# Patient Record
Sex: Female | Born: 1990 | Race: Black or African American | Hispanic: No | Marital: Single | Smoking: Former smoker
Health system: Southern US, Community
[De-identification: ages and names within clinical notes are randomized; demographics above are authoritative.]

## PROBLEM LIST (undated history)

## (undated) DIAGNOSIS — R87629 Unspecified abnormal cytological findings in specimens from vagina: Secondary | ICD-10-CM

## (undated) DIAGNOSIS — O2340 Unspecified infection of urinary tract in pregnancy, unspecified trimester: Secondary | ICD-10-CM

## (undated) DIAGNOSIS — A64 Unspecified sexually transmitted disease: Secondary | ICD-10-CM

## (undated) DIAGNOSIS — IMO0002 Reserved for concepts with insufficient information to code with codable children: Secondary | ICD-10-CM

## (undated) HISTORY — DX: Unspecified abnormal cytological findings in specimens from vagina: R87.629

## (undated) HISTORY — DX: Unspecified sexually transmitted disease: A64

## (undated) HISTORY — PX: OTHER SURGICAL HISTORY: SHX169

## (undated) HISTORY — DX: Reserved for concepts with insufficient information to code with codable children: IMO0002

## (undated) HISTORY — PX: BREAST BIOPSY: SHX20

---

## 2010-03-31 ENCOUNTER — Emergency Department (HOSPITAL_COMMUNITY)
Admission: EM | Admit: 2010-03-31 | Discharge: 2010-03-31 | Disposition: A | Discharge status: Home / Self Care | Payer: No Typology Code available for payment source | Attending: Emergency Medicine | Admitting: Emergency Medicine

## 2010-03-31 ENCOUNTER — Emergency Department (HOSPITAL_COMMUNITY): Payer: No Typology Code available for payment source

## 2010-03-31 DIAGNOSIS — R51 Headache: Secondary | ICD-10-CM | POA: Insufficient documentation

## 2010-03-31 DIAGNOSIS — S6390XA Sprain of unspecified part of unspecified wrist and hand, initial encounter: Secondary | ICD-10-CM | POA: Insufficient documentation

## 2010-03-31 DIAGNOSIS — M79609 Pain in unspecified limb: Secondary | ICD-10-CM | POA: Insufficient documentation

## 2010-03-31 DIAGNOSIS — S8000XA Contusion of unspecified knee, initial encounter: Secondary | ICD-10-CM | POA: Insufficient documentation

## 2010-03-31 DIAGNOSIS — Y929 Unspecified place or not applicable: Secondary | ICD-10-CM | POA: Insufficient documentation

## 2010-03-31 DIAGNOSIS — IMO0002 Reserved for concepts with insufficient information to code with codable children: Secondary | ICD-10-CM | POA: Insufficient documentation

## 2010-03-31 DIAGNOSIS — S0990XA Unspecified injury of head, initial encounter: Secondary | ICD-10-CM | POA: Insufficient documentation

## 2010-03-31 DIAGNOSIS — M25569 Pain in unspecified knee: Secondary | ICD-10-CM | POA: Insufficient documentation

## 2010-03-31 LAB — POCT PREGNANCY, URINE: Preg Test, Ur: NEGATIVE

## 2011-01-13 DIAGNOSIS — IMO0002 Reserved for concepts with insufficient information to code with codable children: Secondary | ICD-10-CM

## 2011-01-13 HISTORY — DX: Reserved for concepts with insufficient information to code with codable children: IMO0002

## 2011-01-13 HISTORY — PX: COLPOSCOPY: SHX161

## 2011-01-28 ENCOUNTER — Encounter: Payer: Self-pay | Admitting: Gynecology

## 2011-01-28 ENCOUNTER — Ambulatory Visit (INDEPENDENT_AMBULATORY_CARE_PROVIDER_SITE_OTHER): Payer: BC Managed Care – PPO | Admitting: Gynecology

## 2011-01-28 VITALS — BP 128/74 | Ht 67.0 in | Wt 185.0 lb

## 2011-01-28 DIAGNOSIS — R87810 Cervical high risk human papillomavirus (HPV) DNA test positive: Secondary | ICD-10-CM

## 2011-01-28 DIAGNOSIS — N912 Amenorrhea, unspecified: Secondary | ICD-10-CM

## 2011-01-28 DIAGNOSIS — N951 Menopausal and female climacteric states: Secondary | ICD-10-CM

## 2011-01-28 DIAGNOSIS — IMO0001 Reserved for inherently not codable concepts without codable children: Secondary | ICD-10-CM

## 2011-01-28 DIAGNOSIS — R87610 Atypical squamous cells of undetermined significance on cytologic smear of cervix (ASC-US): Secondary | ICD-10-CM

## 2011-01-28 NOTE — Patient Instructions (Signed)
Office will call you in several days with biopsy results and hormone levels. You do not hear from the office call in 5 days. Assuming biopsy shows low-grade changes and follow up in 6 months for annual exam and Pap smear

## 2011-01-28 NOTE — Progress Notes (Signed)
Patient ID: Brooke Howell, female   DOB: 1990-04-14, 21 y.o.   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 She apparently was told to repeat her Pap smear in 6 months But she was nervous with this and presents for a second opinion. They did a full exam and I had paperwork showing a complete physical exam with STD screening at that time. Patient has been using Depo-Provera historically for 7 years and is amenorrheic. She does note of last month or so some hot flashes that come and go throughout the day. No weight changes hair or skin changes or other symptoms.  Exam with Sherri chaperone present HEENT normal Abdomen soft nontender without masses guarding rebound organomegaly Pelvic external BUS vagina normal. Cervix normal. Uterus normal size axial midline mobile nontender. Adnexa without masses or tenderness  Colposcopy Acetic acid cleanse, adequate although external os somewhat stenotic with a faint acetowhite change at 12:00 transformation zone. Biopsy at 12:00 taken with subsequent ECC performed after gentle external os dilatation with anterior lip stabilization with a single tooth tenaculum.  Patient tolerated well  Physical Exam  Genitourinary:      Assessment and plan: 1. 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 Colposcopy consistent with HPV changes.  Patient will follow for biopsy results. Assuming negative for low-grade changes then we'll plan management with Pap smear in 6 months. She'll be due for her annual exam at that point and she will come in for that.  I had a long discussion with her about dysplasia, high-grade low-grade, progression  Regression and the positive high risk HPV. 2. Depo-Provera contraception. I discussed prolonged Depo-Provera contraception and the black box FDA warning with risk of loss of calcium from the bones and potential long-term  issues with osteopenia/osteoporosis. Options for DEXA baseline was also discussed or switching to a different form of contraception. The patient started interest he would prefer to continue on Depo-Provera and she understands and accepts the risks of long-term bone loss weight against the risk of getting pregnant now and the health risks associated with that as well as the social issues. 3. Hot flushes of short duration. We'll check baseline FSH TSH hCG. Assuming negative she'll monitor if they resolve will follow if they would persist I'll refer to internal medicine for evaluation.

## 2011-01-29 LAB — HCG, SERUM, QUALITATIVE: Preg, Serum: NEGATIVE

## 2011-05-29 ENCOUNTER — Encounter: Payer: Self-pay | Admitting: Family Medicine

## 2011-05-29 ENCOUNTER — Ambulatory Visit (INDEPENDENT_AMBULATORY_CARE_PROVIDER_SITE_OTHER): Payer: BC Managed Care – PPO | Admitting: Family Medicine

## 2011-05-29 VITALS — BP 127/86 | HR 98 | Temp 98.3°F | Resp 16 | Ht 68.0 in | Wt 188.8 lb

## 2011-05-29 DIAGNOSIS — L03119 Cellulitis of unspecified part of limb: Secondary | ICD-10-CM

## 2011-05-29 DIAGNOSIS — L02619 Cutaneous abscess of unspecified foot: Secondary | ICD-10-CM

## 2011-05-29 DIAGNOSIS — B353 Tinea pedis: Secondary | ICD-10-CM

## 2011-05-29 MED ORDER — KETOCONAZOLE 200 MG PO TABS: 200.0000 mg | ORAL_TABLET | Freq: Every day | ORAL | Status: DC

## 2011-05-29 MED ORDER — DOXYCYCLINE HYCLATE 100 MG PO TABS: 100.0000 mg | ORAL_TABLET | Freq: Two times a day (BID) | ORAL | Status: AC

## 2011-05-29 NOTE — Patient Instructions (Signed)
Athlete's Foot Athlete's foot (tinea pedis) is a fungal infection of the skin on the feet. It often occurs on the skin between the toes or underneath the toes. It can also occur on the soles of the feet. Athlete's foot is more likely to occur in hot, humid weather. Not washing your feet or changing your socks often enough can contribute to athlete's foot. The infection can spread from person to person (contagious). CAUSES Athlete's foot is caused by a fungus. This fungus thrives in warm, moist places. Most people get athlete's foot by sharing shower stalls, towels, and wet floors with an infected person. People with weakened immune systems, including those with diabetes, may be more likely to get athlete's foot. SYMPTOMS   Itchy areas between the toes or on the soles of the feet.   White, flaky, or scaly areas between the toes or on the soles of the feet.   Tiny, intensely itchy blisters between the toes or on the soles of the feet.   Tiny cuts on the skin. These cuts can develop a bacterial infection.   Thick or discolored toenails.  DIAGNOSIS  Your caregiver can usually tell what the problem is by doing a physical exam. Your caregiver may also take a skin sample from the rash area. The skin sample may be examined under a microscope, or it may be tested to see if fungus will grow in the sample. A sample may also be taken from your toenail for testing. TREATMENT  Over-the-counter and prescription medicines can be used to kill the fungus. These medicines are available as powders or creams. Your caregiver can suggest medicines for you. Fungal infections respond slowly to treatment. You may need to continue using your medicine for several weeks. PREVENTION   Do not share towels.   Wear sandals in wet areas, such as shared locker rooms and shared showers.   Keep your feet dry. Wear shoes that allow air to circulate. Wear cotton or wool socks.  HOME CARE INSTRUCTIONS   Take medicines as  directed by your caregiver. Do not use steroid creams on athlete's foot.   Keep your feet clean and cool. Wash your feet daily and dry them thoroughly, especially between your toes.   Change your socks every day. Wear cotton or wool socks. In hot climates, you may need to change your socks 2 to 3 times per day.   Wear sandals or canvas tennis shoes with good air circulation.   If you have blisters, soak your feet in Burow's solution or Epsom salts for 20 to 30 minutes, 2 times a day to dry out the blisters. Make sure you dry your feet thoroughly afterward.  SEEK MEDICAL CARE IF:   You have a fever.   You have swelling, soreness, warmth, or redness in your foot.   You are not getting better after 7 days of treatment.   You are not completely cured after 30 days.   You have any problems caused by your medicines.  MAKE SURE YOU:   Understand these instructions.   Will watch your condition.   Will get help right away if you are not doing well or get worse.  Document Released: 12/27/1999 Document Revised: 12/18/2010 Document Reviewed: 10/17/2010 Exit[employer] Patient Information 2012 [employer]. 

## 2011-05-29 NOTE — Progress Notes (Signed)
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  Now the foot has gotten much worse.  Objective: The fifth toe webspace on the right is crusted with white exudate it is very tender. There is surrounding erythema and swelling in the distal foot on the lateral side. She has good range of motion.  Assessment: Tinea pedis with bacterial cellulitis.  Plan: Nizoral 200 daily, doxycycline 100 twice a day x7 days. Patient was instructed to return in 48 hours if pain is and swelling are not significantly improved.

## 2011-06-07 ENCOUNTER — Telehealth: Payer: Self-pay

## 2011-06-07 NOTE — Telephone Encounter (Signed)
PATIENT WAS SEEN LAST Friday.  SHE WAS GIVEN AN ANTIBIOTIC FOR INFECTION IN HER FOOT AND NOW SHE HAS A HOLE IN BETWEEN HER TOES.  WANTS TO KNOW IF IT IS SUPPOSED TO BE LIKE THAT?

## 2011-06-09 NOTE — Telephone Encounter (Signed)
LMOM (OK per HIPPA) advising pt to RTC to have her wound evaluated. Asked for CB w/any further ?s.

## 2011-07-31 ENCOUNTER — Ambulatory Visit (INDEPENDENT_AMBULATORY_CARE_PROVIDER_SITE_OTHER): Payer: BC Managed Care – PPO | Admitting: Gynecology

## 2011-07-31 ENCOUNTER — Other Ambulatory Visit (HOSPITAL_COMMUNITY)
Admission: RE | Admit: 2011-07-31 | Discharge: 2011-07-31 | Disposition: A | Discharge status: Home / Self Care | Payer: BC Managed Care – PPO | Source: Ambulatory Visit | Attending: Gynecology | Admitting: Gynecology

## 2011-07-31 ENCOUNTER — Encounter: Payer: Self-pay | Admitting: Gynecology

## 2011-07-31 VITALS — BP 120/76 | Ht 66.0 in | Wt 187.0 lb

## 2011-07-31 DIAGNOSIS — N898 Other specified noninflammatory disorders of vagina: Secondary | ICD-10-CM

## 2011-07-31 DIAGNOSIS — Z01419 Encounter for gynecological examination (general) (routine) without abnormal findings: Secondary | ICD-10-CM

## 2011-07-31 DIAGNOSIS — B373 Candidiasis of vulva and vagina: Secondary | ICD-10-CM

## 2011-07-31 DIAGNOSIS — Z113 Encounter for screening for infections with a predominantly sexual mode of transmission: Secondary | ICD-10-CM

## 2011-07-31 DIAGNOSIS — R87811 Vaginal high risk human papillomavirus (HPV) DNA test positive: Secondary | ICD-10-CM

## 2011-07-31 DIAGNOSIS — A64 Unspecified sexually transmitted disease: Secondary | ICD-10-CM | POA: Insufficient documentation

## 2011-07-31 DIAGNOSIS — IMO0002 Reserved for concepts with insufficient information to code with codable children: Secondary | ICD-10-CM | POA: Insufficient documentation

## 2011-07-31 LAB — WET PREP FOR TRICH, YEAST, CLUE: Clue Cells Wet Prep HPF POC: NONE SEEN

## 2011-07-31 MED ORDER — FLUCONAZOLE 150 MG PO TABS: 150.0000 mg | ORAL_TABLET | Freq: Once | ORAL | Status: AC

## 2011-07-31 NOTE — Addendum Note (Signed)
Addended by: Dayna Barker on: 07/31/2011 09:49 AM   Modules accepted: Orders

## 2011-07-31 NOTE — Progress Notes (Signed)
Brooke Howell [birth year] [id number]        21 y.o.  G1P0010 for annual exam.  Several issues noted below.  Past medical history,surgical history, medications, allergies, family history and social history were all reviewed and documented in the EPIC chart. ROS:  Was performed and pertinent positives and negatives are included in the history.  Exam: Brooke Howell assistant Filed Vitals:   07/31/11 0847  BP: 120/76  Height: 5\' 6"  (1.676 m)  Weight: 187 lb (84.823 kg)   General appearance  Normal Skin grossly normal Head/Neck normal with no cervical or supraclavicular adenopathy thyroid normal Lungs  clear Cardiac RR, without RMG Abdominal  soft, nontender, without masses, organomegaly or hernia Breasts  examined lying and sitting without masses, retractions, discharge or axillary adenopathy. Pelvic  Ext/BUS/vagina  normal with white discharge  Cervix  normal Pap done  Uterus  anteverted, normal size, shape and contour, midline and mobile nontender   Adnexa  Without masses or tenderness    Anus and perineum  normal    Assessment/Plan:  21 y.o. G1P0010 female for annual exam.   1. 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 Patient has history of ascus Pap smear with positive high-risk HPV at the health department. Colposcopy with biopsies here January 2013 showed koilocytotic atypia with a negative ECC.  Pap today. Assuming normal or low-grade them we'll plan expectant management with repeat in one year. 2. Vaginal discharge/irritation. Patient notes the last several months vaginal irritation particularly with cleaning with soap. Exam shows abundant white discharge. Wet prep positive for yeast. We'll treat with Diflucan 150 mg 1 dose now and then repeat in 2-3 days. 3. Contraception. Patient on Depo-Provera. She has been for 7 years. We again discussed the FDA black box warning/calcium balance and alternatives to include pill, patch, ring, continuing Depo-Provera, Implanon, IUD. Patient's thinking of Implanon. I gave  her literature on this. If she does decide to do so I asked her to have it placed while she's still within her Depo-Provera window which would end mid-August.  She may consider doing this at the health department where she gets her Depo-Provera. 4. STD screening. Past history of Chlamydia. GC Chlamydia screen done. Need for condoms regardless of birth control method reviewed. 5. Breast health. SBE monthly reviewed. 6. Health maintenance. No other lab work was done today as she is low risk historically without symptoms.    Brooke Lords MD, 9:16 AM 07/31/2011

## 2011-07-31 NOTE — Patient Instructions (Signed)
Take Diflucan one pill now and repeat in 2-3 days. Follow up if vaginal irritation continues. Decide if you want to proceed with Implanon. If you do make sure you do this before your Depo-Provera runs out. Follow up for Pap smear results. Follow up in one year for annual exam.

## 2011-08-01 LAB — GC/CHLAMYDIA PROBE AMP, GENITAL
Chlamydia, DNA Probe: NEGATIVE
GC Probe Amp, Genital: NEGATIVE

## 2011-10-13 ENCOUNTER — Encounter (HOSPITAL_COMMUNITY): Payer: Self-pay | Admitting: *Deleted

## 2011-10-13 ENCOUNTER — Emergency Department (HOSPITAL_COMMUNITY)
Admission: EM | Admit: 2011-10-13 | Discharge: 2011-10-13 | Disposition: A | Discharge status: Home / Self Care | Payer: BC Managed Care – PPO | Attending: Emergency Medicine | Admitting: Emergency Medicine

## 2011-10-13 ENCOUNTER — Emergency Department (HOSPITAL_COMMUNITY): Payer: BC Managed Care – PPO

## 2011-10-13 DIAGNOSIS — W1809XA Striking against other object with subsequent fall, initial encounter: Secondary | ICD-10-CM | POA: Insufficient documentation

## 2011-10-13 DIAGNOSIS — F17200 Nicotine dependence, unspecified, uncomplicated: Secondary | ICD-10-CM | POA: Insufficient documentation

## 2011-10-13 DIAGNOSIS — S60229A Contusion of unspecified hand, initial encounter: Secondary | ICD-10-CM

## 2011-10-13 DIAGNOSIS — S60219A Contusion of unspecified wrist, initial encounter: Secondary | ICD-10-CM | POA: Insufficient documentation

## 2011-10-13 DIAGNOSIS — Z91040 Latex allergy status: Secondary | ICD-10-CM | POA: Insufficient documentation

## 2011-10-13 DIAGNOSIS — Z880 Allergy status to penicillin: Secondary | ICD-10-CM | POA: Insufficient documentation
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
## 2011-10-13 NOTE — ED Notes (Signed)
The pt is c/o rt wrist pain since yesterday when she fell.  Painful rt wrist

## 2012-01-14 ENCOUNTER — Encounter: Payer: Self-pay | Admitting: Gynecology

## 2012-01-14 ENCOUNTER — Ambulatory Visit (INDEPENDENT_AMBULATORY_CARE_PROVIDER_SITE_OTHER): Payer: BC Managed Care – PPO | Admitting: Gynecology

## 2012-01-14 VITALS — Temp 97.7°F

## 2012-01-14 DIAGNOSIS — B9789 Other viral agents as the cause of diseases classified elsewhere: Secondary | ICD-10-CM

## 2012-01-14 DIAGNOSIS — B349 Viral infection, unspecified: Secondary | ICD-10-CM

## 2012-01-14 DIAGNOSIS — Z309 Encounter for contraceptive management, unspecified: Secondary | ICD-10-CM

## 2012-01-14 MED ORDER — IBUPROFEN 800 MG PO TABS: 800.0000 mg | ORAL_TABLET | Freq: Three times a day (TID) | ORAL | Status: DC | PRN

## 2012-01-14 NOTE — Patient Instructions (Signed)
Schedule Nexplanon before you're due for your next Depo-Provera shot. Take Motrin 800 mg every 8 hours for body aches. Push fluids so that you're voiding every one to 2 hours. Take over-the-counter decongestants as needed.

## 2012-01-14 NOTE — Progress Notes (Signed)
Patient presents with 2 issues: 1. New onset of myalgias/arthralgias over the last 2 weeks generalized to involve lower and upper extremities. Now with URI congestion and low grade temp at home. No coughing or sputum. No diarrhea constipation or urinary symptoms. Other family members are sick. 2. Contraceptive management. Patient decided she must proceed with Nexplanon. She is actively on Depo-Provera and thinks her next shot is due in February.  Exam was kim assistant Temperature 97.7 HEENT normal Neck supple without adenopathy. No evidence of pharyngitis. Lungs clear to auscultation. Cardiac regular rate no rubs murmurs or gallops Abdomen soft nontender without masses guarding rebound organomegaly.  Assessment and plan: 1. Viral syndrome. Treat with Motrin 800 mg 3 times a day when necessary #30 with 1 refill. OTC decongestants.  push fluids.  Follow up if symptoms persist or recur. 2. Currently on Depo-Provera desires Nexplanon. We'll schedule before her next Depo-Provera shot is due to be within that window. I reviewed which involves the insertional process and the risks/side effects. Risks of bleeding, infection, neurovascular damage with permanent sequela  and failure with pregnancy reviewed.  Irregular bleeding in the need for a separate procedure to remove in 3 years discussed. Patient will schedule at her convenience.

## 2012-01-18 ENCOUNTER — Telehealth: Payer: Self-pay | Admitting: Gynecology

## 2012-01-18 ENCOUNTER — Other Ambulatory Visit: Payer: Self-pay | Admitting: Gynecology

## 2012-01-18 DIAGNOSIS — Z3049 Encounter for surveillance of other contraceptives: Secondary | ICD-10-CM

## 2012-01-18 NOTE — Telephone Encounter (Signed)
LM on pt cell that the Nexplanon is covered with $35.00 copay only. She already has appt for insertion with TF for 02/19/12/WL

## 2012-02-19 ENCOUNTER — Ambulatory Visit (INDEPENDENT_AMBULATORY_CARE_PROVIDER_SITE_OTHER): Payer: BC Managed Care – PPO | Admitting: Gynecology

## 2012-02-19 ENCOUNTER — Encounter: Payer: Self-pay | Admitting: Gynecology

## 2012-02-19 DIAGNOSIS — Z30017 Encounter for initial prescription of implantable subdermal contraceptive: Secondary | ICD-10-CM

## 2012-02-19 DIAGNOSIS — Z3046 Encounter for surveillance of implantable subdermal contraceptive: Secondary | ICD-10-CM

## 2012-02-19 MED ORDER — ETONOGESTREL 68 MG ~~LOC~~ IMPL: 68.0000 mg | DRUG_IMPLANT | Freq: Once | SUBCUTANEOUS | Status: DC

## 2012-02-19 NOTE — Progress Notes (Signed)
Patient presents for Nexplanon insertion. She previously has been counseled per July 2013 note.   She currently is within her Depo-Provera window due to receive her next shot in another week or so. I reviewed Nexplanon insertional process and the side effects/risks. I reviewed irregular bleeding, insertion site infections, underlying neurovascular damage with permanent sequela, migration of the implant making removal difficult requiring surgery, the need to have it removed in 3 years under a separate procedure and lastly the risk of failure with pregnancy. Patient is right-handed. She has read through the consent form and signed this.   Procedure with Selena Batten assistant: Left upper arm examined and and marked according to manufacturer's recommendation. The insertion site was cleansed with Betadine solution and the insertional tract infiltrated with 1% lidocaine. The Nexplanon was placed according to manufacturer's recommendation without difficulty. The skin defect was closed with a Steri-Strip. The patient palpated the rod. A pressure dressing was applied and postoperative instructions give her.   Lot number:[id number] expiration 04/2014

## 2012-02-19 NOTE — Addendum Note (Signed)
Addended by: Dayna Barker on: 02/19/2012 02:35 PM   Modules accepted: Orders

## 2012-02-19 NOTE — Patient Instructions (Signed)

## 2012-05-25 ENCOUNTER — Ambulatory Visit: Payer: BC Managed Care – PPO | Admitting: Gynecology

## 2012-05-27 ENCOUNTER — Ambulatory Visit (INDEPENDENT_AMBULATORY_CARE_PROVIDER_SITE_OTHER): Payer: BC Managed Care – PPO | Admitting: Gynecology

## 2012-05-27 ENCOUNTER — Telehealth: Payer: Self-pay | Admitting: *Deleted

## 2012-05-27 ENCOUNTER — Encounter: Payer: Self-pay | Admitting: Gynecology

## 2012-05-27 DIAGNOSIS — N760 Acute vaginitis: Secondary | ICD-10-CM

## 2012-05-27 DIAGNOSIS — B9689 Other specified bacterial agents as the cause of diseases classified elsewhere: Secondary | ICD-10-CM

## 2012-05-27 DIAGNOSIS — A499 Bacterial infection, unspecified: Secondary | ICD-10-CM

## 2012-05-27 DIAGNOSIS — N949 Unspecified condition associated with female genital organs and menstrual cycle: Secondary | ICD-10-CM

## 2012-05-27 DIAGNOSIS — Z131 Encounter for screening for diabetes mellitus: Secondary | ICD-10-CM

## 2012-05-27 DIAGNOSIS — N898 Other specified noninflammatory disorders of vagina: Secondary | ICD-10-CM

## 2012-05-27 LAB — WET PREP FOR TRICH, YEAST, CLUE
Trich, Wet Prep: NONE SEEN
Yeast Wet Prep HPF POC: NONE SEEN

## 2012-05-27 LAB — HEMOGLOBIN A1C
Hgb A1c MFr Bld: 5.6 % (ref <5.7)
Mean Plasma Glucose: 114 mg/dL (ref <117)

## 2012-05-27 MED ORDER — CLINDAMYCIN PHOSPHATE 2 % VA CREA: 1.0000 | TOPICAL_CREAM | Freq: Every day | VAGINAL | Status: DC

## 2012-05-27 NOTE — Telephone Encounter (Signed)
Pt was seen today and called requesting a Diflucan Rx. She believes that after taking the cleocin cream she will have a yeast infection. Please advise

## 2012-05-27 NOTE — Telephone Encounter (Signed)
Tell patient that the Cleocin cream is not known for doing this. If for some reason she would seem to develop this afterwards and she can call but I would not take it prophylactically as I think all of her symptoms are due to the bacterial overgrowth and not yeast.

## 2012-05-27 NOTE — Telephone Encounter (Signed)
Pt informed with the below note. 

## 2012-05-27 NOTE — Progress Notes (Signed)
Patient presents with two-week history of vaginal discharge with odor. No itching. No urinary symptoms such as dysuria frequency urgency.  Exam with Brooke Howell assistant External BUS vagina with white discharge. Cervix normal. Uterus normal sized mobile nontender. Adnexa without masses or tenderness.  Assessment and plan: Symptoms, exam and wet prep consistent with bacterial vaginosis. Options for treatment discussed and patient elects for Cleocin vaginal cream nightly. GC chlamydia screen was done. She did ask if she could be screened for diabetes due to a very strong family history had ordered a glucose and a hemoglobin A1c. Patient is due for her annual in July and I reminded her to schedule that appointment.

## 2012-05-27 NOTE — Patient Instructions (Signed)
Use the vaginal cream nightly for 7 days. Followup if her symptoms persist, worsen or recur

## 2012-05-31 ENCOUNTER — Other Ambulatory Visit: Payer: Self-pay | Admitting: Gynecology

## 2012-05-31 DIAGNOSIS — R739 Hyperglycemia, unspecified: Secondary | ICD-10-CM

## 2012-06-01 ENCOUNTER — Other Ambulatory Visit: Payer: BC Managed Care – PPO

## 2012-06-01 DIAGNOSIS — R739 Hyperglycemia, unspecified: Secondary | ICD-10-CM

## 2012-06-01 LAB — GLUCOSE, FASTING: Glucose, Fasting: 98 mg/dL (ref 70–99)

## 2012-10-11 ENCOUNTER — Encounter: Payer: BC Managed Care – PPO | Admitting: Gynecology

## 2012-10-17 ENCOUNTER — Encounter (HOSPITAL_COMMUNITY): Payer: Self-pay | Admitting: Emergency Medicine

## 2012-10-17 ENCOUNTER — Emergency Department (INDEPENDENT_AMBULATORY_CARE_PROVIDER_SITE_OTHER)
Admission: EM | Admit: 2012-10-17 | Discharge: 2012-10-17 | Disposition: A | Discharge status: Home / Self Care | Payer: BC Managed Care – PPO | Source: Home / Self Care | Attending: Family Medicine | Admitting: Family Medicine

## 2012-10-17 DIAGNOSIS — B353 Tinea pedis: Secondary | ICD-10-CM

## 2012-10-17 MED ORDER — TERBINAFINE HCL 250 MG PO TABS: 250.0000 mg | ORAL_TABLET | Freq: Every day | ORAL | Status: DC

## 2012-10-17 NOTE — ED Notes (Signed)
C/o left foot pain. Pain btwn fourth and fifth toe. Pain started on Thursday after being on feet all day. Area btwn toe is white/raw. Same with the right foot but no pain.  Pt has been doing epsom salt soaks of both feet with no relief.

## 2012-10-17 NOTE — ED Notes (Deleted)
C/o left ankle pain. States walking at park around noon and stepped on acorns rolling left ankle. Pain with walking. Pt has used ice and elevation with no relief.  States seems to be getting worse.
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
## 2012-10-25 ENCOUNTER — Encounter: Payer: BC Managed Care – PPO | Admitting: Gynecology

## 2012-12-21 ENCOUNTER — Ambulatory Visit (INDEPENDENT_AMBULATORY_CARE_PROVIDER_SITE_OTHER): Payer: BC Managed Care – PPO | Admitting: Gynecology

## 2012-12-21 ENCOUNTER — Encounter: Payer: Self-pay | Admitting: Gynecology

## 2012-12-21 ENCOUNTER — Other Ambulatory Visit (HOSPITAL_COMMUNITY)
Admission: RE | Admit: 2012-12-21 | Discharge: 2012-12-21 | Disposition: A | Discharge status: Home / Self Care | Payer: BC Managed Care – PPO | Source: Ambulatory Visit | Attending: Gynecology | Admitting: Gynecology

## 2012-12-21 VITALS — BP 120/74 | Ht 68.0 in | Wt 218.0 lb

## 2012-12-21 DIAGNOSIS — Z01419 Encounter for gynecological examination (general) (routine) without abnormal findings: Secondary | ICD-10-CM

## 2012-12-21 DIAGNOSIS — K5900 Constipation, unspecified: Secondary | ICD-10-CM

## 2012-12-21 DIAGNOSIS — Z113 Encounter for screening for infections with a predominantly sexual mode of transmission: Secondary | ICD-10-CM

## 2012-12-21 DIAGNOSIS — N898 Other specified noninflammatory disorders of vagina: Secondary | ICD-10-CM

## 2012-12-21 DIAGNOSIS — Z1151 Encounter for screening for human papillomavirus (HPV): Secondary | ICD-10-CM | POA: Insufficient documentation

## 2012-12-21 DIAGNOSIS — N949 Unspecified condition associated with female genital organs and menstrual cycle: Secondary | ICD-10-CM

## 2012-12-21 DIAGNOSIS — N912 Amenorrhea, unspecified: Secondary | ICD-10-CM

## 2012-12-21 DIAGNOSIS — Z309 Encounter for contraceptive management, unspecified: Secondary | ICD-10-CM

## 2012-12-21 LAB — COMPREHENSIVE METABOLIC PANEL
ALT: 16 U/L (ref 0–35)
Alkaline Phosphatase: 80 U/L (ref 39–117)
CO2: 23 mEq/L (ref 19–32)
Calcium: 9.6 mg/dL (ref 8.4–10.5)
Glucose, Bld: 98 mg/dL (ref 70–99)
Potassium: 3.8 mEq/L (ref 3.5–5.3)
Sodium: 135 mEq/L (ref 135–145)
Total Bilirubin: 0.3 mg/dL (ref 0.3–1.2)

## 2012-12-21 LAB — CBC WITH DIFFERENTIAL/PLATELET
Basophils Relative: 0 % (ref 0–1)
Eosinophils Absolute: 0.1 10*3/uL (ref 0.0–0.7)
Hemoglobin: 12.6 g/dL (ref 12.0–15.0)
Lymphs Abs: 2.8 10*3/uL (ref 0.7–4.0)
Monocytes Relative: 6 % (ref 3–12)
Neutro Abs: 3.1 10*3/uL (ref 1.7–7.7)
Neutrophils Relative %: 48 % (ref 43–77)
Platelets: 290 10*3/uL (ref 150–400)
RBC: 4.25 MIL/uL (ref 3.87–5.11)
WBC: 6.4 10*3/uL (ref 4.0–10.5)

## 2012-12-21 LAB — LIPID PANEL: LDL Cholesterol: 121 mg/dL — ABNORMAL HIGH (ref 0–99)

## 2012-12-21 LAB — TSH: TSH: 2.436 u[IU]/mL (ref 0.350–4.500)

## 2012-12-21 LAB — WET PREP FOR TRICH, YEAST, CLUE
Trich, Wet Prep: NONE SEEN
WBC, Wet Prep HPF POC: NONE SEEN
Yeast Wet Prep HPF POC: NONE SEEN

## 2012-12-21 MED ORDER — CLINDAMYCIN PHOSPHATE 2 % VA CREA: 1.0000 | TOPICAL_CREAM | Freq: Every day | VAGINAL | Status: DC

## 2012-12-21 NOTE — Patient Instructions (Signed)
Office will contact you with lab results. Use the vaginal cream nightly for 7 days. Followup if the vaginal symptoms continue, worsen or recur. Followup in one year for annual exam.

## 2012-12-21 NOTE — Progress Notes (Signed)
Brooke Howell 1990/09/19 [id number]        22 y.o.  G1P0010 for annual exam.  Several issues noted below.  Past medical history,surgical history, problem list, medications, allergies, family history and social history were all reviewed and documented in the EPIC chart.  ROS:  Performed and pertinent positives and negatives are included in the history, assessment and plan .  Exam: [name] assistant Filed Vitals:   12/21/12 1039  BP: 120/74  Height: 5\' 8"  (1.727 m)  Weight: 218 lb (98.884 kg)   General appearance  Normal Skin grossly normal. Nexplanon rod palpated left upper arm normal Head/Neck normal with no cervical or supraclavicular adenopathy thyroid normal Lungs  clear Cardiac RR, without RMG Abdominal  soft, nontender, without masses, organomegaly or hernia Breasts  examined lying and sitting without masses, retractions, discharge or axillary adenopathy. Pelvic  Ext/BUS/vagina  Normal with white discharge  Cervix  Normal. GC/Chlamydia, Pap/HPV done  Uterus  anteverted, normal size, shape and contour, midline and mobile nontender   Adnexa  Without masses or tenderness    Anus and perineum  Normal   Rectovaginal  Normal sphincter tone without palpated masses or tenderness.    Assessment/Plan:  22 y.o. G55P0010 female for annual exam, amenorrheic, nexplanon.   1. Nexplanon 02/2012. Patient has remained amenorrheic. She was amenorrheic prior to the placement due to Depo-Provera. Does note some breast tenderness. Check baseline labs for completeness to include FSH TSH prolactin and hCG. Nexplanon rod palpated left upper arm. She does note sometimes it pinches or pokes her. We'll continue to monitor at present. Only alternative if it does continue would be to remove it but she does not want this. 2. Vaginal odor times one week. No discharge. Wet prep consistent with bacterial vaginosis. Will treat with Cleocin vaginal cream at her choice. Followup if symptoms persist, worsen or recur.  GC/chlamydia screening done. 3. Pap smear 2013.  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 Colposcopy with biopsies here January 2013 showed koilocytotic atypia with a negative ECC. Followup Pap smear July 2013 negative. Pap smear/HPV done today. 4. Breast health. SBE monthly reviewed. 5. Complaints of hunger eating frequently. Check baseline labs to include comprehensive metabolic panel with TSH. More attention to diet and regularity of eating discussed. 6. Constipation. Intermittent constipation. Recommended daily Metamucil/FiberCon for now with pushing fluids to see if we can't regulate her bowel movements. Consider gastroenterology referral if continues. 7. Health maintenance. A sign CBC comprehensive metabolic panel lipid profile urinalysis TSH FSH prolactin hCG GC/Chlamydia, Pap smear/HPV ordered.   Note: This document was prepared with digital dictation and possible smart phrase technology. Any transcriptional errors that result from this process are unintentional.   [name] MD, 11:11 AM 12/21/2012

## 2012-12-21 NOTE — Addendum Note (Signed)
Addended by: Dayna Barker on: 12/21/2012 11:34 AM   Modules accepted: Orders

## 2012-12-22 LAB — URINALYSIS W MICROSCOPIC + REFLEX CULTURE
Hgb urine dipstick: NEGATIVE
Leukocytes, UA: NEGATIVE
Nitrite: NEGATIVE
Protein, ur: NEGATIVE mg/dL
Urobilinogen, UA: 0.2 mg/dL (ref 0.0–1.0)
pH: 5.5 (ref 5.0–8.0)

## 2012-12-22 LAB — GC/CHLAMYDIA PROBE AMP: CT Probe RNA: NEGATIVE

## 2012-12-22 LAB — FOLLICLE STIMULATING HORMONE: FSH: 6.8 m[IU]/mL

## 2012-12-22 LAB — HCG, SERUM, QUALITATIVE: Preg, Serum: NEGATIVE

## 2013-01-03 ENCOUNTER — Telehealth: Payer: Self-pay | Admitting: *Deleted

## 2013-01-03 MED ORDER — FLUCONAZOLE 150 MG PO TABS: 150.0000 mg | ORAL_TABLET | Freq: Once | ORAL | Status: DC

## 2013-01-03 NOTE — Telephone Encounter (Signed)
Pt informed with the below note, rx sent. 

## 2013-01-03 NOTE — Telephone Encounter (Signed)
Diflucan 150 mg x1 okay 

## 2013-01-03 NOTE — Telephone Encounter (Signed)
Pt was seen on 12/21/12 treated with Cleocin vaginal cream, pt now c/o itching, white discharge. Pt is requesting diflucan pills. Please advise

## 2013-03-09 ENCOUNTER — Ambulatory Visit: Payer: BC Managed Care – PPO | Admitting: Gynecology

## 2013-03-29 ENCOUNTER — Ambulatory Visit (INDEPENDENT_AMBULATORY_CARE_PROVIDER_SITE_OTHER): Payer: BC Managed Care – PPO | Admitting: Women's Health

## 2013-03-29 ENCOUNTER — Encounter: Payer: Self-pay | Admitting: Women's Health

## 2013-03-29 DIAGNOSIS — R32 Unspecified urinary incontinence: Secondary | ICD-10-CM

## 2013-03-29 DIAGNOSIS — N898 Other specified noninflammatory disorders of vagina: Secondary | ICD-10-CM

## 2013-03-29 DIAGNOSIS — A499 Bacterial infection, unspecified: Secondary | ICD-10-CM

## 2013-03-29 DIAGNOSIS — N760 Acute vaginitis: Secondary | ICD-10-CM

## 2013-03-29 DIAGNOSIS — B9689 Other specified bacterial agents as the cause of diseases classified elsewhere: Secondary | ICD-10-CM | POA: Insufficient documentation

## 2013-03-29 LAB — WET PREP FOR TRICH, YEAST, CLUE: TRICH WET PREP: NONE SEEN

## 2013-03-29 LAB — URINALYSIS W MICROSCOPIC + REFLEX CULTURE
Bilirubin Urine: NEGATIVE
CASTS: NONE SEEN
Crystals: NONE SEEN
Glucose, UA: NEGATIVE mg/dL
Hgb urine dipstick: NEGATIVE
Ketones, ur: NEGATIVE mg/dL
Nitrite: NEGATIVE
PH: 5.5 (ref 5.0–8.0)
Protein, ur: NEGATIVE mg/dL
RBC / HPF: NONE SEEN RBC/hpf (ref <3)
Urobilinogen, UA: 0.2 mg/dL (ref 0.0–1.0)

## 2013-03-29 MED ORDER — TERCONAZOLE 0.8 % VA CREA: 1.0000 | TOPICAL_CREAM | Freq: Every day | VAGINAL | Status: DC

## 2013-03-29 MED ORDER — METRONIDAZOLE 500 MG PO TABS: 500.0000 mg | ORAL_TABLET | Freq: Two times a day (BID) | ORAL | Status: DC

## 2013-03-29 NOTE — Patient Instructions (Signed)
Bacterial Vaginosis Bacterial vaginosis is an infection of the vagina. It happens when too many of certain germs (bacteria) grow in the vagina. HOME CARE  Take your medicine as told by your doctor.  Finish your medicine even if you start to feel better.  Do not have sex until you finish your medicine and are better.  Tell your sex partner that you have an infection. They should see their doctor for treatment.  Practice safe sex. Use condoms. Have only one sex partner. GET HELP IF:  You are not getting better after 3 days of treatment.  You have more grey fluid (discharge) coming from your vagina than before.  You have more pain than before.  You have a fever. MAKE SURE YOU:   Understand these instructions.  Will watch your condition.  Will get help right away if you are not doing well or get worse. Document Released: 10/08/2007 Document Revised: 10/19/2012 Document Reviewed: 08/10/2012 ExitCare Patient Information 2014 [employer].  

## 2013-03-29 NOTE — Progress Notes (Signed)
Patient ID: [name], female   DOB: March 07, 1990, 23 y.o.   MRN: [id number] Presents with complaint of increased discharge with odor and vaginal irritation. Reports frequent bacterial infections, back clear with medication but return. States will leak urine when bladder is full. Works as a LawyerCNA on a very busy unit. Same partner/negative STD screen. Amenorrheic/nexplanon.  Exam: Appears well. External genitalia slightly erythematous and 4 days, speculum exam copious white to yellow discharge with odor noted. Wet prep positive for yeast, amines, clues, and TNTC bacteria. Bimanual no CMT or adnexal fullness or tenderness.  UA: Small leukocytes, 7-10 WBCs, few bacteria.  Recurrent Bacteria vaginosis  Yeast  Plan: Urine culture pending. Encouraged more frequent bathroom breaks.  Flagyl 500 twice daily for 7 days, alcohol precautions reviewed. Terazol 3 one applicator at bedtime x3 prescription, proper use given and reviewed. Boric acid gelcaps twice weekly after infection clears. Instructed to call or return if no relief of symptoms.

## 2013-03-30 LAB — URINE CULTURE
COLONY COUNT: NO GROWTH
ORGANISM ID, BACTERIA: NO GROWTH

## 2013-04-18 ENCOUNTER — Encounter: Payer: Self-pay | Admitting: Women's Health

## 2013-04-18 ENCOUNTER — Ambulatory Visit (INDEPENDENT_AMBULATORY_CARE_PROVIDER_SITE_OTHER): Payer: BC Managed Care – PPO | Admitting: Women's Health

## 2013-04-18 DIAGNOSIS — B3731 Acute candidiasis of vulva and vagina: Secondary | ICD-10-CM

## 2013-04-18 DIAGNOSIS — N898 Other specified noninflammatory disorders of vagina: Secondary | ICD-10-CM

## 2013-04-18 DIAGNOSIS — B373 Candidiasis of vulva and vagina: Secondary | ICD-10-CM

## 2013-04-18 LAB — WET PREP FOR TRICH, YEAST, CLUE
Clue Cells Wet Prep HPF POC: NONE SEEN
TRICH WET PREP: NONE SEEN

## 2013-04-18 MED ORDER — FLUCONAZOLE 100 MG PO TABS: ORAL_TABLET | ORAL | Status: DC

## 2013-04-18 NOTE — Progress Notes (Signed)
Patient ID: [name], female   DOB: [birth year], 23 y.o.   MRN: [id number] Presents with complaint of vaginal itching, irritation and questions fidelity of past partner. Currently not sexually active. Amenorrheic with nexplanon. Currently being treated for a UTI with cipro. Has had problems with recurrent yeast, recently treated with Terazol without relief.  Exam: Appears well. External genitalia erythematous at introitus, speculum exam copious white curdy discharge noted wet prep positive for yeast. GC/Chlamydia culture taken. Bimanual no CMT or adnexal fullness or tenderness.  Recurrent Yeast vaginitis STD screen  Plan: Diflucan 100 by mouth daily for 7 days then weekly as needed. Instructed to call if no relief of symptoms. Instructed to finish Cipro for UTI as prescribed. Boric acid gel caps has a prescription for, will use after symptoms resolve twice weekly. HIV, hep B, C., RPR, GC/Chlamydia culture pending. Condoms encouraged if become sexually active.

## 2013-04-18 NOTE — Patient Instructions (Signed)
Monilial Vaginitis  Vaginitis in a soreness, swelling and redness (inflammation) of the vagina and vulva. Monilial vaginitis is not a sexually transmitted infection.  CAUSES   Yeast vaginitis is caused by yeast (candida) that is normally found in your vagina. With a yeast infection, the candida has overgrown in number to a point that upsets the chemical balance.  SYMPTOMS   · White, thick vaginal discharge.  · Swelling, itching, redness and irritation of the vagina and possibly the lips of the vagina (vulva).  · Burning or painful urination.  · Painful intercourse.  DIAGNOSIS   Things that may contribute to monilial vaginitis are:  · Postmenopausal and virginal states.  · Pregnancy.  · Infections.  · Being tired, sick or stressed, especially if you had monilial vaginitis in the past.  · Diabetes. Good control will help lower the chance.  · Birth control pills.  · Tight fitting garments.  · Using bubble bath, feminine sprays, douches or deodorant tampons.  · Taking certain medications that kill germs (antibiotics).  · Sporadic recurrence can occur if you become ill.  TREATMENT   Your caregiver will give you medication.  · There are several kinds of anti monilial vaginal creams and suppositories specific for monilial vaginitis. For recurrent yeast infections, use a suppository or cream in the vagina 2 times a week, or as directed.  · Anti-monilial or steroid cream for the itching or irritation of the vulva may also be used. Get your caregiver's permission.  · Painting the vagina with methylene blue solution may help if the monilial cream does not work.  · Eating yogurt may help prevent monilial vaginitis.  HOME CARE INSTRUCTIONS   · Finish all medication as prescribed.  · Do not have sex until treatment is completed or after your caregiver tells you it is okay.  · Take warm sitz baths.  · Do not douche.  · Do not use tampons, especially scented ones.  · Wear cotton underwear.  · Avoid tight pants and panty  hose.  · Tell your sexual partner that you have a yeast infection. They should go to their caregiver if they have symptoms such as mild rash or itching.  · Your sexual partner should be treated as well if your infection is difficult to eliminate.  · Practice safer sex. Use condoms.  · Some vaginal medications cause latex condoms to fail. Vaginal medications that harm condoms are:  · Cleocin cream.  · Butoconazole (Femstat®).  · Terconazole (Terazol®) vaginal suppository.  · Miconazole (Monistat®) (may be purchased over the counter).  SEEK MEDICAL CARE IF:   · You have a temperature by mouth above 102° F (38.9° C).  · The infection is getting worse after 2 days of treatment.  · The infection is not getting better after 3 days of treatment.  · You develop blisters in or around your vagina.  · You develop vaginal bleeding, and it is not your menstrual period.  · You have pain when you urinate.  · You develop intestinal problems.  · You have pain with sexual intercourse.  Document Released: 10/08/2004 Document Revised: 03/23/2011 Document Reviewed: 06/22/2008  ExitCare® Patient Information ©2014 [employer].

## 2013-04-19 LAB — RPR

## 2013-04-19 LAB — GC/CHLAMYDIA PROBE AMP
CT Probe RNA: NEGATIVE
GC Probe RNA: NEGATIVE

## 2013-04-19 LAB — HEPATITIS B SURFACE ANTIGEN: Hepatitis B Surface Ag: NEGATIVE

## 2013-04-19 LAB — HIV ANTIBODY (ROUTINE TESTING W REFLEX): HIV: NONREACTIVE

## 2013-04-19 LAB — HEPATITIS C ANTIBODY: HCV Ab: NEGATIVE

## 2013-07-06 ENCOUNTER — Encounter: Payer: Self-pay | Admitting: Gynecology

## 2013-07-06 ENCOUNTER — Ambulatory Visit (INDEPENDENT_AMBULATORY_CARE_PROVIDER_SITE_OTHER): Payer: BC Managed Care – PPO | Admitting: Gynecology

## 2013-07-06 DIAGNOSIS — N898 Other specified noninflammatory disorders of vagina: Secondary | ICD-10-CM

## 2013-07-06 LAB — WET PREP FOR TRICH, YEAST, CLUE: Trich, Wet Prep: NONE SEEN

## 2013-07-06 MED ORDER — CLINDAMYCIN PHOSPHATE 2 % VA CREA: 1.0000 | TOPICAL_CREAM | Freq: Every day | VAGINAL | Status: DC

## 2013-07-06 MED ORDER — FLUCONAZOLE 150 MG PO TABS: 150.0000 mg | ORAL_TABLET | Freq: Once | ORAL | Status: DC

## 2013-07-06 NOTE — Progress Notes (Signed)
Brooke CoriaBrittney Howell August 10, 1990 [id number]        23 y.o.  G1P0010 presents with several day history of vaginal discharge with odor. Notes some low back pain also. No urinary symptoms such as frequency dysuria or urgency. No fever chills nausea vomiting diarrhea constipation. Has been treated for both BV and yeast in the past. Recent screening for STDs 04/2013 negative. Declines need to screen now.  Past medical history,surgical history, problem list, medications, allergies, family history and social history were all reviewed and documented in the EPIC chart.  Directed ROS with pertinent positives and negatives documented in the history of present illness/assessment and plan.  Exam: [name] assistant General appearance  Normal Spine straight without CVA tenderness or deformity Abdomen soft nontender without masses guarding or rebound Pelvic external BUS vagina with thick white sticky discharge. Cervix normal. Uterus normal size midline mobile nontender. Adnexa without masses or tenderness.  Assessment/Plan:  23 y.o. G1P0010 with history, exam and wet prep consistent with both yeast and bacterial vaginosis. Treat with Diflucan 150 mg x2 days and Cleocin vaginal cream nightly x7 days at her request. Declined STD screening. Followup if symptoms persist, worsen or recur.   Note: This document was prepared with digital dictation and possible smart phrase technology. Any transcriptional errors that result from this process are unintentional.   Dara LordsFONTAINE,TIMOTHY P MD, 11:32 AM 07/06/2013

## 2013-07-06 NOTE — Patient Instructions (Signed)
Take Diflucan pill daily for 2 days Use the Cleocin vaginal cream nightly for 7 days. Followup if symptoms persist, worsen or recur.  Bacterial Vaginosis Bacterial vaginosis is a vaginal infection that occurs when the normal balance of bacteria in the vagina is disrupted. It results from an overgrowth of certain bacteria. This is the most common vaginal infection in women of childbearing age. Treatment is important to prevent complications, especially in pregnant women, as it can cause a premature delivery. CAUSES  Bacterial vaginosis is caused by an increase in harmful bacteria that are normally present in smaller amounts in the vagina. Several different kinds of bacteria can cause bacterial vaginosis. However, the reason that the condition develops is not fully understood. RISK FACTORS Certain activities or behaviors can put you at an increased risk of developing bacterial vaginosis, including:  Having a new sex partner or multiple sex partners.  Douching.  Using an intrauterine device (IUD) for contraception. Women do not get bacterial vaginosis from toilet seats, bedding, swimming pools, or contact with objects around them. SIGNS AND SYMPTOMS  Some women with bacterial vaginosis have no signs or symptoms. Common symptoms include:  Grey vaginal discharge.  A fishlike odor with discharge, especially after sexual intercourse.  Itching or burning of the vagina and vulva.  Burning or pain with urination. DIAGNOSIS  Your health care Florenda Watt will take a medical history and examine the vagina for signs of bacterial vaginosis. A sample of vaginal fluid may be taken. Your health care Herbert Marken will look at this sample under a microscope to check for bacteria and abnormal cells. A vaginal pH test may also be done.  TREATMENT  Bacterial vaginosis may be treated with antibiotic medicines. These may be given in the form of a pill or a vaginal cream. A second round of antibiotics may be prescribed  if the condition comes back after treatment.  HOME CARE INSTRUCTIONS   Only take over-the-counter or prescription medicines as directed by your health care Deshane Cotroneo.  If antibiotic medicine was prescribed, take it as directed. Make sure you finish it even if you start to feel better.  Do not have sex until treatment is completed.  Tell all sexual partners that you have a vaginal infection. They should see their health care Cathlyn Tersigni and be treated if they have problems, such as a mild rash or itching.  Practice safe sex by using condoms and only having one sex partner. SEEK MEDICAL CARE IF:   Your symptoms are not improving after 3 days of treatment.  You have increased discharge or pain.  You have a fever. MAKE SURE YOU:   Understand these instructions.  Will watch your condition.  Will get help right away if you are not doing well or get worse. FOR MORE INFORMATION  Centers for Disease Control and Prevention, Division of STD Prevention: SolutionApps.co.zawww.cdc.gov/std American Sexual Health Association (ASHA): www.ashastd.org  Document Released: 12/29/2004 Document Revised: 10/19/2012 Document Reviewed: 08/10/2012 [facility] Patient Information 2015 [place] [place]. This information is not intended to replace advice given to you by your health care Tanasia Budzinski. Make sure you discuss any questions you have with your health care Yolinda Duerr.

## 2013-07-21 ENCOUNTER — Telehealth: Payer: Self-pay

## 2013-07-21 MED ORDER — FLUCONAZOLE 150 MG PO TABS: 150.0000 mg | ORAL_TABLET | Freq: Once | ORAL | Status: DC

## 2013-07-21 NOTE — Telephone Encounter (Signed)
Rx sent. Patient informed. 

## 2013-07-21 NOTE — Telephone Encounter (Signed)
Diflucan 150 mg x1 okay 

## 2013-07-21 NOTE — Telephone Encounter (Signed)
Patient called requesting a Diflucan tab. She said she was a couple wks ago and you treated her for bacterial inf and also prescribed a Diflucan tab for yeast. She took the Diflucan tab first prior to treating bacterial inf.  She said she has symptoms of yeast inf now and would like Diflucan tab.

## 2013-08-02 ENCOUNTER — Encounter: Payer: Self-pay | Admitting: Gynecology

## 2013-08-02 ENCOUNTER — Ambulatory Visit (INDEPENDENT_AMBULATORY_CARE_PROVIDER_SITE_OTHER): Payer: BC Managed Care – PPO | Admitting: Gynecology

## 2013-08-02 DIAGNOSIS — R102 Pelvic and perineal pain: Secondary | ICD-10-CM

## 2013-08-02 DIAGNOSIS — N949 Unspecified condition associated with female genital organs and menstrual cycle: Secondary | ICD-10-CM

## 2013-08-02 DIAGNOSIS — L293 Anogenital pruritus, unspecified: Secondary | ICD-10-CM

## 2013-08-02 DIAGNOSIS — N912 Amenorrhea, unspecified: Secondary | ICD-10-CM

## 2013-08-02 DIAGNOSIS — N898 Other specified noninflammatory disorders of vagina: Secondary | ICD-10-CM

## 2013-08-02 LAB — WET PREP FOR TRICH, YEAST, CLUE: TRICH WET PREP: NONE SEEN

## 2013-08-02 MED ORDER — FLUCONAZOLE 150 MG PO TABS: 150.0000 mg | ORAL_TABLET | Freq: Once | ORAL | Status: DC

## 2013-08-02 NOTE — Patient Instructions (Signed)
Take the Diflucan pill today and repeated in 2 days. Followup if the vaginal discharge and itching continues. Followup for your lab results

## 2013-08-02 NOTE — Progress Notes (Signed)
Hershal CoriaBrittney Stroebel [birth year] [id number]        23 y.o.  G1P0010 presents complaining of postcoital spotting since last week and lower abdominal cramping. Vaginal discharge and itching. No urinary symptoms such as frequency urgency dysuria or low back pain. Using nexplanon without menses.  Past medical history,surgical history, problem list, medications, allergies, family history and social history were all reviewed and documented in the EPIC chart.  Directed ROS with pertinent positives and negatives documented in the history of present illness/assessment and plan.  Exam: Sherrilyn RistKari assistant General appearance:  Normal Spine straight without CVA tenderness Abdomen soft nontender without masses guarding rebound organomegaly Pelvic external BUS vagina with thick cakey white discharge. Cervix normal. Uterus normal size midline mobile nontender. Adnexa without masses or tenderness.  Assessment/Plan:  23 y.o. G1P0010 with symptoms, exam and wet prep consistent with yeast vulvovaginitis. Will treat with Diflucan 150 mg every other day x2 doses. Followup if symptoms persist, worsen or recur. Given her cramping history GC and chlamydia screen done. Also will check qualitative hCG to make sure not related to early pregnancy event. Assuming all negative then we'll follow and if symptoms resolve we'll follow routinely. If symptoms continue or change then she'll represent for evaluation.   Note: This document was prepared with digital dictation and possible smart phrase technology. Any transcriptional errors that result from this process are unintentional.   Dara LordsFONTAINE,Laney Louderback P MD, 3:20 PM 08/02/2013

## 2013-08-03 LAB — HCG, SERUM, QUALITATIVE: Preg, Serum: NEGATIVE

## 2013-08-03 LAB — GC/CHLAMYDIA PROBE AMP
CT PROBE, AMP APTIMA: NEGATIVE
GC Probe RNA: NEGATIVE

## 2013-08-21 ENCOUNTER — Telehealth: Payer: Self-pay | Admitting: *Deleted

## 2013-08-21 MED ORDER — TERCONAZOLE 0.4 % VA CREA: 1.0000 | TOPICAL_CREAM | Freq: Every day | VAGINAL | Status: DC

## 2013-08-21 NOTE — Telephone Encounter (Signed)
Dr.[name] patient) pt was seen on 08/02/13 given diflucan for yeast infection told to call back if #2 tablet didn't cure. Pt said still having vaginal itching reqesting new Rx for recurrent yeast. Please advise

## 2013-08-21 NOTE — Telephone Encounter (Signed)
Rx sent pharmacy, pt informed with all the below.

## 2013-08-21 NOTE — Telephone Encounter (Signed)
Please call in prescription for Terazol-7 to aply one applicatorful vaginally qhs for 1 week. No intercourse during this week. If persist she will need to follow up with Dr. Audie BoxFontaine

## 2013-10-02 ENCOUNTER — Ambulatory Visit (INDEPENDENT_AMBULATORY_CARE_PROVIDER_SITE_OTHER): Payer: BC Managed Care – PPO | Admitting: Gynecology

## 2013-10-02 ENCOUNTER — Encounter: Payer: Self-pay | Admitting: Gynecology

## 2013-10-02 DIAGNOSIS — N898 Other specified noninflammatory disorders of vagina: Secondary | ICD-10-CM

## 2013-10-02 DIAGNOSIS — Z3046 Encounter for surveillance of implantable subdermal contraceptive: Secondary | ICD-10-CM

## 2013-10-02 HISTORY — PX: OTHER SURGICAL HISTORY: SHX169

## 2013-10-02 LAB — WET PREP FOR TRICH, YEAST, CLUE
TRICH WET PREP: NONE SEEN
WBC WET PREP: NONE SEEN
YEAST WET PREP: NONE SEEN

## 2013-10-02 MED ORDER — FLUCONAZOLE 150 MG PO TABS: 150.0000 mg | ORAL_TABLET | Freq: Once | ORAL | Status: DC

## 2013-10-02 MED ORDER — METRONIDAZOLE 0.75 % VA GEL: 1.0000 | Freq: Every day | VAGINAL | Status: DC

## 2013-10-02 MED ORDER — NORETHINDRONE ACET-ETHINYL EST 1-20 MG-MCG PO TABS: 1.0000 | ORAL_TABLET | Freq: Every day | ORAL | Status: DC

## 2013-10-02 NOTE — Addendum Note (Signed)
Addended by: Dayna Barker on: 10/02/2013 12:58 PM   Modules accepted: Orders

## 2013-10-02 NOTE — Progress Notes (Signed)
Brooke Howell Nov 18, 1990 [id number]        23 y.o.  G1P0010 Presents to have her Nexplanon removed. She is having a lot of issues as far as weight control and is convinced that this is contributing to her weight gain. Had used Depo-Provera in the past also for contraception. Now wants to consider oral contraceptives as she has never been on these before.  Also notes vaginal discharge with wiping. No itching or odor over the last one to 2 weeks.  Past medical history,surgical history, problem list, medications, allergies, family history and social history were all reviewed and documented in the EPIC chart.  Directed ROS with pertinent positives and negatives documented in the history of present illness/assessment and plan.  Exam: [name] assistant General appearance:  Normal External BUS vagina with white discharge. Cervix normal. Uterus normal size midline mobile nontender. Adnexa without masses or tenderness.  Procedure: The skin in the left upper, inner forearm, overlying the distal end of the Nexplanon rod was cleansed with Betadine and infiltrated with 1% Xylocaine. A small skin incision was made and through blunt dissection using small forceps the distal end of the rod was worked out through the incision grasped with the small forceps and the Nexplanon rod was removed in its entirety, shown to the patient and discarded. A Steri-Strip was applied over the incision site with scant bleeding noted. A pressure dressing was applied and postoperative instructions given to the patient.  Assessment/Plan:  23 y.o. G1P0010 with: 1. Nexplanon removal. Successful without complications. 2. Contraceptive options reviewed with the patient and ultimately she elects for oral contraceptives. How to take the pills was reviewed and I prescribed a Loestrin 120 equivalent. She will go and start those today and backup contraception for the first pack was stressed.  Follow up if any issues once starting. 3. Vaginal  discharge. Wet prep consistent with bacterial vaginosis. Will treat with MetroGel nightly for 5-7 days.  Prophylactic Diflucan 150 mg given as patient probably will develop yeast after this.     [name] MD, 10:59 AM 10/02/2013

## 2013-10-02 NOTE — Patient Instructions (Signed)
Call if the incision site becomes red, tender or drains. Start on the birth control pills tonight. Use backup contraception during the first pack. Call if any issues or questions about the birth control pills. Use the MetroGel intravaginal cream nightly for 5-7 days. Take the Diflucan as needed.

## 2013-10-03 ENCOUNTER — Telehealth: Payer: Self-pay | Admitting: *Deleted

## 2013-10-03 NOTE — Telephone Encounter (Signed)
Pt had Nexplanon removal on 10/02/13 states pressure dressing fell off, pt said incision site was open bleeding a good amount of blood. Pt placed a bandage over site now states bleeding has slowed down now, pt asked what you recommend for to keep over this area? Just use guaze and steri strips? Please advise

## 2013-10-03 NOTE — Telephone Encounter (Signed)
Unable to leave message, pt mailbox is full.

## 2013-10-03 NOTE — Telephone Encounter (Signed)
Keep pressure dressing on it tonight. Assuming bleeding resolves and follow. There is any question then office visit. Usually a folded gauze with a Band-Aid over it will be enough pressure

## 2013-10-04 NOTE — Telephone Encounter (Signed)
Pt informed with the below note. 

## 2013-10-10 ENCOUNTER — Telehealth: Payer: Self-pay

## 2013-10-10 NOTE — Telephone Encounter (Signed)
error 

## 2013-10-10 NOTE — Telephone Encounter (Signed)
Patient called in voice mail stating she "had a few questions about something".  I returned her call but no answer and her voice mail was not set up so I was unable to leave her a message.

## 2013-10-27 ENCOUNTER — Ambulatory Visit (INDEPENDENT_AMBULATORY_CARE_PROVIDER_SITE_OTHER): Payer: BC Managed Care – PPO | Admitting: Women's Health

## 2013-10-27 ENCOUNTER — Encounter: Payer: Self-pay | Admitting: Women's Health

## 2013-10-27 VITALS — BP 122/78 | Ht 67.0 in | Wt 225.0 lb

## 2013-10-27 DIAGNOSIS — N760 Acute vaginitis: Secondary | ICD-10-CM

## 2013-10-27 DIAGNOSIS — A499 Bacterial infection, unspecified: Secondary | ICD-10-CM

## 2013-10-27 DIAGNOSIS — B9689 Other specified bacterial agents as the cause of diseases classified elsewhere: Secondary | ICD-10-CM

## 2013-10-27 DIAGNOSIS — N898 Other specified noninflammatory disorders of vagina: Secondary | ICD-10-CM

## 2013-10-27 LAB — WET PREP FOR TRICH, YEAST, CLUE
TRICH WET PREP: NONE SEEN
WBC, Wet Prep HPF POC: NONE SEEN
YEAST WET PREP: NONE SEEN

## 2013-10-27 MED ORDER — FLUCONAZOLE 150 MG PO TABS: 150.0000 mg | ORAL_TABLET | Freq: Once | ORAL | Status: DC

## 2013-10-27 MED ORDER — METRONIDAZOLE 0.75 % VA GEL: 1.0000 | Freq: Every day | VAGINAL | Status: DC

## 2013-10-27 NOTE — Progress Notes (Signed)
Patient ID: Brooke CoriaBrittney Howell, female   DOB: 12-02-1990, 23 y.o.   MRN: [id number] Presents with complaint of vaginal discharge with odor for several days and mild itching. Has had  problems with recurrent BV . Nexplanon removed 09/2013. Currently on Loestrin 1/20, having slight nausea. Denies urinary symptoms, abdominal pain or fever. Same partner.  Exam: Appears well. External genitalia within normal limits, speculum exam moderate amount of the whitish year discharge noted wet prep positive for amines, clues, and TNTC bacteria. Bimanual no CMT or adnexal fullness or tenderness.  Recurrent bacteria vaginosis  Plan: MetroGel vaginal cream 1 applicator at bedtime x5 and then 1 applicator monthly after cycles. Alcohol precautions reviewed. Instructed to call if no relief of symptoms. Diflucan 150 by mouth x1 dose if vaginal itching after completing MetroGel.

## 2013-11-13 ENCOUNTER — Encounter: Payer: Self-pay | Admitting: Women's Health

## 2013-11-24 ENCOUNTER — Encounter: Payer: Self-pay | Admitting: Gynecology

## 2013-11-24 ENCOUNTER — Ambulatory Visit (INDEPENDENT_AMBULATORY_CARE_PROVIDER_SITE_OTHER): Payer: BC Managed Care – PPO | Admitting: Gynecology

## 2013-11-24 DIAGNOSIS — Z113 Encounter for screening for infections with a predominantly sexual mode of transmission: Secondary | ICD-10-CM

## 2013-11-24 DIAGNOSIS — N912 Amenorrhea, unspecified: Secondary | ICD-10-CM

## 2013-11-24 DIAGNOSIS — N898 Other specified noninflammatory disorders of vagina: Secondary | ICD-10-CM

## 2013-11-24 LAB — WET PREP FOR TRICH, YEAST, CLUE: Trich, Wet Prep: NONE SEEN

## 2013-11-24 MED ORDER — FLUCONAZOLE 150 MG PO TABS: 150.0000 mg | ORAL_TABLET | Freq: Once | ORAL | Status: DC

## 2013-11-24 MED ORDER — CLINDAMYCIN PHOSPHATE 2 % VA CREA: 1.0000 | TOPICAL_CREAM | Freq: Every day | VAGINAL | Status: DC

## 2013-11-24 NOTE — Progress Notes (Signed)
Brooke Howell October 19, 1990 [id number]        23 y.o.  G1P0010 presents with vaginal discharge and odor.  Was recently treated in October by Harriett SineNancy for bacterial vaginosis. Set her symptoms clear but now seem to have recurred. Has started the oral contraceptives and has not had a period yet. It's been several years since her last period being on Nexplanon. Did check a home pregnancy test which was negative.  Is having nausea after taking the pill.  Past medical history,surgical history, problem list, medications, allergies, family history and social history were all reviewed and documented in the EPIC chart.  Directed ROS with pertinent positives and negatives documented in the history of present illness/assessment and plan.  Exam: [name] assistant General appearance:  Normal Abdomen soft nontender without masses guarding rebound Pelvic external BUS vagina with abundant white discharge. Cervix normal. Uterus normal size midline mobile nontender. Adnexa without masses or tenderness.  Assessment/Plan:  23 y.o. G1P0010 vaginal discharge with wet prep consistent with yeast and bacterial vaginosis. Will treat with Diflucan 150 mg 1 dose and Cleocin vaginal cream nightly 7 days at her choice. Will follow up if her symptoms persist worsen or recur. GC/chlamydia screen done.  Recommended she take her birth control pill at bedtime to see if this doesn't resolve her nausea issue.  If it continues I reviewed options to switch to a different pill.  I did recommend serum qualitative hCG for completeness. Proper use of the birth control pills reviewed. Will follow up if she continues without menses.     Dara LordsFONTAINE,Cortne Amara P MD, 10:03 AM 11/24/2013

## 2013-11-24 NOTE — Addendum Note (Signed)
Addended by: Dayna BarkerGARDNER, Piedad Standiford K on: 11/24/2013 11:35 AM   Modules accepted: Orders

## 2013-11-24 NOTE — Patient Instructions (Signed)
Use the Cleocin vaginal cream nightly for 7 days. Take one Diflucan pill. Call if the vaginal irritation/discharge continues. Try taking the birth control pill at a time. Call me if you continue to have nausea with this.

## 2013-11-25 LAB — HCG, SERUM, QUALITATIVE: Preg, Serum: NEGATIVE

## 2013-11-25 LAB — GC/CHLAMYDIA PROBE AMP
CT PROBE, AMP APTIMA: NEGATIVE
GC PROBE AMP APTIMA: NEGATIVE

## 2013-12-01 ENCOUNTER — Telehealth: Payer: Self-pay | Admitting: *Deleted

## 2013-12-01 MED ORDER — FLUCONAZOLE 150 MG PO TABS: 150.0000 mg | ORAL_TABLET | Freq: Once | ORAL | Status: DC

## 2013-12-01 NOTE — Telephone Encounter (Signed)
Diflucan 150mg 1 dose

## 2013-12-01 NOTE — Telephone Encounter (Signed)
Pt called requesting refill on diflucan c/o yeast infection from Rx cleocin from OV 11/24/13. She took the 1 pill given at OV, but no relief.  Please advise

## 2013-12-01 NOTE — Telephone Encounter (Signed)
Rx sent, unable to inform pt due to no voicemail.

## 2013-12-22 ENCOUNTER — Ambulatory Visit (INDEPENDENT_AMBULATORY_CARE_PROVIDER_SITE_OTHER): Payer: BC Managed Care – PPO | Admitting: Gynecology

## 2013-12-22 ENCOUNTER — Encounter: Payer: Self-pay | Admitting: Gynecology

## 2013-12-22 DIAGNOSIS — N760 Acute vaginitis: Secondary | ICD-10-CM

## 2013-12-22 DIAGNOSIS — N898 Other specified noninflammatory disorders of vagina: Secondary | ICD-10-CM

## 2013-12-22 DIAGNOSIS — B9689 Other specified bacterial agents as the cause of diseases classified elsewhere: Secondary | ICD-10-CM

## 2013-12-22 DIAGNOSIS — N912 Amenorrhea, unspecified: Secondary | ICD-10-CM

## 2013-12-22 DIAGNOSIS — A499 Bacterial infection, unspecified: Secondary | ICD-10-CM

## 2013-12-22 LAB — WET PREP FOR TRICH, YEAST, CLUE
Trich, Wet Prep: NONE SEEN
Yeast Wet Prep HPF POC: NONE SEEN

## 2013-12-22 MED ORDER — METRONIDAZOLE 500 MG PO TABS: 500.0000 mg | ORAL_TABLET | Freq: Two times a day (BID) | ORAL | Status: DC

## 2013-12-22 MED ORDER — FLUCONAZOLE 150 MG PO TABS: 150.0000 mg | ORAL_TABLET | Freq: Once | ORAL | Status: DC

## 2013-12-22 NOTE — Progress Notes (Signed)
Brooke CoriaBrittney Howell 08/17/1990 [id number]        23 y.o.  G1P0010 presents complaining of:  1. Heavier vaginal discharge. No itching odor or any urinary symptoms such as frequency dysuria urgency.  Recently has been treated for bacterial vaginosis in October and November. Symptoms have resolved but now she notes a heavier discharge. 2. Without menses after 2 packs of oral contraceptives going into her third pack. Has been amenorrheic since her Nexplanon 2007. Had baseline labs checked last year to include FSH TSH prolactin which were all normal. Had her Nexplanon removed 09/2013 and started on the oral contraceptives.  Past medical history,surgical history, problem list, medications, allergies, family history and social history were all reviewed and documented in the EPIC chart.  Directed ROS with pertinent positives and negatives documented in the history of present illness/assessment and plan.  Exam: [name] assistant General appearance:  Normal Abdomen soft nontender without masses guarding rebound Pelvic external BUS vagina with white discharge. Cervix normal. Uterus normal size midline mobile nontender. Adnexa without masses or tenderness.  Assessment/Plan:  23 y.o. G1P0010 with:  1. Exam and wet prep consistent with bacterial vaginosis. Will treat with Flagyl 500 mg twice a day 7 days. I'll call avoidance reviewed. Patient relates getting yeast infections when being treated for bacterial vaginosis and I gave her a prescription for Diflucan 150 mg 1 dose to take several days into the treatment regimen. Follow up if symptoms persist, worsen or recur. 2. Amenorrhea now into her third pack of oral contraceptives. Patient will call me if she again does not have bleeding at the end of this pack and I will switch her to a higher estrogen dose pill such as Ortho-Novum 1/35 and will see how she does with that. If she does have menses regardless of how light and we will follow and continue the 1/20  equivalent.     Dara LordsFONTAINE,Brooke Charrette P MD, 11:26 AM 12/22/2013

## 2013-12-22 NOTE — Patient Instructions (Signed)
Take the Flagyl medication twice daily for 7 days. Avoid alcohol while taking. Call if your vaginal symptoms persist, worsen or recur.  Call if you do not have a period after her next birth control pill pack and we will increase the estrogen dose in the pills.

## 2014-01-17 ENCOUNTER — Telehealth: Payer: Self-pay | Admitting: *Deleted

## 2014-01-17 MED ORDER — METRONIDAZOLE 500 MG PO TABS: 500.0000 mg | ORAL_TABLET | Freq: Two times a day (BID) | ORAL | Status: DC

## 2014-01-17 MED ORDER — FLUCONAZOLE 150 MG PO TABS: 150.0000 mg | ORAL_TABLET | Freq: Once | ORAL | Status: DC

## 2014-01-17 NOTE — Telephone Encounter (Signed)
Pt was treated for BV infection on 12/22/13 Flagyl 500 mg twice a day 7 days and Diflucan 150 mg 1 dose. Pt said some symptoms have returned, asked if you would willing to prescribe some medication again? Please advise

## 2014-01-17 NOTE — Telephone Encounter (Signed)
Pt informed, rx sent 

## 2014-01-17 NOTE — Telephone Encounter (Signed)
Okay to refill both.

## 2014-01-29 ENCOUNTER — Telehealth: Payer: Self-pay | Admitting: *Deleted

## 2014-01-29 MED ORDER — FLUCONAZOLE 150 MG PO TABS: 150.0000 mg | ORAL_TABLET | Freq: Once | ORAL | Status: DC

## 2014-01-29 NOTE — Telephone Encounter (Signed)
Diflucan 150 mg 1. Office visit if symptoms continue

## 2014-01-29 NOTE — Telephone Encounter (Signed)
Rx sent, unable to leave message to inform patient because her voicemail is full.

## 2014-01-29 NOTE — Telephone Encounter (Signed)
Pt received refill diflucan 150 #1 on telephone encounter 01/17/14. Pt asked if 1 more pill could be sent? Still has slight yeast symptoms.

## 2014-02-08 ENCOUNTER — Encounter: Payer: BC Managed Care – PPO | Admitting: Gynecology

## 2014-02-22 ENCOUNTER — Ambulatory Visit (INDEPENDENT_AMBULATORY_CARE_PROVIDER_SITE_OTHER): Payer: BC Managed Care – PPO | Admitting: Gynecology

## 2014-02-22 ENCOUNTER — Encounter: Payer: Self-pay | Admitting: Gynecology

## 2014-02-22 DIAGNOSIS — B9689 Other specified bacterial agents as the cause of diseases classified elsewhere: Secondary | ICD-10-CM

## 2014-02-22 DIAGNOSIS — N760 Acute vaginitis: Secondary | ICD-10-CM

## 2014-02-22 DIAGNOSIS — A499 Bacterial infection, unspecified: Secondary | ICD-10-CM

## 2014-02-22 LAB — WET PREP FOR TRICH, YEAST, CLUE
Trich, Wet Prep: NONE SEEN
Yeast Wet Prep HPF POC: NONE SEEN

## 2014-02-22 MED ORDER — CLINDAMYCIN PHOSPHATE 2 % VA CREA: 1.0000 | TOPICAL_CREAM | VAGINAL | Status: DC

## 2014-02-22 MED ORDER — METRONIDAZOLE 500 MG PO TABS
500.0000 mg | ORAL_TABLET | Freq: Two times a day (BID) | ORAL | Status: DC
Start: 2014-02-22 — End: 2014-04-03

## 2014-02-22 MED ORDER — FLUCONAZOLE 150 MG PO TABS: 150.0000 mg | ORAL_TABLET | Freq: Once | ORAL | Status: DC

## 2014-02-22 NOTE — Patient Instructions (Signed)
Take the Flagyl medication twice daily for 7 days. Avoid alcohol while taking. Use the Cleocin vaginal cream 1 applicator one night each week to help prevent a recurrence of the bacterial vaginosis. Use the Diflucan pills if you feel like you're getting a yeast infection from the antibiotics.   Follow up if symptoms persist, worsen or recur.

## 2014-02-22 NOTE — Progress Notes (Signed)
Brooke CoriaBrittney Howell 1990/10/25 [id number]        24 y.o.  G1P0010 presents with several day history of increasing discharge with irritation and odor. Prior history of bacterial vaginosis treated in October and December.  No urinary symptoms such as frequency dysuria urgency. Had been on oral contraceptives without menses but stopped the birth control pills and has had 2 regular menses in January and February. Prefers to stay off of birth control using barrier contraception.  Past medical history,surgical history, problem list, medications, allergies, family history and social history were all reviewed and documented in the EPIC chart.  Directed ROS with pertinent positives and negatives documented in the history of present illness/assessment and plan.  Exam: [name] assistant General appearance:  Normal Abdomen soft nontender without masses guarding rebound Pelvic external BUS vagina with abundant white discharge. Cervix normal. Uterus normal size midline mobile nontender. Adnexa without masses or tenderness.  Assessment/Plan:  24 y.o. G1P0010 with history exam and wet prep consistent with bacterial vaginosis. Will treat with Flagyl 500 mg twice a day 7 days, alcohol avoidance reviewed. Separate prescription for Cleocin vaginal cream to use 1 applicator weekly prophylactically to prevent a recurrence until no more cream. Patient does relate frequent yeast infections when treated with antibiotics. Diflucan 150 mg #2 pills provided to have as needed. Follow up if symptoms persist, worsen or recur. Is having now spontaneous menses. Reviewed birth control options and she declines. pPrefers to continue with barrier methods accepting increased failure risk.  STD screening was offered and declined.     Dara LordsFONTAINE,Haward Pope P MD, 2:39 PM 02/22/2014

## 2014-03-12 ENCOUNTER — Telehealth: Payer: Self-pay | Admitting: *Deleted

## 2014-03-12 NOTE — Telephone Encounter (Signed)
It is not good to be taking too much Diflucan. From a suppressing recurrence standpoint I would actually recommend boric acid suppositories 600 mg twice weekly intravaginal. I would stay on this weekly to see if this doesn't prevent recurrences. #30. Have her call me near the end of the supply to see how she's doing.

## 2014-03-12 NOTE — Telephone Encounter (Signed)
Pt called requesting Rx for Diflucan due to repeat yeast infection, currently taking cleocin vaginal cream weekly. Took the diflucan prescribed on OV 02/22/14. Pt if you could give her a Rx for Diflucan #30 tablets to take PRN due to the recurrence?

## 2014-03-13 MED ORDER — NONFORMULARY OR COMPOUNDED ITEM: Status: DC

## 2014-03-13 NOTE — Telephone Encounter (Signed)
Pt said she would like Rx this was called in.

## 2014-03-13 NOTE — Telephone Encounter (Signed)
Pt informed with the below note, pt said she will call back is going research it more.

## 2014-04-03 ENCOUNTER — Encounter: Payer: Self-pay | Admitting: Gynecology

## 2014-04-03 ENCOUNTER — Ambulatory Visit (INDEPENDENT_AMBULATORY_CARE_PROVIDER_SITE_OTHER): Payer: BC Managed Care – PPO | Admitting: Gynecology

## 2014-04-03 VITALS — BP 126/80 | Ht 67.0 in | Wt 222.0 lb

## 2014-04-03 DIAGNOSIS — Z01419 Encounter for gynecological examination (general) (routine) without abnormal findings: Secondary | ICD-10-CM | POA: Diagnosis not present

## 2014-04-03 LAB — CBC WITH DIFFERENTIAL/PLATELET
BASOS PCT: 0 % (ref 0–1)
Basophils Absolute: 0 10*3/uL (ref 0.0–0.1)
EOS ABS: 0.1 10*3/uL (ref 0.0–0.7)
EOS PCT: 2 % (ref 0–5)
HEMATOCRIT: 41.6 % (ref 36.0–46.0)
HEMOGLOBIN: 13.6 g/dL (ref 12.0–15.0)
Lymphocytes Relative: 42 % (ref 12–46)
Lymphs Abs: 2.4 10*3/uL (ref 0.7–4.0)
MCH: 29.6 pg (ref 26.0–34.0)
MCHC: 32.7 g/dL (ref 30.0–36.0)
MCV: 90.6 fL (ref 78.0–100.0)
MONOS PCT: 7 % (ref 3–12)
MPV: 9.8 fL (ref 8.6–12.4)
Monocytes Absolute: 0.4 10*3/uL (ref 0.1–1.0)
NEUTROS PCT: 49 % (ref 43–77)
Neutro Abs: 2.8 10*3/uL (ref 1.7–7.7)
Platelets: 266 10*3/uL (ref 150–400)
RBC: 4.59 MIL/uL (ref 3.87–5.11)
RDW: 13.6 % (ref 11.5–15.5)
WBC: 5.7 10*3/uL (ref 4.0–10.5)

## 2014-04-03 LAB — COMPREHENSIVE METABOLIC PANEL
ALT: 14 U/L (ref 0–35)
AST: 16 U/L (ref 0–37)
Albumin: 4.3 g/dL (ref 3.5–5.2)
Alkaline Phosphatase: 62 U/L (ref 39–117)
BUN: 15 mg/dL (ref 6–23)
CALCIUM: 9.3 mg/dL (ref 8.4–10.5)
CHLORIDE: 102 meq/L (ref 96–112)
CO2: 27 mEq/L (ref 19–32)
Creat: 1.02 mg/dL (ref 0.50–1.10)
GLUCOSE: 93 mg/dL (ref 70–99)
Potassium: 3.9 mEq/L (ref 3.5–5.3)
Sodium: 135 mEq/L (ref 135–145)
Total Bilirubin: 0.5 mg/dL (ref 0.2–1.2)
Total Protein: 7.1 g/dL (ref 6.0–8.3)

## 2014-04-03 LAB — LIPID PANEL
Cholesterol: 192 mg/dL (ref 0–200)
HDL: 58 mg/dL (ref >=46)
LDL CALC: 115 mg/dL — AB (ref 0–99)
TRIGLYCERIDES: 97 mg/dL (ref <150)
Total CHOL/HDL Ratio: 3.3 Ratio
VLDL: 19 mg/dL (ref 0–40)

## 2014-04-03 NOTE — Progress Notes (Signed)
Hershal CoriaBrittney Magaw 07/14/1990 [id number]        24 y.o.  G1P0010 for annual exam.  Doing well.  Past medical history,surgical history, problem list, medications, allergies, family history and social history were all reviewed and documented as reviewed in the EPIC chart.  ROS:  Performed with pertinent positives and negatives included in the history, assessment and plan.   Additional significant findings :  none   Exam: Delena ServeKim Alexis assistant Filed Vitals:   04/03/14 1533  BP: 126/80  Height: 5\' 7"  (1.702 m)  Weight: 222 lb (100.699 kg)   General appearance:  Normal affect, orientation and appearance. Skin: Grossly normal HEENT: Without gross lesions.  No cervical or supraclavicular adenopathy. Thyroid normal.  Lungs:  Clear without wheezing, rales or rhonchi Cardiac: RR, without RMG Abdominal:  Soft, nontender, without masses, guarding, rebound, organomegaly or hernia Breasts:  Examined lying and sitting without masses, retractions, discharge or axillary adenopathy. Pelvic:  Ext/BUS/vagina normal  Cervix normal  Uterus anteverted, normal size, shape and contour, midline and mobile nontender   Adnexa  Without masses or tenderness    Anus and perineum  Normal    Assessment/Plan:  24 y.o. 411P0010 female for annual exam with regular monthly menses, abstinent birth control.   1. Contraception.  Patient currently not sexually active. Declines contraception at this point. Will call when she wants to consider contraception. She knows the need to address this before becoming sexually active. 2. STD screening offered and declined. 3. Pap smear/HPV negative 12/2012.  No Pap smear done today. 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 Follow up Pap smear 2013 and Pap smear/HPV 2014 negative. Plan repeat Pap smear at 3 year interval. 4. History of recurrent vaginitis over the past year. Both bacterial and yeast. Used  boric acid suppositories times several days and this seemed to resolve her issues. Has not had any recurrences recently. Has supply of boric acid at home will use at earliest sign of vaginal irritation/discharge. 5. Breast health. SBE monthly reviewed. 6. Health maintenance. Baseline CBC comprehensive metabolic panel lipid profile urinalysis ordered. Follow up in one year, sooner as needed.     Dara LordsFONTAINE,Deiontae Rabel P MD, 3:57 PM 04/03/2014

## 2014-04-03 NOTE — Patient Instructions (Signed)
You may obtain a copy of any labs that were done today by logging onto MyChart as outlined in the instructions provided with your AVS (after visit summary). The office will not call with normal lab results but certainly if there are any significant abnormalities then we will contact you.   Health Maintenance, Female A healthy lifestyle and preventative care can promote health and wellness.  Maintain regular health, dental, and eye exams.  Eat a healthy diet. Foods like vegetables, fruits, whole grains, low-fat dairy products, and lean protein foods contain the nutrients you need without too many calories. Decrease your intake of foods high in solid fats, added sugars, and salt. Get information about a proper diet from your caregiver, if necessary.  Regular physical exercise is one of the most important things you can do for your health. Most adults should get at least 150 minutes of moderate-intensity exercise (any activity that increases your heart rate and causes you to sweat) each week. In addition, most adults need muscle-strengthening exercises on 2 or more days a week.   Maintain a healthy weight. The body mass index (BMI) is a screening tool to identify possible weight problems. It provides an estimate of body fat based on height and weight. Your caregiver can help determine your BMI, and can help you achieve or maintain a healthy weight. For adults 20 years and older:  A BMI below 18.5 is considered underweight.  A BMI of 18.5 to 24.9 is normal.  A BMI of 25 to 29.9 is considered overweight.  A BMI of 30 and above is considered obese.  Maintain normal blood lipids and cholesterol by exercising and minimizing your intake of saturated fat. Eat a balanced diet with plenty of fruits and vegetables. Blood tests for lipids and cholesterol should begin at age 61 and be repeated every 5 years. If your lipid or cholesterol levels are high, you are over 50, or you are a high risk for heart  disease, you may need your cholesterol levels checked more frequently.Ongoing high lipid and cholesterol levels should be treated with medicines if diet and exercise are not effective.  If you smoke, find out from your caregiver how to quit. If you do not use tobacco, do not start.  Lung cancer screening is recommended for adults aged 33 80 years who are at high risk for developing lung cancer because of a history of smoking. Yearly low-dose computed tomography (CT) is recommended for people who have at least a 30-pack-year history of smoking and are a current smoker or have quit within the past 15 years. A pack year of smoking is smoking an average of 1 pack of cigarettes a day for 1 year (for example: 1 pack a day for 30 years or 2 packs a day for 15 years). Yearly screening should continue until the smoker has stopped smoking for at least 15 years. Yearly screening should also be stopped for people who develop a health problem that would prevent them from having lung cancer treatment.  If you are pregnant, do not drink alcohol. If you are breastfeeding, be very cautious about drinking alcohol. If you are not pregnant and choose to drink alcohol, do not exceed 1 drink per day. One drink is considered to be 12 ounces (355 mL) of beer, 5 ounces (148 mL) of wine, or 1.5 ounces (44 mL) of liquor.  Avoid use of street drugs. Do not share needles with anyone. Ask for help if you need support or instructions about stopping  the use of drugs.  High blood pressure causes heart disease and increases the risk of stroke. Blood pressure should be checked at least every 1 to 2 years. Ongoing high blood pressure should be treated with medicines, if weight loss and exercise are not effective.  If you are 67 to 26 years old, ask your caregiver if you should take aspirin to prevent strokes.  Diabetes screening involves taking a blood sample to check your fasting blood sugar level. This should be done once every 3  years, after age 63, if you are within normal weight and without risk factors for diabetes. Testing should be considered at a younger age or be carried out more frequently if you are overweight and have at least 1 risk factor for diabetes.  Breast cancer screening is essential preventative care for women. You should practice "breast self-awareness." This means understanding the normal appearance and feel of your breasts and may include breast self-examination. Any changes detected, no matter how small, should be reported to a caregiver. Women in their 93s and 30s should have a clinical breast exam (CBE) by a caregiver as part of a regular health exam every 1 to 3 years. After age 57, women should have a CBE every year. Starting at age 14, women should consider having a mammogram (breast X-ray) every year. Women who have a family history of breast cancer should talk to their caregiver about genetic screening. Women at a high risk of breast cancer should talk to their caregiver about having an MRI and a mammogram every year.  Breast cancer gene (BRCA)-related cancer risk assessment is recommended for women who have family members with BRCA-related cancers. BRCA-related cancers include breast, ovarian, tubal, and peritoneal cancers. Having family members with these cancers may be associated with an increased risk for harmful changes (mutations) in the breast cancer genes BRCA1 and BRCA2. Results of the assessment will determine the need for genetic counseling and BRCA1 and BRCA2 testing.  The Pap test is a screening test for cervical cancer. Women should have a Pap test starting at age 2. Between ages 18 and 66, Pap tests should be repeated every 2 years. Beginning at age 73, you should have a Pap test every 3 years as long as the past 3 Pap tests have been normal. If you had a hysterectomy for a problem that was not cancer or a condition that could lead to cancer, then you no longer need Pap tests. If you are  between ages 17 and 19, and you have had normal Pap tests going back 10 years, you no longer need Pap tests. If you have had past treatment for cervical cancer or a condition that could lead to cancer, you need Pap tests and screening for cancer for at least 20 years after your treatment. If Pap tests have been discontinued, risk factors (such as a new sexual partner) need to be reassessed to determine if screening should be resumed. Some women have medical problems that increase the chance of getting cervical cancer. In these cases, your caregiver may recommend more frequent screening and Pap tests.  The human papillomavirus (HPV) test is an additional test that may be used for cervical cancer screening. The HPV test looks for the virus that can cause the cell changes on the cervix. The cells collected during the Pap test can be tested for HPV. The HPV test could be used to screen women aged 27 years and older, and should be used in women of any age  who have unclear Pap test results. After the age of 30, women should have HPV testing at the same frequency as a Pap test.  Colorectal cancer can be detected and often prevented. Most routine colorectal cancer screening begins at the age of 50 and continues through age 75. However, your caregiver may recommend screening at an earlier age if you have risk factors for colon cancer. On a yearly basis, your caregiver may provide home test kits to check for hidden blood in the stool. Use of a small camera at the end of a tube, to directly examine the colon (sigmoidoscopy or colonoscopy), can detect the earliest forms of colorectal cancer. Talk to your caregiver about this at age 50, when routine screening begins. Direct examination of the colon should be repeated every 5 to 10 years through age 75, unless early forms of pre-cancerous polyps or small growths are found.  Hepatitis C blood testing is recommended for all people born from 1945 through 1965 and any  individual with known risks for hepatitis C.  Practice safe sex. Use condoms and avoid high-risk sexual practices to reduce the spread of sexually transmitted infections (STIs). Sexually active women aged 25 and younger should be checked for Chlamydia, which is a common sexually transmitted infection. Older women with new or multiple partners should also be tested for Chlamydia. Testing for other STIs is recommended if you are sexually active and at increased risk.  Osteoporosis is a disease in which the bones lose minerals and strength with aging. This can result in serious bone fractures. The risk of osteoporosis can be identified using a bone density scan. Women ages 65 and over and women at risk for fractures or osteoporosis should discuss screening with their caregivers. Ask your caregiver whether you should be taking a calcium supplement or vitamin D to reduce the rate of osteoporosis.  Menopause can be associated with physical symptoms and risks. Hormone replacement therapy is available to decrease symptoms and risks. You should talk to your caregiver about whether hormone replacement therapy is right for you.  Use sunscreen. Apply sunscreen liberally and repeatedly throughout the day. You should seek shade when your shadow is shorter than you. Protect yourself by wearing long sleeves, pants, a wide-brimmed hat, and sunglasses year round, whenever you are outdoors.  Notify your caregiver of new moles or changes in moles, especially if there is a change in shape or color. Also notify your caregiver if a mole is larger than the size of a pencil eraser.  Stay current with your immunizations. Document Released: 07/14/2010 Document Revised: 04/25/2012 Document Reviewed: 07/14/2010 [employer] Patient Information 2014 [employer].   

## 2014-04-04 LAB — URINALYSIS W MICROSCOPIC + REFLEX CULTURE
Bilirubin Urine: NEGATIVE
Casts: NONE SEEN
Crystals: NONE SEEN
Glucose, UA: NEGATIVE mg/dL
HGB URINE DIPSTICK: NEGATIVE
Ketones, ur: NEGATIVE mg/dL
Leukocytes, UA: NEGATIVE
NITRITE: NEGATIVE
PROTEIN: NEGATIVE mg/dL
Specific Gravity, Urine: 1.024 (ref 1.005–1.030)
Urobilinogen, UA: 0.2 mg/dL (ref 0.0–1.0)
pH: 7.5 (ref 5.0–8.0)

## 2014-08-21 ENCOUNTER — Ambulatory Visit (INDEPENDENT_AMBULATORY_CARE_PROVIDER_SITE_OTHER): Payer: 59 | Admitting: Gynecology

## 2014-08-21 ENCOUNTER — Encounter: Payer: Self-pay | Admitting: Gynecology

## 2014-08-21 VITALS — BP 120/74

## 2014-08-21 DIAGNOSIS — R102 Pelvic and perineal pain: Secondary | ICD-10-CM

## 2014-08-21 DIAGNOSIS — N898 Other specified noninflammatory disorders of vagina: Secondary | ICD-10-CM | POA: Diagnosis not present

## 2014-08-21 LAB — URINALYSIS W MICROSCOPIC + REFLEX CULTURE
BILIRUBIN URINE: NEGATIVE
CRYSTALS: NONE SEEN [HPF]
Casts: NONE SEEN [LPF]
Glucose, UA: NEGATIVE
Hgb urine dipstick: NEGATIVE
KETONES UR: NEGATIVE
Leukocytes, UA: NEGATIVE
Nitrite: NEGATIVE
PH: 5.5 (ref 5.0–8.0)
PROTEIN: NEGATIVE
RBC / HPF: NONE SEEN RBC/HPF (ref <=2)
Specific Gravity, Urine: 1.025 (ref 1.001–1.035)
WBC UA: NONE SEEN WBC/HPF (ref <=5)
Yeast: NONE SEEN [HPF]

## 2014-08-21 LAB — WET PREP FOR TRICH, YEAST, CLUE
Clue Cells Wet Prep HPF POC: NONE SEEN
TRICH WET PREP: NONE SEEN

## 2014-08-21 LAB — PREGNANCY, URINE: Preg Test, Ur: NEGATIVE

## 2014-08-21 MED ORDER — FLUCONAZOLE 150 MG PO TABS: 150.0000 mg | ORAL_TABLET | Freq: Once | ORAL | Status: DC

## 2014-08-21 NOTE — Patient Instructions (Addendum)
Push fluids and keep your self well hydrated. Take the one Diflucan pill for the yeast infection. Call if your pelvic pain returns.

## 2014-08-21 NOTE — Progress Notes (Signed)
Brooke Howell [birth year] [id number]        24 y.o.  G1P0010 presents complaining of episode of sharp pelvic pain occurring during attempted bowel movement 3 days ago. Now has resolved. Similar episode previously after taking a laxative. Due for menses now. Using condom contraception. Notes slight vaginal discharge over the last several days.  No frequency dysuria or urgency. No nausea vomiting.  Past medical history,surgical history, problem list, medications, allergies, family history and social history were all reviewed and documented in the EPIC chart.  Directed ROS with pertinent positives and negatives documented in the history of present illness/assessment and plan.  Exam: [name] assistant Filed Vitals:   08/21/14 1229  BP: 120/74   General appearance:  Normal Abdomen soft nontender without masses guarding rebound. Active bowel sounds throughout. Pelvic external be was vagina with thick white discharge. Cervix normal. GC/chlamydia screen done. Uterus normal size midline mobile nontender. Adnexa without masses or tenderness. Rectal exam is normal.  Assessment/Plan:  24 y.o. G1P0010 with:  1. Vaginal discharge. Wet prep positive for yeast. Will treat with Diflucan 150 mg 1 dose. Follow up if symptoms persist, worsen or recur. 2. Pelvic pain during attempted bowel movement. Exam is normal now. Urinalysis shows few bacteria with 6-10 squamous cells. Will culture and await results. UPT negative. Suspect probable bowel spasm possible ovarian cyst now resolved. Using condom contraception. This is acceptable to the patient. She is no longer having pain we'll monitor for now. If pain recurs she'll call and we'll start with GYN ultrasound to rule out nonpalpable abnormalities.  Assuming she remains pain-free then we will monitor expectantly.    [name] MD, 1:01 PM 08/21/2014

## 2014-08-22 LAB — URINE CULTURE
Colony Count: NO GROWTH
Organism ID, Bacteria: NO GROWTH

## 2014-08-22 LAB — GC/CHLAMYDIA PROBE AMP
CT Probe RNA: NEGATIVE
GC PROBE AMP APTIMA: NEGATIVE

## 2014-09-03 ENCOUNTER — Telehealth: Payer: Self-pay | Admitting: *Deleted

## 2014-09-03 NOTE — Telephone Encounter (Signed)
Pt voicemail full.

## 2014-09-03 NOTE — Telephone Encounter (Signed)
Find out when was her last menstrual period and is she using for contraception

## 2014-09-03 NOTE — Telephone Encounter (Signed)
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  She said TF told her on wet prep very small amount was noted, pt said she now has odor and vaginal discharge. Asked if you send Rx to pharmacy for BV? OV offered. Please advise

## 2014-09-07 ENCOUNTER — Ambulatory Visit: Payer: 59 | Admitting: Women's Health

## 2014-09-11 ENCOUNTER — Ambulatory Visit: Payer: 59 | Admitting: Gynecology

## 2014-09-20 ENCOUNTER — Ambulatory Visit (INDEPENDENT_AMBULATORY_CARE_PROVIDER_SITE_OTHER): Payer: 59 | Admitting: Gynecology

## 2014-09-20 ENCOUNTER — Encounter: Payer: Self-pay | Admitting: Gynecology

## 2014-09-20 VITALS — BP 126/80 | Ht 67.0 in | Wt 222.0 lb

## 2014-09-20 DIAGNOSIS — Z113 Encounter for screening for infections with a predominantly sexual mode of transmission: Secondary | ICD-10-CM

## 2014-09-20 DIAGNOSIS — B9689 Other specified bacterial agents as the cause of diseases classified elsewhere: Secondary | ICD-10-CM

## 2014-09-20 DIAGNOSIS — N760 Acute vaginitis: Secondary | ICD-10-CM | POA: Diagnosis not present

## 2014-09-20 DIAGNOSIS — N3000 Acute cystitis without hematuria: Secondary | ICD-10-CM | POA: Diagnosis not present

## 2014-09-20 DIAGNOSIS — N9489 Other specified conditions associated with female genital organs and menstrual cycle: Secondary | ICD-10-CM

## 2014-09-20 DIAGNOSIS — N898 Other specified noninflammatory disorders of vagina: Secondary | ICD-10-CM

## 2014-09-20 DIAGNOSIS — A499 Bacterial infection, unspecified: Secondary | ICD-10-CM

## 2014-09-20 LAB — URINALYSIS W MICROSCOPIC + REFLEX CULTURE
Bilirubin Urine: NEGATIVE
Casts: NONE SEEN [LPF]
Crystals: NONE SEEN [HPF]
Glucose, UA: NEGATIVE
Ketones, ur: NEGATIVE
Leukocytes, UA: NEGATIVE
Nitrite: NEGATIVE
Protein, ur: NEGATIVE
SPECIFIC GRAVITY, URINE: 1.025 (ref 1.001–1.035)
WBC, UA: NONE SEEN WBC/HPF (ref <=5)
Yeast: NONE SEEN [HPF]
pH: 5.5 (ref 5.0–8.0)

## 2014-09-20 LAB — WET PREP FOR TRICH, YEAST, CLUE
Trich, Wet Prep: NONE SEEN
WBC WET PREP: NONE SEEN
YEAST WET PREP: NONE SEEN

## 2014-09-20 MED ORDER — SULFAMETHOXAZOLE-TRIMETHOPRIM 800-160 MG PO TABS: 1.0000 | ORAL_TABLET | Freq: Two times a day (BID) | ORAL | Status: DC

## 2014-09-20 MED ORDER — METRONIDAZOLE 500 MG PO TABS: 500.0000 mg | ORAL_TABLET | Freq: Two times a day (BID) | ORAL | Status: DC

## 2014-09-20 MED ORDER — FLUCONAZOLE 150 MG PO TABS: 150.0000 mg | ORAL_TABLET | Freq: Once | ORAL | Status: DC

## 2014-09-20 NOTE — Progress Notes (Addendum)
Brooke Howell 09-29-1990 [id number]        24 y.o.  G1P0010 presents with urinary frequency over the past several days. No urgency or dysuria. No low back pain fever or chills nausea or vomiting. Also noted some vaginal odor. No discharge or irritation. Recently treated for yeast 08/21/2014 and said those symptoms resolved but now she developed these new symptoms.  Past medical history,surgical history, problem list, medications, allergies, family history and social history were all reviewed and documented in the EPIC chart.  Directed ROS with pertinent positives and negatives documented in the history of present illness/assessment and plan.  Exam: Bari Mantis assistant Filed Vitals:   09/20/14 1417  BP: 126/80  Height:  (1.702 m)  Weight: 222 lb (100.699 kg)   General appearance:  Normal Cervix grossly Spine straight without CVA tenderness Abdomen soft nontender without masses guarding rebound Pelvic external BUS vagina with heavy white discharge.  Cervix grossly normal. Uterus normal size midline mobile nontender. Adnexa without masses or tenderness.  Assessment/Plan:  24 y.o. G1P0010 with above history.  Wet prep is positive for bacterial vaginosis. Will treat with Flagyl 500 mg twice a day 7 days. Follow up if symptoms persist worsen or recur. Alcohol avoidance discussed.  Urine analysis does show many bacteria and am going to cover her with Septra DS 1 by mouth twice a day 3 days. She did ask for a prophylactic Diflucan as she tends to get yeast infections after oral antibiotics and Diflucan 1501 dose also provided to take the end of her antibiotic course. Patient does state that she is going to try to get pregnant and we discussed timing and the need to also start on a prenatal vitamin now and she agrees to do so.  I also did a GC/Chlamydia screen at her request.    [name] MD, 2:44 PM 09/20/2014

## 2014-09-20 NOTE — Patient Instructions (Addendum)
Take the Septra antibiotics twice daily for 3 days for the urinary tract infection. Take the Flagyl twice daily for 7 days for the bacterial vaginal infection. Take the Diflucan at the end of your antibiotics just to prevent a yeast infection. Start on a prenatal vitamin daily.  Follow up if your symptoms persist, worsen or recur.

## 2014-09-21 LAB — URINE CULTURE: Colony Count: 35000

## 2014-09-21 LAB — GC/CHLAMYDIA PROBE AMP
CT Probe RNA: NEGATIVE
GC Probe RNA: NEGATIVE

## 2014-09-24 ENCOUNTER — Telehealth: Payer: Self-pay

## 2014-09-24 NOTE — Telephone Encounter (Addendum)
Patient complained of terrible nausea with Metronidazole. Feels so horrible she cannot stay at work. She has taken in the past with no problems. She is on her 4th day of it. I told her to d/c taking it, drink plenty of fluids and I will see what Dr. Velvet Bathe recommends.  Patient was informed Dr. Velvet Bathe not here today so will likely be tomorrow morning before I call her back with his recommendation.

## 2014-09-24 NOTE — Telephone Encounter (Signed)
Encounter already opened. 

## 2014-09-24 NOTE — Telephone Encounter (Signed)
Patient called stating she received Rx last week for Metronidazole. She said she today is 4th day of it and she is feeling really sick and wanted to talk with someone about it.  I called her back but no answer and her voice mail is not set up so I was unable to leave a message for her.

## 2014-09-25 ENCOUNTER — Other Ambulatory Visit: Payer: Self-pay | Admitting: Gynecology

## 2014-09-25 MED ORDER — CLINDAMYCIN PHOSPHATE 2 % VA CREA: 1.0000 | TOPICAL_CREAM | Freq: Every day | VAGINAL | Status: DC

## 2014-09-25 NOTE — Telephone Encounter (Signed)
Tried to call her. No voice mail. I cannot leave a message. Rx has been sent.

## 2014-09-25 NOTE — Telephone Encounter (Signed)
Cleocin vaginal cream nightly 7 days

## 2014-10-18 ENCOUNTER — Ambulatory Visit (INDEPENDENT_AMBULATORY_CARE_PROVIDER_SITE_OTHER): Payer: 59 | Admitting: Women's Health

## 2014-10-18 ENCOUNTER — Encounter: Payer: Self-pay | Admitting: Women's Health

## 2014-10-18 VITALS — BP 120/76

## 2014-10-18 DIAGNOSIS — N760 Acute vaginitis: Secondary | ICD-10-CM

## 2014-10-18 DIAGNOSIS — A499 Bacterial infection, unspecified: Secondary | ICD-10-CM

## 2014-10-18 DIAGNOSIS — B9689 Other specified bacterial agents as the cause of diseases classified elsewhere: Secondary | ICD-10-CM

## 2014-10-18 LAB — WET PREP FOR TRICH, YEAST, CLUE
TRICH WET PREP: NONE SEEN
WBC, Wet Prep HPF POC: NONE SEEN
Yeast Wet Prep HPF POC: NONE SEEN

## 2014-10-18 MED ORDER — TINIDAZOLE 500 MG PO TABS: 500.0000 mg | ORAL_TABLET | Freq: Once | ORAL | Status: DC

## 2014-10-18 NOTE — Patient Instructions (Signed)

## 2014-10-18 NOTE — Progress Notes (Signed)
Patient ID: Brooke Howell, female   DOB: July 09, 1990, 24 y.o.   MRN: [id number] Presents with a 1 week history of urinary urgency, frequency, vaginal odor, and dyspareunia.  Denies discharge, itching, burning or dysuria.  Treated for BV and a UTI September 9, but had to stop cipro and flagyl early due to an episode of dizziness and nausea.  Same partner with negative GC/Chlamydia 9/16.  Monthly cycles using no contraception pregnancy okay. Taking prenatal vitamin daily  Exam: Appears well.  External genitalia normal.  Speculum exam reveals non-erythematous vaginal walls, white discharge with odor.   Wet Prep: Clue cells: Moderate, Bacteria: Many  Bacterial Vaginosis  Plan: Prescribed Tindamax , BID, x 5 days, alcohol precautions. Boric acid gel caps, vaginally twice a week after completing Tindamax and symptoms resolve, has used in the past with good results..  UA pending.  Call if symptoms persist or worsen.

## 2014-10-19 ENCOUNTER — Telehealth: Payer: Self-pay | Admitting: *Deleted

## 2014-10-19 ENCOUNTER — Telehealth: Payer: Self-pay

## 2014-10-19 ENCOUNTER — Other Ambulatory Visit: Payer: Self-pay

## 2014-10-19 DIAGNOSIS — N760 Acute vaginitis: Principal | ICD-10-CM

## 2014-10-19 DIAGNOSIS — B9689 Other specified bacterial agents as the cause of diseases classified elsewhere: Secondary | ICD-10-CM

## 2014-10-19 MED ORDER — TINIDAZOLE 500 MG PO TABS: ORAL_TABLET | ORAL | Status: DC

## 2014-10-19 NOTE — Telephone Encounter (Signed)
ERROR

## 2014-10-19 NOTE — Telephone Encounter (Signed)
Patient said she has been trying since yesterday to pick up her Rx but pharmacy will not let her have it because there are two sets of directions. She needs [place] to call and straighten it out. I called and gave them correct instruction one bid x 5 days. Harriett Sine had left the "one daily" setting on so indeed there were two instructions but there were ten pills.  I apologized to patient for the confusion and let her know it was corrected.

## 2014-11-15 ENCOUNTER — Encounter (HOSPITAL_COMMUNITY): Payer: Self-pay | Admitting: Emergency Medicine

## 2014-11-15 ENCOUNTER — Emergency Department (INDEPENDENT_AMBULATORY_CARE_PROVIDER_SITE_OTHER)
Admission: EM | Admit: 2014-11-15 | Discharge: 2014-11-15 | Disposition: A | Discharge status: Home / Self Care | Payer: 59 | Source: Home / Self Care

## 2014-11-15 DIAGNOSIS — R21 Rash and other nonspecific skin eruption: Secondary | ICD-10-CM

## 2014-11-15 DIAGNOSIS — L282 Other prurigo: Secondary | ICD-10-CM

## 2014-11-15 MED ORDER — HYDROXYZINE HCL 25 MG PO TABS: 25.0000 mg | ORAL_TABLET | Freq: Four times a day (QID) | ORAL | Status: DC

## 2014-11-15 MED ORDER — METHYLPREDNISOLONE 4 MG PO TBPK: ORAL_TABLET | ORAL | Status: DC
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
## 2014-11-15 NOTE — ED Notes (Signed)
Pt states she started with hives last Friday.  On Tuesday she started with the sweats and by today she has vomitted once after lunch and has a headache.  She has taken Benadryl and Claritin with no relief.  She states she is only allergic to seafood, latex, and Penicillin and has not had any of the above.

## 2015-01-03 ENCOUNTER — Encounter (HOSPITAL_COMMUNITY): Payer: Self-pay | Admitting: *Deleted

## 2015-01-03 ENCOUNTER — Emergency Department (HOSPITAL_COMMUNITY)
Admission: EM | Admit: 2015-01-03 | Discharge: 2015-01-04 | Disposition: A | Discharge status: Home / Self Care | Payer: 59 | Attending: Emergency Medicine | Admitting: Emergency Medicine

## 2015-01-03 DIAGNOSIS — R1031 Right lower quadrant pain: Secondary | ICD-10-CM | POA: Diagnosis present

## 2015-01-03 DIAGNOSIS — T148XXA Other injury of unspecified body region, initial encounter: Secondary | ICD-10-CM

## 2015-01-03 DIAGNOSIS — F17210 Nicotine dependence, cigarettes, uncomplicated: Secondary | ICD-10-CM | POA: Diagnosis not present

## 2015-01-03 DIAGNOSIS — Z79899 Other long term (current) drug therapy: Secondary | ICD-10-CM | POA: Insufficient documentation

## 2015-01-03 DIAGNOSIS — T148 Other injury of unspecified body region: Secondary | ICD-10-CM | POA: Diagnosis not present

## 2015-01-03 DIAGNOSIS — Z3202 Encounter for pregnancy test, result negative: Secondary | ICD-10-CM | POA: Insufficient documentation

## 2015-01-03 DIAGNOSIS — Z91040 Latex allergy status: Secondary | ICD-10-CM | POA: Diagnosis not present

## 2015-01-03 DIAGNOSIS — N760 Acute vaginitis: Secondary | ICD-10-CM | POA: Diagnosis not present

## 2015-01-03 DIAGNOSIS — Z880 Allergy status to penicillin: Secondary | ICD-10-CM | POA: Diagnosis not present

## 2015-01-03 DIAGNOSIS — B9689 Other specified bacterial agents as the cause of diseases classified elsewhere: Secondary | ICD-10-CM

## 2015-01-03 DIAGNOSIS — R102 Pelvic and perineal pain: Secondary | ICD-10-CM

## 2015-01-03 LAB — CBC
HCT: 36.3 % (ref 36.0–46.0)
HEMOGLOBIN: 11.9 g/dL — AB (ref 12.0–15.0)
MCH: 29.3 pg (ref 26.0–34.0)
MCHC: 32.8 g/dL (ref 30.0–36.0)
MCV: 89.4 fL (ref 78.0–100.0)
Platelets: 311 10*3/uL (ref 150–400)
RBC: 4.06 MIL/uL (ref 3.87–5.11)
RDW: 13.2 % (ref 11.5–15.5)
WBC: 5.9 10*3/uL (ref 4.0–10.5)

## 2015-01-03 LAB — COMPREHENSIVE METABOLIC PANEL
ALBUMIN: 3.5 g/dL (ref 3.5–5.0)
ALK PHOS: 76 U/L (ref 38–126)
ALT: 17 U/L (ref 14–54)
ANION GAP: 10 (ref 5–15)
AST: 20 U/L (ref 15–41)
BILIRUBIN TOTAL: 0.5 mg/dL (ref 0.3–1.2)
BUN: 10 mg/dL (ref 6–20)
CALCIUM: 9.3 mg/dL (ref 8.9–10.3)
CO2: 26 mmol/L (ref 22–32)
CREATININE: 1.16 mg/dL — AB (ref 0.44–1.00)
Chloride: 102 mmol/L (ref 101–111)
GFR calc Af Amer: >60 mL/min (ref >60)
GFR calc non Af Amer: >60 mL/min (ref >60)
GLUCOSE: 121 mg/dL — AB (ref 65–99)
Potassium: 3.3 mmol/L — ABNORMAL LOW (ref 3.5–5.1)
Sodium: 138 mmol/L (ref 135–145)
TOTAL PROTEIN: 7.7 g/dL (ref 6.5–8.1)

## 2015-01-03 LAB — URINALYSIS, ROUTINE W REFLEX MICROSCOPIC
BILIRUBIN URINE: NEGATIVE
Glucose, UA: NEGATIVE mg/dL
HGB URINE DIPSTICK: NEGATIVE
KETONES UR: NEGATIVE mg/dL
Leukocytes, UA: NEGATIVE
NITRITE: NEGATIVE
PH: 5.5 (ref 5.0–8.0)
Protein, ur: NEGATIVE mg/dL
SPECIFIC GRAVITY, URINE: 1.026 (ref 1.005–1.030)

## 2015-01-03 LAB — LIPASE, BLOOD: Lipase: 18 U/L (ref 11–51)

## 2015-01-03 LAB — POC URINE PREG, ED: PREG TEST UR: NEGATIVE

## 2015-01-03 MED ORDER — IBUPROFEN 800 MG PO TABS
800.0000 mg | ORAL_TABLET | Freq: Once | ORAL | Status: DC
  Filled 2015-01-03: qty 1
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
## 2015-01-03 NOTE — ED Notes (Signed)
The pt is c/o rlq pain  Since this am with nausea no vomiting  Last bm earlier today normal.  lmp dec 9th

## 2015-01-04 ENCOUNTER — Emergency Department (HOSPITAL_COMMUNITY): Payer: 59

## 2015-01-04 LAB — WET PREP, GENITAL
SPERM: NONE SEEN
Trich, Wet Prep: NONE SEEN
WBC WET PREP: NONE SEEN
YEAST WET PREP: NONE SEEN

## 2015-01-04 LAB — GC/CHLAMYDIA PROBE AMP (~~LOC~~) NOT AT ARMC
CHLAMYDIA, DNA PROBE: NEGATIVE
Neisseria Gonorrhea: NEGATIVE

## 2015-01-04 MED ORDER — METRONIDAZOLE 500 MG PO TABS: 500.0000 mg | ORAL_TABLET | Freq: Two times a day (BID) | ORAL | Status: DC

## 2015-01-04 NOTE — Discharge Instructions (Signed)
Your pain today is more likely due to muscle strain. Take Motrin and Tylenol as needed for pain control. He can also apply ice packs to the area while at rest.  please take antibiotics as prescribed. Return without fail for worsening symptoms including fever, worsening pain, vomiting unable to keep down food or fluids, or any other symptoms concerning to you.  Bacterial Vaginosis Bacterial vaginosis is a vaginal infection that occurs when the normal balance of bacteria in the vagina is disrupted. It results from an overgrowth of certain bacteria. This is the most common vaginal infection in women of childbearing age. Treatment is important to prevent complications, especially in pregnant women, as it can cause a premature delivery. CAUSES  Bacterial vaginosis is caused by an increase in harmful bacteria that are normally present in smaller amounts in the vagina. Several different kinds of bacteria can cause bacterial vaginosis. However, the reason that the condition develops is not fully understood. RISK FACTORS Certain activities or behaviors can put you at an increased risk of developing bacterial vaginosis, including:  Having a new sex partner or multiple sex partners.  Douching.  Using an intrauterine device (IUD) for contraception. Women do not get bacterial vaginosis from toilet seats, bedding, swimming pools, or contact with objects around them. SIGNS AND SYMPTOMS  Some women with bacterial vaginosis have no signs or symptoms. Common symptoms include:  Grey vaginal discharge.  A fishlike odor with discharge, especially after sexual intercourse.  Itching or burning of the vagina and vulva.  Burning or pain with urination. DIAGNOSIS  Your health care provider will take a medical history and examine the vagina for signs of bacterial vaginosis. A sample of vaginal fluid may be taken. Your health care provider will look at this sample under a microscope to check for bacteria and abnormal  cells. A vaginal pH test may also be done.  TREATMENT  Bacterial vaginosis may be treated with antibiotic medicines. These may be given in the form of a pill or a vaginal cream. A second round of antibiotics may be prescribed if the condition comes back after treatment. Because bacterial vaginosis increases your risk for sexually transmitted diseases, getting treated can help reduce your risk for chlamydia, gonorrhea, HIV, and herpes. HOME CARE INSTRUCTIONS   Only take over-the-counter or prescription medicines as directed by your health care provider.  If antibiotic medicine was prescribed, take it as directed. Make sure you finish it even if you start to feel better.  Tell all sexual partners that you have a vaginal infection. They should see their health care provider and be treated if they have problems, such as a mild rash or itching.  During treatment, it is important that you follow these instructions:  Avoid sexual activity or use condoms correctly.  Do not douche.  Avoid alcohol as directed by your health care provider.  Avoid breastfeeding as directed by your health care provider. SEEK MEDICAL CARE IF:   Your symptoms are not improving after 3 days of treatment.  You have increased discharge or pain.  You have a fever. MAKE SURE YOU:   Understand these instructions.  Will watch your condition.  Will get help right away if you are not doing well or get worse. FOR MORE INFORMATION  Centers for Disease Control and Prevention, Division of STD Prevention: SolutionApps.co.zawww.cdc.gov/std American Sexual Health Association (ASHA): www.ashastd.org    This information is not intended to replace advice given to you by your health care provider. Make sure you discuss any  questions you have with your health care provider.   Document Released: 12/29/2004 Document Revised: 01/19/2014 Document Reviewed: 08/10/2012 Elsevier Interactive Patient Education [employer]

## 2015-01-23 ENCOUNTER — Ambulatory Visit (INDEPENDENT_AMBULATORY_CARE_PROVIDER_SITE_OTHER): Payer: BLUE CROSS/BLUE SHIELD | Admitting: Gynecology

## 2015-01-23 ENCOUNTER — Telehealth: Payer: Self-pay | Admitting: *Deleted

## 2015-01-23 ENCOUNTER — Encounter: Payer: Self-pay | Admitting: Gynecology

## 2015-01-23 VITALS — BP 120/76

## 2015-01-23 DIAGNOSIS — N63 Unspecified lump in unspecified breast: Secondary | ICD-10-CM

## 2015-01-23 DIAGNOSIS — R21 Rash and other nonspecific skin eruption: Secondary | ICD-10-CM

## 2015-01-23 DIAGNOSIS — N6320 Unspecified lump in the left breast, unspecified quadrant: Secondary | ICD-10-CM

## 2015-01-23 NOTE — Telephone Encounter (Signed)
-----   Message from Dara Lordsimothy P [name], MD sent at 01/23/2015 12:10 PM EST ----- Schedule diagnostic mammogram and ultrasound. Reference new onset left breast mass 2:00 position several finger breast from areola and tail of Spence. Questionable fibroadenoma.

## 2015-01-23 NOTE — Patient Instructions (Signed)
You will hear but scheduling the breast studies within the next week or so. If you do not hear from the breast center call us. Schedule appointment to see a dermatologist in reference to your rash.

## 2015-01-23 NOTE — Telephone Encounter (Signed)
Appointment 01/25/15 @ 10:40am at breast center pt aware.

## 2015-01-23 NOTE — Progress Notes (Signed)
Hershal CoriaBrittney Rounsaville 02/08/1990 [id number]        25 y.o.  G1P0010 presents with several issues:  1. Newly discovered left breast lump. Patient notes last night on self breast exams a lump in her left breast. Never noticed this before. It is not painful or symptomatic. No nipple discharge. 2. Generalized skin rash over trunk and extremities. Saw her primary physician who did some blood work and started her on a cream but this does not seem to help the rash seems to spread. 3. Trying to get pregnant for the last 6 months. Having monthly light menses. Is on a prenatal vitamin  Past medical history,surgical history, problem list, medications, allergies, family history and social history were all reviewed and documented in the EPIC chart.  Directed ROS with pertinent positives and negatives documented in the history of present illness/assessment and plan.  Exam: [name] Filed Vitals:   01/23/15 1149  BP: 120/76   General appearance:  Normal Skin with hyperpigmented papular rash over trunk and extremities. Breasts examined lying and sitting. Right without masses, retractions, discharge, adenopathy. Left with small pea-sized nodule 2:00 position several fingerbreadths off the areola into the tail of Spence. Subcutaneous in location although not connected to the skin. No overlying skin changes, nipple discharge or axillary adenopathy.   Assessment/Plan:  [age] y.o. G1P0010 with:  1. Small breast mass left breast. Suspect physiologic versus small fibroadenoma. We'll start with diagnostic mammogram/ultrasound. Various scenarios to include expectant management, needle biopsy, excisional biopsy reviewed. Patient knows to expect a call from the breast center to schedule. Nose to call us if she does not hear within a week. 2. Generalized skin rash. Seen by her primary physician. Recommended she follow up with dermatologist as it seems to be spreading and not responding to the treatment from her  primary physician. Patient agrees to call. I gave her names of dermatologists in the area. 3. Trying to get pregnant. Having monthly light menses. Reviewed trial of ovulation predictor kits. She does have a 25 day cycle with anticipated ovulation around day 11. If without pregnancy over the next several cycles will pursue a more involved evaluation to include hormonal studies, ovulation confirmation, HSG, semen analysis. She does have a history of chlamydia in the past. Continue on prenatal vitamins as she is taking.    Dara LordsFONTAINE,Jora Galluzzo P MD, 12:04 PM 01/23/2015

## 2015-01-25 ENCOUNTER — Ambulatory Visit
Admission: RE | Admit: 2015-01-25 | Discharge: 2015-01-25 | Disposition: A | Discharge status: Home / Self Care | Payer: BLUE CROSS/BLUE SHIELD | Source: Ambulatory Visit | Attending: Gynecology | Admitting: Gynecology

## 2015-01-25 ENCOUNTER — Other Ambulatory Visit: Payer: Self-pay

## 2015-01-25 ENCOUNTER — Other Ambulatory Visit: Payer: Self-pay | Admitting: Gynecology

## 2015-01-25 DIAGNOSIS — N6320 Unspecified lump in the left breast, unspecified quadrant: Secondary | ICD-10-CM

## 2015-01-30 ENCOUNTER — Ambulatory Visit
Admission: RE | Admit: 2015-01-30 | Discharge: 2015-01-30 | Disposition: A | Discharge status: Home / Self Care | Payer: BLUE CROSS/BLUE SHIELD | Source: Ambulatory Visit | Attending: Gynecology | Admitting: Gynecology

## 2015-01-30 DIAGNOSIS — N6320 Unspecified lump in the left breast, unspecified quadrant: Secondary | ICD-10-CM

## 2015-04-09 ENCOUNTER — Encounter: Payer: Self-pay | Admitting: Gynecology

## 2015-04-09 ENCOUNTER — Ambulatory Visit (INDEPENDENT_AMBULATORY_CARE_PROVIDER_SITE_OTHER): Payer: BLUE CROSS/BLUE SHIELD | Admitting: Gynecology

## 2015-04-09 VITALS — BP 118/76 | Ht 68.0 in | Wt 223.0 lb

## 2015-04-09 DIAGNOSIS — Z01419 Encounter for gynecological examination (general) (routine) without abnormal findings: Secondary | ICD-10-CM | POA: Diagnosis not present

## 2015-04-09 DIAGNOSIS — Z113 Encounter for screening for infections with a predominantly sexual mode of transmission: Secondary | ICD-10-CM

## 2015-04-09 DIAGNOSIS — N644 Mastodynia: Secondary | ICD-10-CM

## 2015-04-09 NOTE — Patient Instructions (Signed)
The office will call you to arrange the dye study to check if your fallopian tubes are open. We will also prescribe an antibiotic that you will start the night before the test is scheduled and take it twice a day for a total of 5 days.  You may obtain a copy of any labs that were done today by logging onto MyChart as outlined in the instructions provided with your AVS (after visit summary). The office will not call with normal lab results but certainly if there are any significant abnormalities then we will contact you.   Health Maintenance Adopting a healthy lifestyle and getting preventive care can go a long way to promote health and wellness. Talk with your health care provider about what schedule of regular examinations is right for you. This is a good chance for you to check in with your provider about disease prevention and staying healthy. In between checkups, there are plenty of things you can do on your own. Experts have done a lot of research about which lifestyle changes and preventive measures are most likely to keep you healthy. Ask your health care provider for more information. WEIGHT AND DIET  Eat a healthy diet  Be sure to include plenty of vegetables, fruits, low-fat dairy products, and lean protein.  Do not eat a lot of foods high in solid fats, added sugars, or salt.  Get regular exercise. This is one of the most important things you can do for your health.  Most adults should exercise for at least 150 minutes each week. The exercise should increase your heart rate and make you sweat (moderate-intensity exercise).  Most adults should also do strengthening exercises at least twice a week. This is in addition to the moderate-intensity exercise.  Maintain a healthy weight  Body mass index (BMI) is a measurement that can be used to identify possible weight problems. It estimates body fat based on height and weight. Your health care provider can help determine your BMI and help  you achieve or maintain a healthy weight.  For females 67 years of age and older:   A BMI below 18.5 is considered underweight.  A BMI of 18.5 to 24.9 is normal.  A BMI of 25 to 29.9 is considered overweight.  A BMI of 30 and above is considered obese.  Watch levels of cholesterol and blood lipids  You should start having your blood tested for lipids and cholesterol at 27 years of age, then have this test every 5 years.  You may need to have your cholesterol levels checked more often if:  Your lipid or cholesterol levels are high.  You are older than 27 years of age.  You are at high risk for heart disease.  CANCER SCREENING   Lung Cancer  Lung cancer screening is recommended for adults 24-59 years old who are at high risk for lung cancer because of a history of smoking.  A yearly low-dose CT scan of the lungs is recommended for people who:  Currently smoke.  Have quit within the past 15 years.  Have at least a 30-pack-year history of smoking. A pack year is smoking an average of one pack of cigarettes a day for 1 year.  Yearly screening should continue until it has been 15 years since you quit.  Yearly screening should stop if you develop a health problem that would prevent you from having lung cancer treatment.  Breast Cancer  Practice breast self-awareness. This means understanding how your breasts normally  appear and feel.  It also means doing regular breast self-exams. Let your health care provider know about any changes, no matter how small.  If you are in your 20s or 30s, you should have a clinical breast exam (CBE) by a health care provider every 1-3 years as part of a regular health exam.  If you are 27 or older, have a CBE every year. Also consider having a breast X-ray (mammogram) every year.  If you have a family history of breast cancer, talk to your health care provider about genetic screening.  If you are at high risk for breast cancer, talk to  your health care provider about having an MRI and a mammogram every year.  Breast cancer gene (BRCA) assessment is recommended for women who have family members with BRCA-related cancers. BRCA-related cancers include:  Breast.  Ovarian.  Tubal.  Peritoneal cancers.  Results of the assessment will determine the need for genetic counseling and BRCA1 and BRCA2 testing. Cervical Cancer Routine pelvic examinations to screen for cervical cancer are no longer recommended for nonpregnant women who are considered low risk for cancer of the pelvic organs (ovaries, uterus, and vagina) and who do not have symptoms. A pelvic examination may be necessary if you have symptoms including those associated with pelvic infections. Ask your health care provider if a screening pelvic exam is right for you.   The Pap test is the screening test for cervical cancer for women who are considered at risk.  If you had a hysterectomy for a problem that was not cancer or a condition that could lead to cancer, then you no longer need Pap tests.  If you are older than 65 years, and you have had normal Pap tests for the past 10 years, you no longer need to have Pap tests.  If you have had past treatment for cervical cancer or a condition that could lead to cancer, you need Pap tests and screening for cancer for at least 20 years after your treatment.  If you no longer get a Pap test, assess your risk factors if they change (such as having a new sexual partner). This can affect whether you should start being screened again.  Some women have medical problems that increase their chance of getting cervical cancer. If this is the case for you, your health care provider may recommend more frequent screening and Pap tests.  The human papillomavirus (HPV) test is another test that may be used for cervical cancer screening. The HPV test looks for the virus that can cause cell changes in the cervix. The cells collected during the  Pap test can be tested for HPV.  The HPV test can be used to screen women 41 years of age and older. Getting tested for HPV can extend the interval between normal Pap tests from three to five years.  An HPV test also should be used to screen women of any age who have unclear Pap test results.  After 27 years of age, women should have HPV testing as often as Pap tests.  Colorectal Cancer  This type of cancer can be detected and often prevented.  Routine colorectal cancer screening usually begins at 27 years of age and continues through 27 years of age.  Your health care provider may recommend screening at an earlier age if you have risk factors for colon cancer.  Your health care provider may also recommend using home test kits to check for hidden blood in the stool.  A  small camera at the end of a tube can be used to examine your colon directly (sigmoidoscopy or colonoscopy). This is done to check for the earliest forms of colorectal cancer.  Routine screening usually begins at age 76.  Direct examination of the colon should be repeated every 5-10 years through 27 years of age. However, you may need to be screened more often if early forms of precancerous polyps or small growths are found. Skin Cancer  Check your skin from head to toe regularly.  Tell your health care provider about any new moles or changes in moles, especially if there is a change in a mole's shape or color.  Also tell your health care provider if you have a mole that is larger than the size of a pencil eraser.  Always use sunscreen. Apply sunscreen liberally and repeatedly throughout the day.  Protect yourself by wearing long sleeves, pants, a wide-brimmed hat, and sunglasses whenever you are outside. HEART DISEASE, DIABETES, AND HIGH BLOOD PRESSURE   Have your blood pressure checked at least every 1-2 years. High blood pressure causes heart disease and increases the risk of stroke.  If you are between 16  years and 80 years old, ask your health care provider if you should take aspirin to prevent strokes.  Have regular diabetes screenings. This involves taking a blood sample to check your fasting blood sugar level.  If you are at a normal weight and have a low risk for diabetes, have this test once every three years after 27 years of age.  If you are overweight and have a high risk for diabetes, consider being tested at a younger age or more often. PREVENTING INFECTION  Hepatitis B  If you have a higher risk for hepatitis B, you should be screened for this virus. You are considered at high risk for hepatitis B if:  You were born in a country where hepatitis B is common. Ask your health care provider which countries are considered high risk.  Your parents were born in a high-risk country, and you have not been immunized against hepatitis B (hepatitis B vaccine).  You have HIV or AIDS.  You use needles to inject street drugs.  You live with someone who has hepatitis B.  You have had sex with someone who has hepatitis B.  You get hemodialysis treatment.  You take certain medicines for conditions, including cancer, organ transplantation, and autoimmune conditions. Hepatitis C  Blood testing is recommended for:  Everyone born from [birth year] through 1967.  Anyone with known risk factors for hepatitis C. Sexually transmitted infections (STIs)  You should be screened for sexually transmitted infections (STIs) including gonorrhea and chlamydia if:  You are sexually active and are younger than 27 years of age.  You are older than 27 years of age and your health care provider tells you that you are at risk for this type of infection.  Your sexual activity has changed since you were last screened and you are at an increased risk for chlamydia or gonorrhea. Ask your health care provider if you are at risk.  If you do not have HIV, but are at risk, it may be recommended that you take a  prescription medicine daily to prevent HIV infection. This is called pre-exposure prophylaxis (PrEP). You are considered at risk if:  You are sexually active and do not regularly use condoms or know the HIV status of your partner(s).  You take drugs by injection.  You are sexually active with  a partner who has HIV. Talk with your health care provider about whether you are at high risk of being infected with HIV. If you choose to begin PrEP, you should first be tested for HIV. You should then be tested every 3 months for as long as you are taking PrEP.  PREGNANCY   If you are premenopausal and you may become pregnant, ask your health care provider about preconception counseling.  If you may become pregnant, take 400 to 800 micrograms (mcg) of folic acid every day.  If you want to prevent pregnancy, talk to your health care provider about birth control (contraception). OSTEOPOROSIS AND MENOPAUSE   Osteoporosis is a disease in which the bones lose minerals and strength with aging. This can result in serious bone fractures. Your risk for osteoporosis can be identified using a bone density scan.  If you are 41 years of age or older, or if you are at risk for osteoporosis and fractures, ask your health care provider if you should be screened.  Ask your health care provider whether you should take a calcium or vitamin D supplement to lower your risk for osteoporosis.  Menopause may have certain physical symptoms and risks.  Hormone replacement therapy may reduce some of these symptoms and risks. Talk to your health care provider about whether hormone replacement therapy is right for you.  HOME CARE INSTRUCTIONS   Schedule regular health, dental, and eye exams.  Stay current with your immunizations.   Do not use any tobacco products including cigarettes, chewing tobacco, or electronic cigarettes.  If you are pregnant, do not drink alcohol.  If you are breastfeeding, limit how much and  how often you drink alcohol.  Limit alcohol intake to no more than 1 drink per day for nonpregnant women. One drink equals 12 ounces of beer, 5 ounces of wine, or 1 ounces of hard liquor.  Do not use street drugs.  Do not share needles.  Ask your health care provider for help if you need support or information about quitting drugs.  Tell your health care provider if you often feel depressed.  Tell your health care provider if you have ever been abused or do not feel safe at home. Document Released: 07/14/2010 Document Revised: 05/15/2013 Document Reviewed: 11/30/2012 [facility] Patient Information 2015 [place], [place]. This information is not intended to replace advice given to you by your health care provider. Make sure you discuss any questions you have with your health care provider.

## 2015-04-09 NOTE — Progress Notes (Addendum)
    Brooke CoriaBrittney Howell Jan 12, 1991 [id number]        25 y.o.  G1P0010  for annual exam.  Several issues noted below.  Past medical history,surgical history, problem list, medications, allergies, family history and social history were all reviewed and documented as reviewed in the EPIC chart.  ROS:  Performed with pertinent positives and negatives included in the history, assessment and plan.   Additional significant findings :  none   Exam: Brooke PortelaKim Howell assistant Filed Vitals:   04/09/15 1147  BP: 118/76  Height: 5\' 8"  (1.727 m)  Weight: 223 lb (101.152 kg)   General appearance:  Normal affect, orientation and appearance. Skin: Grossly normal HEENT: Without gross lesions.  No cervical or supraclavicular adenopathy. Thyroid normal.  Lungs:  Clear without wheezing, rales or rhonchi Cardiac: RR, without RMG Abdominal:  Soft, nontender, without masses, guarding, rebound, organomegaly or hernia Breasts:  Examined lying and sitting without masses, retractions, discharge or axillary adenopathy. Pelvic:  Ext/BUS/vagina late menses flow.   Cervix normal. Pap smear, GC/chlamydia  Uterus anteverted, normal size, shape and contour, midline and mobile nontender   Adnexa without masses or tenderness    Anus and perineum normal    Assessment/Plan:  25 y.o. 321P0010 female for annual exam with regular menses, without contraception.   1. Contraception. Patient not using contraception and feels that she cannot get pregnant. She asked if we could check to see if her fallopian tubes are open. She does have a past history of chlamydia.  I reviewed with the patient the risk of pregnancy and commended she stay on prenatal vitamins daily preconceptionally. We'll go ahead and arrange for HSG. Risks to include infection with the HSG reviewed. Will prophylax with Vibramycin 100 mg twice a day 5 days to start the night before procedure. Also discussed other possibilities of infertility to include female factor.  Options for referral to reproductive endocrinologist discussed. 2. STD screening. Baseline GC/chlamydia done. Declined serum screening. 3. Pap smear/HPV negative 12/2012. Pap smear done today. 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 Follow up Pap smear 2013 and Pap smear/HPV 2014 negative.  4. Enlarging breasts now causing back pain. Patient notes her breasts are symmetrically enlarging which is causing her a lot of back pain. No abnormalities on self-exam. No discharge. Exam today is normal. We'll check baseline prolactin. Options for breast reductive surgery and referral to plastic surgeon reviewed.  SB month are reviewed. 5. Hair thinning. No frank alopecia. Check baseline thyroid panel.  Referral to dermatology if continues to be an issue. 6. Health maintenance. No routine lab work done as patient reports this recently done at a wellness check.   Brooke LordsFONTAINE,Brooke Howell P MD, 12:10 PM 04/09/2015

## 2015-04-09 NOTE — Addendum Note (Signed)
Addended by: Dayna BarkerGARDNER, Edan Juday K on: 04/09/2015 12:45 PM   Modules accepted: Orders

## 2015-04-10 ENCOUNTER — Telehealth: Payer: Self-pay | Admitting: *Deleted

## 2015-04-10 DIAGNOSIS — N979 Female infertility, unspecified: Secondary | ICD-10-CM

## 2015-04-10 LAB — GC/CHLAMYDIA PROBE AMP
CT Probe RNA: NOT DETECTED
GC Probe RNA: NOT DETECTED

## 2015-04-10 LAB — PAP IG W/ RFLX HPV ASCU

## 2015-04-10 LAB — THYROID PANEL
Free T4: 0.9 ng/dL (ref 0.8–1.8)
T3 UPTAKE: 27 % (ref 22–35)
T4, Total: 5.3 ug/dL (ref 4.5–12.0)
TSH: 1.01 m[IU]/L

## 2015-04-10 LAB — PROLACTIN: PROLACTIN: 8.7 ng/mL

## 2015-04-10 MED ORDER — DOXYCYCLINE HYCLATE 100 MG PO CAPS: ORAL_CAPSULE | ORAL | Status: DC

## 2015-04-10 NOTE — Telephone Encounter (Signed)
-----   Message from Dara Lordsimothy P [name], MD sent at 04/09/2015 12:15 PM EDT ----- Schedule HSG for tubal patency. Prescribe Vibramycin 100 mg twice a day #10 to start the night before the procedure

## 2015-04-10 NOTE — Telephone Encounter (Signed)
Appoint[facility], unable to relay to pt as her voicemail is full, will try back later.

## 2015-04-10 NOTE — Telephone Encounter (Signed)
Pt informed with the below note, Rx sent for vibramycin.

## 2015-04-15 ENCOUNTER — Telehealth: Payer: Self-pay | Admitting: *Deleted

## 2015-04-15 MED ORDER — METRONIDAZOLE 500 MG PO TABS: 500.0000 mg | ORAL_TABLET | Freq: Two times a day (BID) | ORAL | Status: DC

## 2015-04-15 NOTE — Telephone Encounter (Signed)
Pt said she will like the flagyl Rx sent.

## 2015-04-15 NOTE — Telephone Encounter (Signed)
2 options: Either Cleocin vaginal cream nightly 7 nights or Flagyl 500 mg twice a day 7 days and should avoid alcohol if uses the Flagyl. Patient's choice

## 2015-04-15 NOTE — Telephone Encounter (Signed)
Pt was seen on 04/09/15 states she mention to you that if still has vaginal odor to let you know, pt said it has not cleared up yet, she would like Rx for this. Please advise

## 2015-04-17 ENCOUNTER — Encounter (INDEPENDENT_AMBULATORY_CARE_PROVIDER_SITE_OTHER): Payer: Self-pay

## 2015-04-17 ENCOUNTER — Ambulatory Visit (HOSPITAL_COMMUNITY)
Admission: RE | Admit: 2015-04-17 | Discharge: 2015-04-17 | Disposition: A | Discharge status: Home / Self Care | Payer: BLUE CROSS/BLUE SHIELD | Source: Ambulatory Visit | Attending: Gynecology | Admitting: Gynecology

## 2015-04-17 DIAGNOSIS — N979 Female infertility, unspecified: Secondary | ICD-10-CM | POA: Diagnosis not present

## 2015-04-17 MED ORDER — IOPAMIDOL (ISOVUE-300) INJECTION 61%
30.0000 mL | Freq: Once | INTRAVENOUS | Status: AC | PRN
  Administered 2015-04-17: 30 mL

## 2015-05-23 ENCOUNTER — Encounter: Payer: Self-pay | Admitting: Gynecology

## 2015-05-23 ENCOUNTER — Ambulatory Visit (INDEPENDENT_AMBULATORY_CARE_PROVIDER_SITE_OTHER): Payer: BLUE CROSS/BLUE SHIELD | Admitting: Gynecology

## 2015-05-23 VITALS — BP 122/76

## 2015-05-23 DIAGNOSIS — N760 Acute vaginitis: Secondary | ICD-10-CM | POA: Diagnosis not present

## 2015-05-23 DIAGNOSIS — A499 Bacterial infection, unspecified: Secondary | ICD-10-CM | POA: Diagnosis not present

## 2015-05-23 DIAGNOSIS — N898 Other specified noninflammatory disorders of vagina: Secondary | ICD-10-CM | POA: Diagnosis not present

## 2015-05-23 DIAGNOSIS — N9489 Other specified conditions associated with female genital organs and menstrual cycle: Secondary | ICD-10-CM

## 2015-05-23 DIAGNOSIS — B9689 Other specified bacterial agents as the cause of diseases classified elsewhere: Secondary | ICD-10-CM

## 2015-05-23 LAB — WET PREP FOR TRICH, YEAST, CLUE
TRICH WET PREP: NONE SEEN
WBC WET PREP: NONE SEEN
YEAST WET PREP: NONE SEEN

## 2015-05-23 MED ORDER — METRONIDAZOLE 500 MG PO TABS: 500.0000 mg | ORAL_TABLET | Freq: Two times a day (BID) | ORAL | Status: DC

## 2015-05-23 MED ORDER — FLUCONAZOLE 150 MG PO TABS: 150.0000 mg | ORAL_TABLET | Freq: Once | ORAL | Status: DC

## 2015-05-23 NOTE — Addendum Note (Signed)
Addended by: Dayna BarkerGARDNER, KIMBERLY K on: 05/23/2015 10:03 AM   Modules accepted: Orders

## 2015-05-23 NOTE — Progress Notes (Signed)
    Brooke Howell [birth year] [id number]        25 y.o.  G1P0010 Presents with a 1 week history of worsening vaginal discharge and vaginal odor. Also noticing irritation and itching. No urinary symptoms such as frequency, dysuria, urgency. No low back pain, fever or chills. No nausea, vomiting, diarrhea, constipation. 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 Had tried boric acid suppositories but didn't like them and discarded them.  Past medical history,surgical history, problem list, medications, allergies, family history and social history were all reviewed and documented in the EPIC chart.  Directed ROS with pertinent positives and negatives documented in the history of present illness/assessment and plan.  Exam: [name] Filed Vitals:   05/23/15 0935  BP: 122/76   General appearance:  Normal Spine straight without CVA tenderness Abdomen soft nontender without masses guarding rebound Pelvic external BUS vagina with cakey white discharge. Cervix normal. Uterus grossly normal size midline mobile nontender. Adnexa without masses or tenderness.  Assessment/Plan:  25 y.o. G1P0010 with history and exam as above. Had GC/chlamydia screen negative in March. Offered today but declined. Wet prep consistent with bacterial vaginosis. Will treat with Flagyl 500 mg twice a day 7 days at her choice. I'll call voidance reviewed. Given the cakey white discharge and itching" to cover her for yeast with Diflucan 150 mg 1 at the beginning of the Flagyl and one at the end of the Flagyl. Patient will follow up if her symptoms persist, worsen or recur.    Dara LordsFONTAINE,Andilyn Bettcher P MD, 9:53 AM 05/23/2015

## 2015-05-23 NOTE — Patient Instructions (Signed)
Take the Flagyl medication twice daily for 7 days ureteral void alcohol while taking. Take the Diflucan pill at the beginning of the antibiotics and one at the end of the antibiotics.  Follow up if your symptoms persist, worsen or recur

## 2015-06-11 ENCOUNTER — Telehealth: Payer: Self-pay | Admitting: *Deleted

## 2015-06-11 ENCOUNTER — Other Ambulatory Visit: Payer: Self-pay | Admitting: Gynecology

## 2015-06-11 MED ORDER — FLUCONAZOLE 150 MG PO TABS: 150.0000 mg | ORAL_TABLET | Freq: Once | ORAL | Status: DC

## 2015-06-11 NOTE — Telephone Encounter (Signed)
Pt was prescribed Flagyl 500 mg twice a day 7 days and diflucan 150 mg # 2 tablet one now and one tablet after the flagyl. Pt didn't start the flagyl until last week, she has completed the flagyl, but lost the 2nd diflucan tablet given, asked if you could sent 1 tablet in? Please advise

## 2015-06-11 NOTE — Telephone Encounter (Signed)
Rx sent. Patient informed. 

## 2015-06-11 NOTE — Telephone Encounter (Signed)
Okay for the one Diflucan

## 2015-06-21 ENCOUNTER — Ambulatory Visit (INDEPENDENT_AMBULATORY_CARE_PROVIDER_SITE_OTHER): Payer: BLUE CROSS/BLUE SHIELD | Admitting: Gynecology

## 2015-06-21 ENCOUNTER — Encounter: Payer: Self-pay | Admitting: Gynecology

## 2015-06-21 VITALS — BP 116/72

## 2015-06-21 DIAGNOSIS — N912 Amenorrhea, unspecified: Secondary | ICD-10-CM | POA: Diagnosis not present

## 2015-06-21 DIAGNOSIS — R102 Pelvic and perineal pain: Secondary | ICD-10-CM

## 2015-06-21 LAB — PREGNANCY, URINE: Preg Test, Ur: POSITIVE — AB

## 2015-06-21 NOTE — Addendum Note (Signed)
Addended by: Dayna BarkerGARDNER, Arvilla Salada K on: 06/21/2015 01:55 PM   Modules accepted: Orders

## 2015-06-21 NOTE — Progress Notes (Signed)
    Brooke Howell 1990/05/14 [id number]        25 y.o.  G1P0010 presents with positive home pregnancy test. Notes bilateral lower pelvic pulling pains. No bleeding.    Past medical history,surgical history, problem list, medications, allergies, family history and social history were all reviewed and documented in the EPIC chart.  Directed ROS with pertinent positives and negatives documented in the history of present illness/assessment and plan.  Exam: Brooke PortelaKim Howell assistant  Filed Vitals:   06/21/15 1233  BP: 116/72   General appearance:  Normal Abdomen soft nontender without masses guarding rebound Pelvic external BUS vagina normal. Cervix normal. Uterus soft grossly normal size midline mobile nontender. Adnexa without masses or tenderness   Assessment/Plan:  25 y.o. G1P0010 with IUP at approximately 5 weeks by dates. We'll check baseline quantitative hCG and then schedule follow up ultrasound for dating. Is having some lower pelvic discomfort but it is bilateral and pulling in nature consistent with uterine expansion. I did review ectopic risk precautions. Patient understands that we do not do obstetrics and that she will need to establish care with and obstetrical group in town for ongoing care of the next several weeks. Also instructed to start on prenatal vitamins daily and she has a supply at home and will do so.     Brooke Howell,Brooke Howell P MD, 12:44 PM 06/21/2015

## 2015-06-21 NOTE — Patient Instructions (Signed)
Office will call you with blood test results and to arrange for the ultrasound.

## 2015-06-22 LAB — HCG, QUANTITATIVE, PREGNANCY: hCG, Beta Chain, Quant, S: 50356.6 m[IU]/mL — ABNORMAL HIGH

## 2015-06-24 ENCOUNTER — Other Ambulatory Visit: Payer: Self-pay | Admitting: Gynecology

## 2015-06-24 DIAGNOSIS — Z3491 Encounter for supervision of normal pregnancy, unspecified, first trimester: Secondary | ICD-10-CM

## 2015-08-09 ENCOUNTER — Ambulatory Visit (INDEPENDENT_AMBULATORY_CARE_PROVIDER_SITE_OTHER): Payer: BLUE CROSS/BLUE SHIELD | Admitting: Women's Health

## 2015-08-09 VITALS — BP 120/78 | Ht 68.0 in | Wt 223.0 lb

## 2015-08-09 DIAGNOSIS — B9689 Other specified bacterial agents as the cause of diseases classified elsewhere: Secondary | ICD-10-CM

## 2015-08-09 DIAGNOSIS — N760 Acute vaginitis: Secondary | ICD-10-CM

## 2015-08-09 DIAGNOSIS — A499 Bacterial infection, unspecified: Secondary | ICD-10-CM | POA: Diagnosis not present

## 2015-08-09 DIAGNOSIS — N898 Other specified noninflammatory disorders of vagina: Secondary | ICD-10-CM | POA: Diagnosis not present

## 2015-08-09 LAB — WET PREP FOR TRICH, YEAST, CLUE
TRICH WET PREP: NONE SEEN
YEAST WET PREP: NONE SEEN

## 2015-08-09 MED ORDER — METRONIDAZOLE 500 MG PO TABS: 500.0000 mg | ORAL_TABLET | Freq: Two times a day (BID) | ORAL | 0 refills | Status: DC

## 2015-08-09 NOTE — Progress Notes (Signed)
Presents with complaint of vaginal discharge with odor. Was seen here in the office for a positive home UPT 06/15/2015, hCG 50,000, states passed large amount of tissue and blood at home end of June, was seen at [facility] emergency room where labs were done and ultrasound performed that showed  empty uterus. Same partner/breakup. Reviewed unable to pull up lab results or ultrasound report from [facility] system, states did have them.  Exam: Appears well. External genitalia within normal limits, speculum exam moderate amount of a white milky discharge with odor noted, wet prep positive for many clues, TNTC bacteria. GC/Chlamydia culture taken. Bimanual nontender, no CMT. Abdomen obese.  Bacteria vaginosis G2P0  SAB June 2017  Plan: Condolences given, Flagyl 500 twice daily for 7 days #14 alcohol precautions reviewed. Instructed to call if no relief of discharge. Denies need for HIV, hepatitis or RPR. Declines contraception.

## 2015-08-09 NOTE — Patient Instructions (Signed)

## 2015-08-10 LAB — GC/CHLAMYDIA PROBE AMP
CT Probe RNA: NOT DETECTED
GC PROBE AMP APTIMA: NOT DETECTED

## 2016-03-13 ENCOUNTER — Encounter: Payer: Self-pay | Admitting: Women's Health

## 2016-03-13 ENCOUNTER — Ambulatory Visit (INDEPENDENT_AMBULATORY_CARE_PROVIDER_SITE_OTHER): Payer: BLUE CROSS/BLUE SHIELD | Admitting: Women's Health

## 2016-03-13 VITALS — BP 130/84

## 2016-03-13 DIAGNOSIS — Z113 Encounter for screening for infections with a predominantly sexual mode of transmission: Secondary | ICD-10-CM | POA: Diagnosis not present

## 2016-03-13 DIAGNOSIS — B9689 Other specified bacterial agents as the cause of diseases classified elsewhere: Secondary | ICD-10-CM

## 2016-03-13 DIAGNOSIS — N898 Other specified noninflammatory disorders of vagina: Secondary | ICD-10-CM

## 2016-03-13 DIAGNOSIS — N760 Acute vaginitis: Secondary | ICD-10-CM

## 2016-03-13 LAB — HIV ANTIBODY (ROUTINE TESTING W REFLEX): HIV: NONREACTIVE

## 2016-03-13 LAB — WET PREP FOR TRICH, YEAST, CLUE
Trich, Wet Prep: NONE SEEN
Yeast Wet Prep HPF POC: NONE SEEN

## 2016-03-13 LAB — HEPATITIS B SURFACE ANTIGEN: HEP B S AG: NEGATIVE

## 2016-03-13 LAB — HEPATITIS C ANTIBODY: HCV AB: NEGATIVE

## 2016-03-13 MED ORDER — TINIDAZOLE 500 MG PO TABS: 500.0000 mg | ORAL_TABLET | Freq: Once | ORAL | 0 refills | Status: DC

## 2016-03-13 NOTE — Progress Notes (Signed)
Presents with complaints of mild breast tenderness that progressed to mild abdominal pain, with discharge/no odor, and no dyspareunia. Reports discharge have stopped, but cramps persist. Same partner, probable infidelity. Uses condoms occasionally/no other contraception. Hx of BV. LMP 02/18/16. Denies urinary symptoms, fever.  Exam: Appears well. External genitalia within normal limits. Speculum exam- scant blood at cervix.. Bimanual exam- mild tenderness reported with cervical motion. Wet prep positive for moderate clue cells. GC/Chlamydia, taken.   Bacterial Vaginosis STD screen Abdominal cramping  Plan: Tindamax 2 g daily for 2 days. Will call precautions reviewed.  GC/Chlamydia, HIV, Hep B/Hep C, RPR results pending. Encouraged to use condoms.  Advised to end unhealthy relationship with given doubts of cheating, STD suspicions, etc. Instructed to call if abdominal cramping persists, questions or concerns. Contraception options reviewed, declines will continue condoms.

## 2016-03-13 NOTE — Patient Instructions (Signed)

## 2016-03-14 LAB — RPR TITER: RPR Titer: 1:4 {titer}

## 2016-03-14 LAB — GC/CHLAMYDIA PROBE AMP
CT Probe RNA: NOT DETECTED
GC Probe RNA: NOT DETECTED

## 2016-03-14 LAB — RPR: RPR: REACTIVE — AB

## 2016-03-16 ENCOUNTER — Telehealth: Payer: Self-pay

## 2016-03-16 LAB — FLUORESCENT TREPONEMAL AB(FTA)-IGG-BLD: Fluorescent Treponemal ABS: REACTIVE — AB

## 2016-03-16 MED ORDER — TINIDAZOLE 500 MG PO TABS: ORAL_TABLET | ORAL | 0 refills | Status: DC

## 2016-03-16 NOTE — Telephone Encounter (Signed)
Pharmacy called to clarify dose on Tindamax. It was in [place] 's office note but had not been prescribed in the system so her chart would reflect it in her med list.

## 2016-03-17 ENCOUNTER — Other Ambulatory Visit: Payer: Self-pay | Admitting: Women's Health

## 2016-03-17 DIAGNOSIS — A539 Syphilis, unspecified: Secondary | ICD-10-CM

## 2016-03-17 MED ORDER — DOXYCYCLINE HYCLATE 100 MG PO CAPS: 100.0000 mg | ORAL_CAPSULE | Freq: Two times a day (BID) | ORAL | 0 refills | Status: DC

## 2016-03-17 MED ORDER — TINIDAZOLE 500 MG PO TABS: ORAL_TABLET | ORAL | 0 refills | Status: DC

## 2016-03-20 ENCOUNTER — Other Ambulatory Visit: Payer: Self-pay | Admitting: Women's Health

## 2016-03-20 ENCOUNTER — Encounter: Payer: Self-pay | Admitting: Women's Health

## 2016-03-20 ENCOUNTER — Telehealth: Payer: Self-pay

## 2016-03-20 DIAGNOSIS — A539 Syphilis, unspecified: Secondary | ICD-10-CM

## 2016-03-20 DIAGNOSIS — A530 Latent syphilis, unspecified as early or late: Secondary | ICD-10-CM | POA: Insufficient documentation

## 2016-03-20 MED ORDER — DOXYCYCLINE HYCLATE 100 MG PO CAPS: 100.0000 mg | ORAL_CAPSULE | Freq: Two times a day (BID) | ORAL | 0 refills | Status: DC

## 2016-03-20 NOTE — Telephone Encounter (Signed)
Brooke Howell with the [place] [facility] [employer] [facility] called. Please call her at [phone]. If she is not available you can speak with her Supervisor, Brooke Howell.  They received information regarding patient's reactive RPR result.  If patient has not had a negative RPR in the last 13 months and is not symptomatic then she will need to be treated with 28 consecutive days of Doxycycline or 3 rounds of 2.4 milliunits of Bicillin per CDC guidelines.  She asked you to call to discuss.

## 2016-03-20 NOTE — Telephone Encounter (Signed)
TC to [facility], confirmed doxy 100mg  bid for 28 days

## 2016-03-20 NOTE — Telephone Encounter (Signed)
[facility], will take doxycycline twice daily for full 28 days Rx call to pharmacy.

## 2016-03-20 NOTE — Telephone Encounter (Signed)
Harriett SineNancy, Did you call the lady with Dept of [facility] back as well?

## 2016-05-27 ENCOUNTER — Encounter: Payer: Self-pay | Admitting: Gynecology

## 2016-06-30 ENCOUNTER — Encounter: Payer: Self-pay | Admitting: Women's Health

## 2016-06-30 ENCOUNTER — Ambulatory Visit (INDEPENDENT_AMBULATORY_CARE_PROVIDER_SITE_OTHER): Payer: BLUE CROSS/BLUE SHIELD | Admitting: Women's Health

## 2016-06-30 VITALS — BP 118/70

## 2016-06-30 DIAGNOSIS — A539 Syphilis, unspecified: Secondary | ICD-10-CM | POA: Diagnosis not present

## 2016-06-30 DIAGNOSIS — N898 Other specified noninflammatory disorders of vagina: Secondary | ICD-10-CM | POA: Diagnosis not present

## 2016-06-30 DIAGNOSIS — R1031 Right lower quadrant pain: Secondary | ICD-10-CM

## 2016-06-30 DIAGNOSIS — R102 Pelvic and perineal pain: Secondary | ICD-10-CM

## 2016-06-30 LAB — WET PREP FOR TRICH, YEAST, CLUE
CLUE CELLS WET PREP: NONE SEEN
Trich, Wet Prep: NONE SEEN
WBC WET PREP: NONE SEEN
YEAST WET PREP: NONE SEEN

## 2016-06-30 NOTE — Progress Notes (Signed)
Presents with intermittent right  sided abdominal pain for the past week, had some discomfort on the left side, occasionally midline, minimal in the groin or pelvic area. Same partner with negative STD screen. 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 Was at primary care did a trial of penicillin, received 2 doses of penicillin without allergic reaction. Reports cycle 05/2014 for 5 days and then a light cycle June 10 for 3 days lighter than usual. Monthly cycle condoms or withdrawal. Has had problems of BV in the past. Denies discharge with odor or irritation, constipation, fever or urinary symptoms. Has occasional nausea after eating.  Exam: Appears well. UPT negative, abdomen soft without rebound, pain is more right sided lateral to umbilicus. External genitalia within normal limits, speculum exam scant white discharge without odor or erythema noted, wet prep negative. Bimanual no CMT or adnexal tenderness.  Right sided abdominal pain  Plan: Ultrasound instructed to schedule. Reassured exam and wet prep negative. Return to office in September for RPR. Instructed to keep menstrual calendar. Contraception options reviewed and declined will continue condoms.

## 2016-06-30 NOTE — Patient Instructions (Signed)

## 2016-07-13 ENCOUNTER — Ambulatory Visit (INDEPENDENT_AMBULATORY_CARE_PROVIDER_SITE_OTHER): Payer: BLUE CROSS/BLUE SHIELD | Admitting: Women's Health

## 2016-07-13 ENCOUNTER — Ambulatory Visit (INDEPENDENT_AMBULATORY_CARE_PROVIDER_SITE_OTHER): Payer: BLUE CROSS/BLUE SHIELD

## 2016-07-13 ENCOUNTER — Other Ambulatory Visit: Payer: Self-pay | Admitting: Women's Health

## 2016-07-13 DIAGNOSIS — R1031 Right lower quadrant pain: Secondary | ICD-10-CM

## 2016-07-13 DIAGNOSIS — R102 Pelvic and perineal pain: Secondary | ICD-10-CM | POA: Diagnosis not present

## 2016-07-13 DIAGNOSIS — N831 Corpus luteum cyst of ovary, unspecified side: Secondary | ICD-10-CM

## 2016-07-13 NOTE — Progress Notes (Signed)
Presents for ultrasound for complaint of intermittent right lower quadrant pain for the past 2 months. States it is usually worse prior to menstrual cycle.. Monthly cycle/condoms. Same partner, denies need for STD screening. Denies vaginal discharge, urinary symptoms, change in bowel or bladder elimination or fever. 03/2016 Positive RPR treated, does have follow-up scheduled in September.  Has a stressful family situation, numerous family members with drug addictions, 3 died from drug overdoses  in the last few years.   Exam: Appears well. Ultrasound: T/V anteverted uterus with thin endometrium homogeneous myometrium. Endometrium 3.3 mm. Right ovary thick-walled corpus luteal cyst 19 x 18 x 14 mm. Positive CFD periphery. Left ovary normal. Negative cul-de-sac.  Intermittent right lower  Normal GYN ultrasound  Plan: Reviewed normality of ultrasound, corpus luteal cysts, reviewed self limiting. Menstrual cycles/ovulation reviewed. Questions answered. Declines other contraception will continue condoms. Prenatal vitamin daily encouraged.

## 2016-07-14 ENCOUNTER — Telehealth: Payer: Self-pay | Admitting: *Deleted

## 2016-07-14 NOTE — Telephone Encounter (Signed)
Pt was seen yesterday due to right lower abdominal pain, had ultrasound showed right ovary cyst LMP:06/21/16 5 day cycle, pt said today when she went to bathroom and noticed spotting, asked if this normal with cyst? Pt said she is "freaking out about this cyst" I told pt could be related to cyst.  Please advise

## 2016-07-14 NOTE — Telephone Encounter (Signed)
Telephone call, reassured cyst was corpus luteal self-limiting functional will resolve on its own. Reviewed today's spotting today may have been more from the ultrasound itself. If spotting/bleeding continues instructed to call .

## 2016-09-30 ENCOUNTER — Other Ambulatory Visit: Payer: BLUE CROSS/BLUE SHIELD

## 2016-09-30 DIAGNOSIS — A539 Syphilis, unspecified: Secondary | ICD-10-CM

## 2016-10-01 LAB — RPR: RPR Ser Ql: REACTIVE — AB

## 2016-10-01 LAB — FLUORESCENT TREPONEMAL AB(FTA)-IGG-BLD: Fluorescent Treponemal ABS: REACTIVE — AB

## 2016-10-01 LAB — RPR TITER

## 2016-10-15 DIAGNOSIS — Z79899 Other long term (current) drug therapy: Secondary | ICD-10-CM | POA: Diagnosis not present

## 2016-10-15 DIAGNOSIS — N760 Acute vaginitis: Secondary | ICD-10-CM | POA: Diagnosis not present

## 2016-10-15 DIAGNOSIS — F430 Acute stress reaction: Secondary | ICD-10-CM | POA: Diagnosis not present

## 2016-11-12 DIAGNOSIS — Z79899 Other long term (current) drug therapy: Secondary | ICD-10-CM | POA: Diagnosis not present

## 2016-11-12 DIAGNOSIS — F430 Acute stress reaction: Secondary | ICD-10-CM | POA: Diagnosis not present

## 2016-12-10 DIAGNOSIS — F430 Acute stress reaction: Secondary | ICD-10-CM | POA: Diagnosis not present

## 2016-12-10 DIAGNOSIS — Z79899 Other long term (current) drug therapy: Secondary | ICD-10-CM | POA: Diagnosis not present

## 2017-01-25 DIAGNOSIS — N760 Acute vaginitis: Secondary | ICD-10-CM | POA: Diagnosis not present

## 2017-01-25 IMAGING — RF DG HYSTEROGRAM
5 series · 5 of 5 positions shown · IV contrast (omnipaque)
Comparison: None.

FLUOROSCOPY TIME:  Fluoroscopy Time:  48 seconds

Number of Acquired Images:  5

CLINICAL DATA: Female infertility

EXAM:
HYSTEROSALPINGOGRAM
TECHNIQUE: Following cleansing of the cervix and vagina with Betadine solution,
a hysterosalpingogram was performed using a 5-French
hysterosalpingogram catheter and Omnipaque 300 contrast. The patient
tolerated the examination without difficulty.

[Series 1: run · 1 of 1 slices shown (1 of 5)]
[im 1/1]
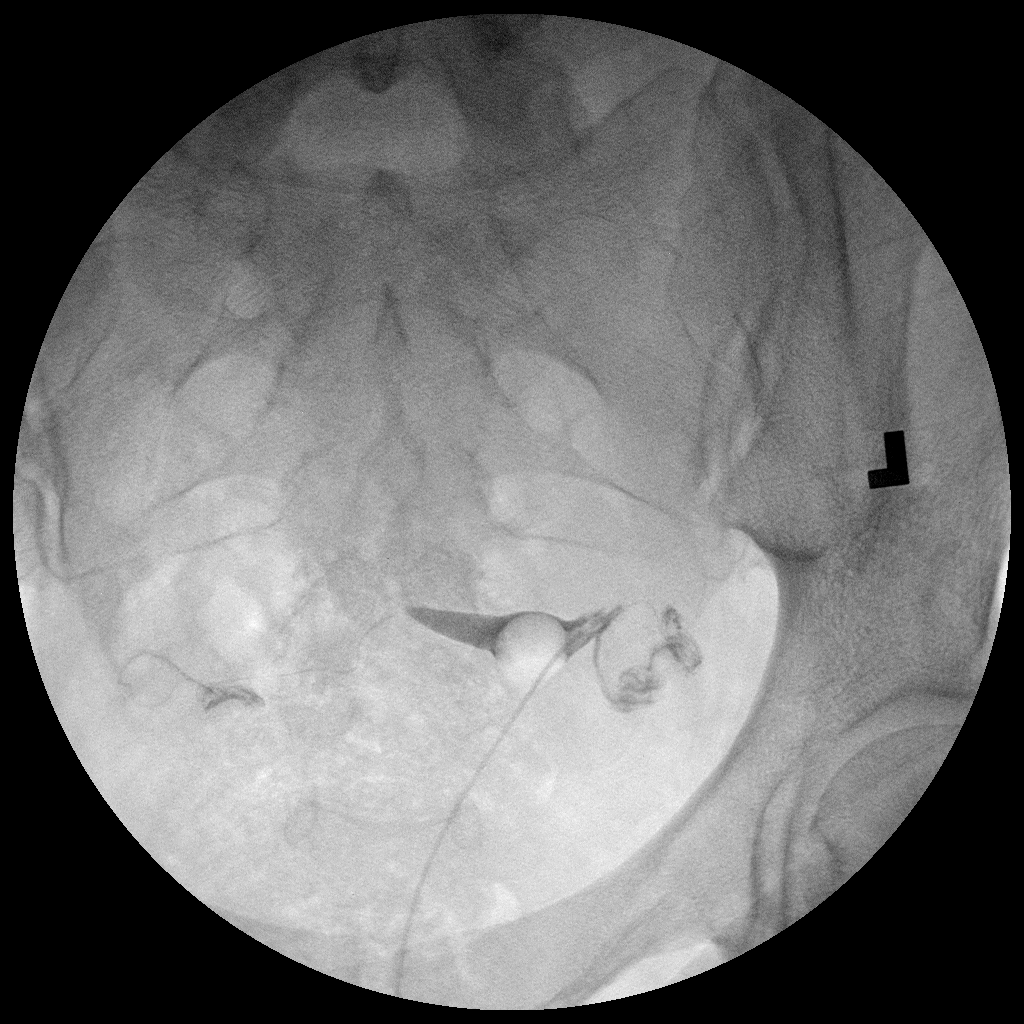

[Series 2: run · 1 of 1 slices shown (2 of 5)]
[im 1/1]
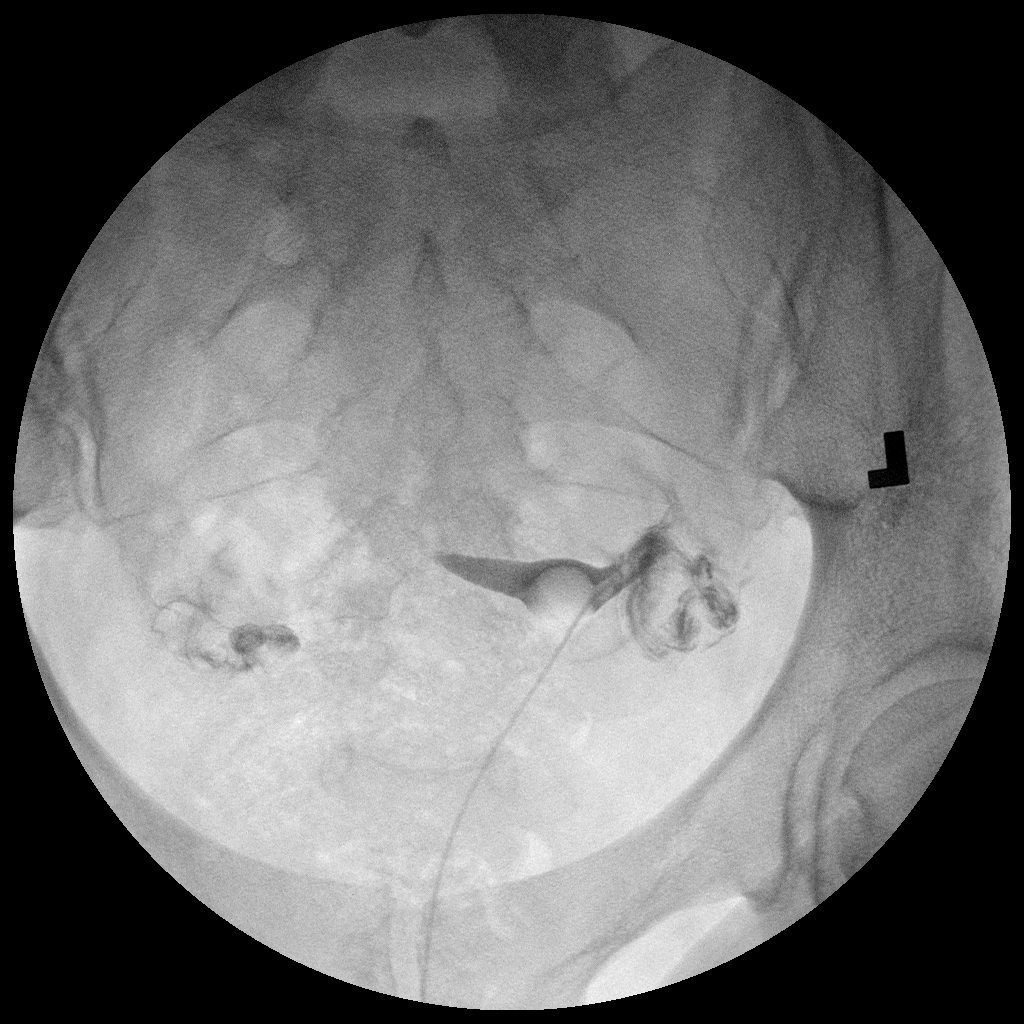

[Series 3: run · 1 of 1 slices shown (3 of 5)]
[im 1/1]
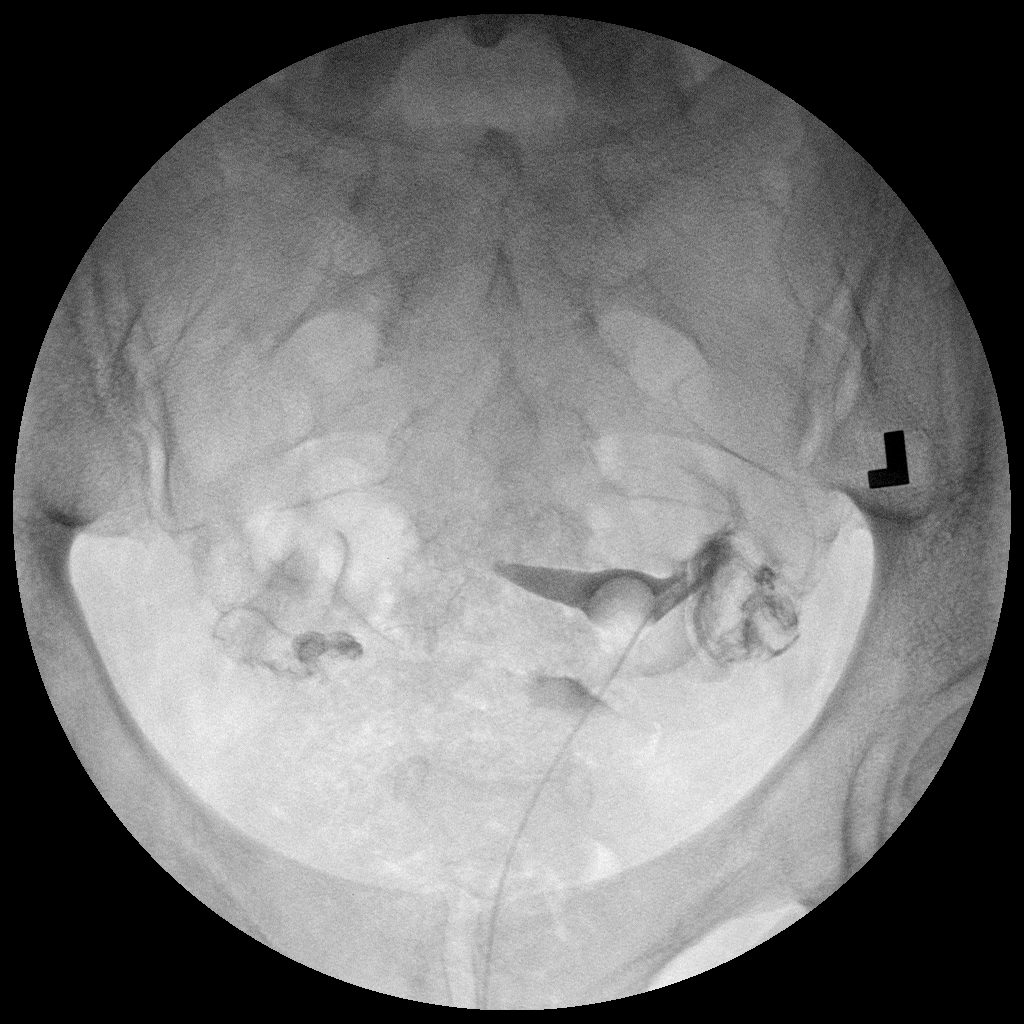

[Series 4: run · 1 of 1 slices shown (4 of 5)]
[im 1/1]
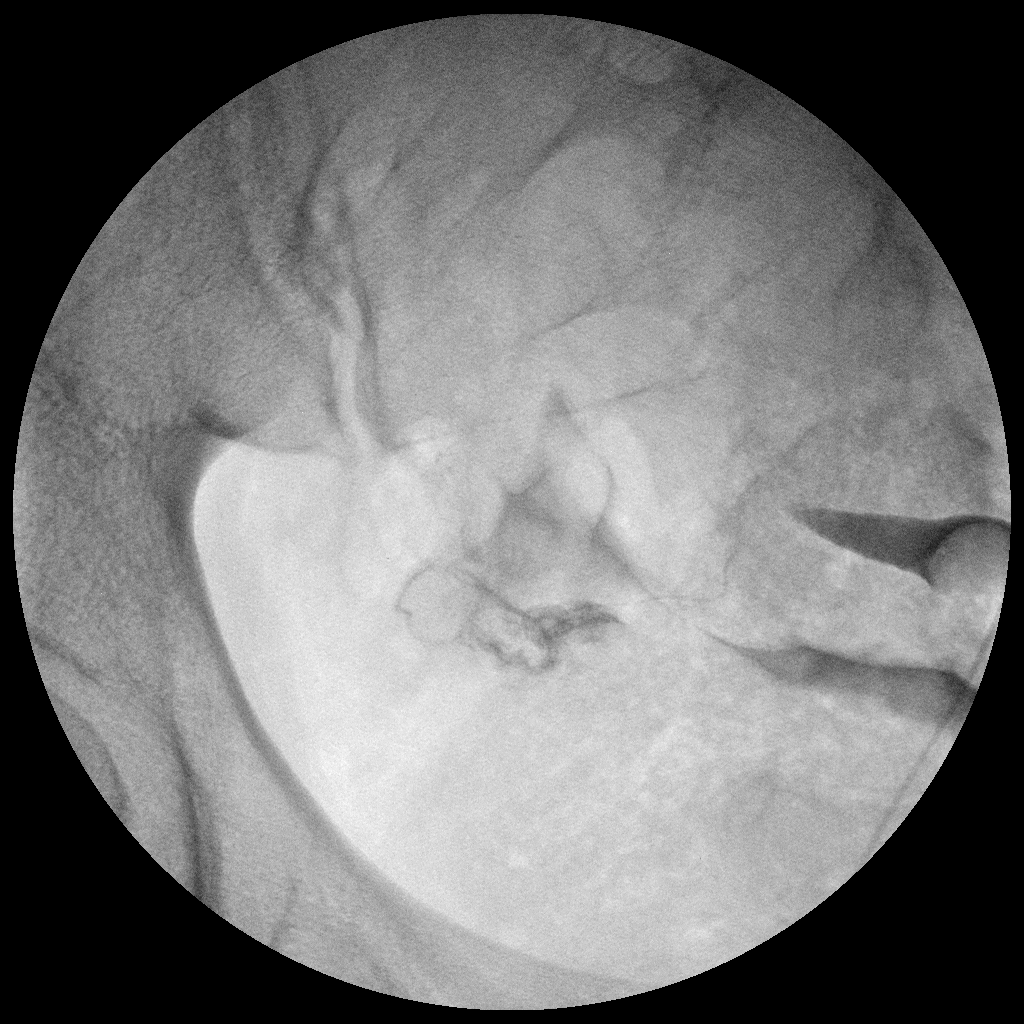

[Series 5: run · 1 of 1 slices shown (5 of 5)]
[im 1/1]
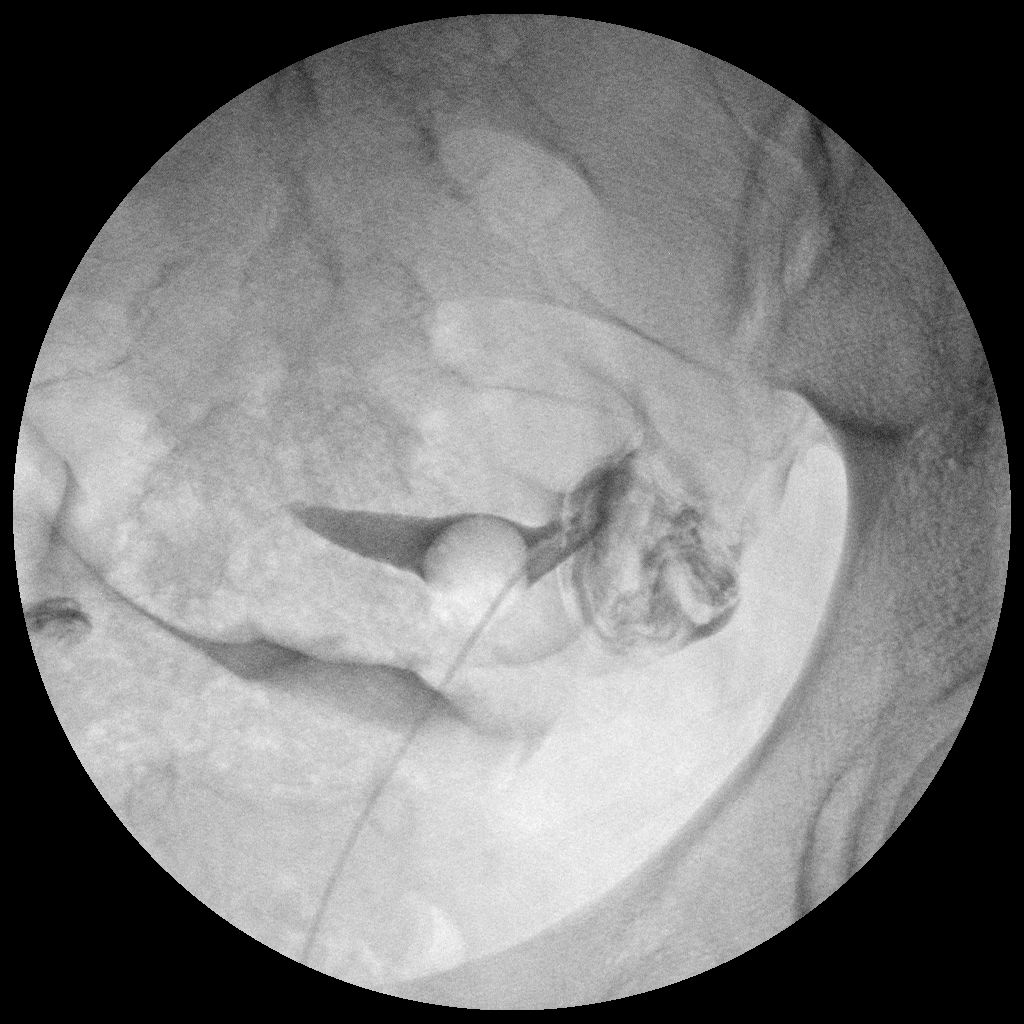

[5 of 5 positions shown; findings below may reference images not displayed]

FINDINGS: Normal early filling of the uterus.

Normal opacification of the bilateral fallopian tubes.

Satisfactory peritoneal spill bilaterally.
IMPRESSION: Normal HSG.

## 2017-05-19 ENCOUNTER — Other Ambulatory Visit: Payer: Self-pay | Admitting: Women's Health

## 2017-05-19 DIAGNOSIS — A539 Syphilis, unspecified: Secondary | ICD-10-CM

## 2018-05-12 DIAGNOSIS — H1012 Acute atopic conjunctivitis, left eye: Secondary | ICD-10-CM | POA: Diagnosis not present

## 2018-05-12 DIAGNOSIS — J309 Allergic rhinitis, unspecified: Secondary | ICD-10-CM | POA: Diagnosis not present

## 2018-06-13 ENCOUNTER — Other Ambulatory Visit: Payer: Self-pay

## 2018-06-14 ENCOUNTER — Ambulatory Visit: Payer: BC Managed Care – PPO | Admitting: Women's Health

## 2018-06-14 ENCOUNTER — Encounter: Payer: Self-pay | Admitting: Women's Health

## 2018-06-14 VITALS — BP 120/82 | Ht 68.0 in | Wt 214.0 lb

## 2018-06-14 DIAGNOSIS — B3731 Acute candidiasis of vulva and vagina: Secondary | ICD-10-CM

## 2018-06-14 DIAGNOSIS — Z113 Encounter for screening for infections with a predominantly sexual mode of transmission: Secondary | ICD-10-CM | POA: Diagnosis not present

## 2018-06-14 DIAGNOSIS — Z13220 Encounter for screening for lipoid disorders: Secondary | ICD-10-CM

## 2018-06-14 DIAGNOSIS — B373 Candidiasis of vulva and vagina: Secondary | ICD-10-CM

## 2018-06-14 DIAGNOSIS — Z01419 Encounter for gynecological examination (general) (routine) without abnormal findings: Secondary | ICD-10-CM

## 2018-06-14 LAB — WET PREP FOR TRICH, YEAST, CLUE

## 2018-06-14 MED ORDER — FLUCONAZOLE 150 MG PO TABS: 150.0000 mg | ORAL_TABLET | Freq: Once | ORAL | 1 refills | Status: AC

## 2018-06-14 MED ORDER — TINIDAZOLE 500 MG PO TABS: ORAL_TABLET | ORAL | 1 refills | Status: DC

## 2018-06-14 NOTE — Addendum Note (Signed)
Addended by: Tito Dine on: 06/14/2018 11:53 AM   Modules accepted: Orders

## 2018-06-14 NOTE — Patient Instructions (Signed)
Bacterial Vaginosis  Bacterial vaginosis is an infection of the vagina. It happens when too many normal germs (healthy bacteria) grow in the vagina. This infection puts you at risk for infections from sex (STIs). Treating this infection can lower your risk for some STIs. You should also treat this if you are pregnant. It can cause your baby to be born early. Follow these instructions at home: Medicines  Take over-the-counter and prescription medicines only as told by your doctor.  Take or use your antibiotic medicine as told by your doctor. Do not stop taking or using it even if you start to feel better. General instructions  If you your sexual partner is a woman, tell her that you have this infection. She needs to get treatment if she has symptoms. If you have a female partner, he does not need to be treated.  During treatment: ? Avoid sex. ? Do not douche. ? Avoid alcohol as told. ? Avoid breastfeeding as told.  Drink enough fluid to keep your pee (urine) clear or pale yellow.  Keep your vagina and butt (rectum) clean. ? Wash the area with warm water every day. ? Wipe from front to back after you use the toilet.  Keep all follow-up visits as told by your doctor. This is important. Preventing this condition  Do not douche.  Use only warm water to wash around your vagina.  Use protection when you have sex. This includes: ? Latex condoms. ? Dental dams.  Limit how many people you have sex with. It is best to only have sex with the same person (be monogamous).  Get tested for STIs. Have your partner get tested.  Wear underwear that is cotton or lined with cotton.  Avoid tight pants and pantyhose. This is most important in summer.  Do not use any products that have nicotine or tobacco in them. These include cigarettes and e-cigarettes. If you need help quitting, ask your doctor.  Do not use illegal drugs.  Limit how much alcohol you drink. Contact a doctor if:  Your  symptoms do not get better, even after you are treated.  You have more discharge or pain when you pee (urinate).  You have a fever.  You have pain in your belly (abdomen).  You have pain with sex.  Your bleed from your vagina between periods. Summary  This infection happens when too many germs (bacteria) grow in the vagina.  Treating this condition can lower your risk for some infections from sex (STIs).  You should also treat this if you are pregnant. It can cause early (premature) birth.  Do not stop taking or using your antibiotic medicine even if you start to feel better. This information is not intended to replace advice given to you by your health care provider. Make sure you discuss any questions you have with your health care provider. Document Released: 10/08/2007 Document Revised: 09/14/2015 Document Reviewed: 09/14/2015 Elsevier Interactive Patient Education  2019 [place] Maintenance, Female Adopting a healthy lifestyle and getting preventive care can go a long way to promote health and wellness. Talk with your health care provider about what schedule of regular examinations is right for you. This is a good chance for you to check in with your provider about disease prevention and staying healthy. In between checkups, there are plenty of things you can do on your own. Experts have done a lot of research about which lifestyle changes and preventive measures are most likely to keep you healthy. Ask  your health care provider for more information. Weight and diet Eat a healthy diet  Be sure to include plenty of vegetables, fruits, low-fat dairy products, and lean protein.  Do not eat a lot of foods high in solid fats, added sugars, or salt.  Get regular exercise. This is one of the most important things you can do for your health. ? Most adults should exercise for at least 150 minutes each week. The exercise should increase your heart rate and make you sweat  (moderate-intensity exercise). ? Most adults should also do strengthening exercises at least twice a week. This is in addition to the moderate-intensity exercise. Maintain a healthy weight  Body mass index (BMI) is a measurement that can be used to identify possible weight problems. It estimates body fat based on height and weight. Your health care provider can help determine your BMI and help you achieve or maintain a healthy weight.  For females 70 years of age and older: ? A BMI below 18.5 is considered underweight. ? A BMI of 18.5 to 24.9 is normal. ? A BMI of 25 to 29.9 is considered overweight. ? A BMI of 30 and above is considered obese. Watch levels of cholesterol and blood lipids  You should start having your blood tested for lipids and cholesterol at 30 years of age, then have this test every 5 years.  You may need to have your cholesterol levels checked more often if: ? Your lipid or cholesterol levels are high. ? You are older than 30 years of age. ? You are at high risk for heart disease. Cancer screening Lung Cancer  Lung cancer screening is recommended for adults 58-75 years old who are at high risk for lung cancer because of a history of smoking.  A yearly low-dose CT scan of the lungs is recommended for people who: ? Currently smoke. ? Have quit within the past 15 years. ? Have at least a 30-pack-year history of smoking. A pack year is smoking an average of one pack of cigarettes a day for 1 year.  Yearly screening should continue until it has been 15 years since you quit.  Yearly screening should stop if you develop a health problem that would prevent you from having lung cancer treatment. Breast Cancer  Practice breast self-awareness. This means understanding how your breasts normally appear and feel.  It also means doing regular breast self-exams. Let your health care provider know about any changes, no matter how small.  If you are in your 20s or 30s, you  should have a clinical breast exam (CBE) by a health care provider every 1-3 years as part of a regular health exam.  If you are 90 or older, have a CBE every year. Also consider having a breast X-ray (mammogram) every year.  If you have a family history of breast cancer, talk to your health care provider about genetic screening.  If you are at high risk for breast cancer, talk to your health care provider about having an MRI and a mammogram every year.  Breast cancer gene (BRCA) assessment is recommended for women who have family members with BRCA-related cancers. BRCA-related cancers include: ? Breast. ? Ovarian. ? Tubal. ? Peritoneal cancers.  Results of the assessment will determine the need for genetic counseling and BRCA1 and BRCA2 testing. Cervical Cancer Your health care provider may recommend that you be screened regularly for cancer of the pelvic organs (ovaries, uterus, and vagina). This screening involves a pelvic examination, including checking  for microscopic changes to the surface of your cervix (Pap test). You may be encouraged to have this screening done every 3 years, beginning at age 16.  For women ages 31-65, health care providers may recommend pelvic exams and Pap testing every 3 years, or they may recommend the Pap and pelvic exam, combined with testing for human papilloma virus (HPV), every 5 years. Some types of HPV increase your risk of cervical cancer. Testing for HPV may also be done on women of any age with unclear Pap test results.  Other health care providers may not recommend any screening for nonpregnant women who are considered low risk for pelvic cancer and who do not have symptoms. Ask your health care provider if a screening pelvic exam is right for you.  If you have had past treatment for cervical cancer or a condition that could lead to cancer, you need Pap tests and screening for cancer for at least 20 years after your treatment. If Pap tests have been  discontinued, your risk factors (such as having a new sexual partner) need to be reassessed to determine if screening should resume. Some women have medical problems that increase the chance of getting cervical cancer. In these cases, your health care provider may recommend more frequent screening and Pap tests. Colorectal Cancer  This type of cancer can be detected and often prevented.  Routine colorectal cancer screening usually begins at 30 years of age and continues through 30 years of age.  Your health care provider may recommend screening at an earlier age if you have risk factors for colon cancer.  Your health care provider may also recommend using home test kits to check for hidden blood in the stool.  A small camera at the end of a tube can be used to examine your colon directly (sigmoidoscopy or colonoscopy). This is done to check for the earliest forms of colorectal cancer.  Routine screening usually begins at age 73.  Direct examination of the colon should be repeated every 5-10 years through 30 years of age. However, you may need to be screened more often if early forms of precancerous polyps or small growths are found. Skin Cancer  Check your skin from head to toe regularly.  Tell your health care provider about any new moles or changes in moles, especially if there is a change in a mole's shape or color.  Also tell your health care provider if you have a mole that is larger than the size of a pencil eraser.  Always use sunscreen. Apply sunscreen liberally and repeatedly throughout the day.  Protect yourself by wearing long sleeves, pants, a wide-brimmed hat, and sunglasses whenever you are outside. Heart disease, diabetes, and high blood pressure  High blood pressure causes heart disease and increases the risk of stroke. High blood pressure is more likely to develop in: ? People who have blood pressure in the high end of the normal range (130-139/85-89 mm Hg). ? People  who are overweight or obese. ? People who are African American.  If you are 35-20 years of age, have your blood pressure checked every 3-5 years. If you are 53 years of age or older, have your blood pressure checked every year. You should have your blood pressure measured twice-once when you are at a hospital or clinic, and once when you are not at a hospital or clinic. Record the average of the two measurements. To check your blood pressure when you are not at a hospital or clinic,  you can use: ? An automated blood pressure machine at a pharmacy. ? A home blood pressure monitor.  If you are between 49 years and 26 years old, ask your health care provider if you should take aspirin to prevent strokes.  Have regular diabetes screenings. This involves taking a blood sample to check your fasting blood sugar level. ? If you are at a normal weight and have a low risk for diabetes, have this test once every three years after 30 years of age. ? If you are overweight and have a high risk for diabetes, consider being tested at a younger age or more often. Preventing infection Hepatitis B  If you have a higher risk for hepatitis B, you should be screened for this virus. You are considered at high risk for hepatitis B if: ? You were born in a country where hepatitis B is common. Ask your health care provider which countries are considered high risk. ? Your parents were born in a high-risk country, and you have not been immunized against hepatitis B (hepatitis B vaccine). ? You have HIV or AIDS. ? You use needles to inject street drugs. ? You live with someone who has hepatitis B. ? You have had sex with someone who has hepatitis B. ? You get hemodialysis treatment. ? You take certain medicines for conditions, including cancer, organ transplantation, and autoimmune conditions. Hepatitis C  Blood testing is recommended for: ? Everyone born from [birth year] through 1967. ? Anyone with known risk factors for  hepatitis C. Sexually transmitted infections (STIs)  You should be screened for sexually transmitted infections (STIs) including gonorrhea and chlamydia if: ? You are sexually active and are younger than 30 years of age. ? You are older than 30 years of age and your health care provider tells you that you are at risk for this type of infection. ? Your sexual activity has changed since you were last screened and you are at an increased risk for chlamydia or gonorrhea. Ask your health care provider if you are at risk.  If you do not have HIV, but are at risk, it may be recommended that you take a prescription medicine daily to prevent HIV infection. This is called pre-exposure prophylaxis (PrEP). You are considered at risk if: ? You are sexually active and do not regularly use condoms or know the HIV status of your partner(s). ? You take drugs by injection. ? You are sexually active with a partner who has HIV. Talk with your health care provider about whether you are at high risk of being infected with HIV. If you choose to begin PrEP, you should first be tested for HIV. You should then be tested every 3 months for as long as you are taking PrEP. Pregnancy  If you are premenopausal and you may become pregnant, ask your health care provider about preconception counseling.  If you may become pregnant, take 400 to 800 micrograms (mcg) of folic acid every day.  If you want to prevent pregnancy, talk to your health care provider about birth control (contraception). Osteoporosis and menopause  Osteoporosis is a disease in which the bones lose minerals and strength with aging. This can result in serious bone fractures. Your risk for osteoporosis can be identified using a bone density scan.  If you are 38 years of age or older, or if you are at risk for osteoporosis and fractures, ask your health care provider if you should be screened.  Ask your health care provider whether  you should take a calcium  or vitamin D supplement to lower your risk for osteoporosis.  Menopause may have certain physical symptoms and risks.  Hormone replacement therapy may reduce some of these symptoms and risks. Talk to your health care provider about whether hormone replacement therapy is right for you. Follow these instructions at home:  Schedule regular health, dental, and eye exams.  Stay current with your immunizations.  Do not use any tobacco products including cigarettes, chewing tobacco, or electronic cigarettes.  If you are pregnant, do not drink alcohol.  If you are breastfeeding, limit how much and how often you drink alcohol.  Limit alcohol intake to no more than 1 drink per day for nonpregnant women. One drink equals 12 ounces of beer, 5 ounces of wine, or 1 ounces of hard liquor.  Do not use street drugs.  Do not share needles.  Ask your health care provider for help if you need support or information about quitting drugs.  Tell your health care provider if you often feel depressed.  Tell your health care provider if you have ever been abused or do not feel safe at home. This information is not intended to replace advice given to you by your health care provider. Make sure you discuss any questions you have with your health care provider. Document Released: 07/14/2010 Document Revised: 06/06/2015 Document Reviewed: 10/02/2014 Elsevier Interactive Patient Education  2019 [employer].

## 2018-06-14 NOTE — Progress Notes (Signed)
Brooke Howell 10-31-1990 [id number]    History:    Presents for annual exam.  Monthly cycle/condoms.  History of  syphilis treated 2018.  Negative breast biopsy 2017.  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  History of recurrent BV.  Past medical history, past surgical history, family history and social history were all reviewed and documented in the EPIC chart.  CNA.  ROS:  A ROS was performed and pertinent positives and negatives are included.  Exam:  Vitals:   06/14/18 1053  BP: 120/82  Weight: 214 lb (97.1 kg)  Height: 5\' 8"  (1.727 m)   Body mass index is 32.54 kg/m.   General appearance:  Normal Thyroid:  Symmetrical, normal in size, without palpable masses or nodularity. Respiratory  Auscultation:  Clear without wheezing or rhonchi Cardiovascular  Auscultation:  Regular rate, without rubs, murmurs or gallops  Edema/varicosities:  Not grossly evident Abdominal  Soft,nontender, without masses, guarding or rebound.  Liver/spleen:  No organomegaly noted  Hernia:  None appreciated  Skin  Inspection:  Grossly normal   Breasts: Examined lying and sitting.     Right: Without masses, retractions, discharge or axillary adenopathy.     Left: Without masses, retractions, discharge or axillary adenopathy. Gentitourinary   Inguinal/mons:  Normal without inguinal adenopathy  External genitalia:  Normal  BUS/Urethra/Skene's glands:  Normal  Vagina: Moderate white discharge wet prep positive for clues, TNTC bacteria  Cervix:  Normal  Uterus: normal in size, shape and contour.  Midline and mobile  Adnexa/parametria:     Rt: Without masses or tenderness.   Lt: Without masses or tenderness.  Anus and perineum: Normal  Digital rectal exam: Normal sphincter tone without palpated masses or tenderness  Assessment/Plan:  28 y.o. SBF G0 for annual exam requesting STD screen unfaithful partner no symptoms.  Monthly cycle/condoms STD screen Bacterial  vaginosis Obesity 2018 syphilis treated 2012 ASCUS positive high risk HPV normal after  Plan: Contraception options reviewed, declines will continue condoms/abstinence.  Tindamax 2 g p.o. x1 dose prescription, proper use given instructed to avoid alcohol.  Instructed to call if continued discharge, Diflucan 150  mg 1 dose given for vaginal itching.  SBEs, calcium rich foods, MVI daily, reviewed importance of increasing exercise and decreasing calorie/carbs encouraged.  CBC, CMP, lipid panel, GC/chlamydia, HIV, RPR,    Harrington Challenger [facility], 11:45 AM 06/14/2018

## 2018-06-15 LAB — CBC WITH DIFFERENTIAL/PLATELET
Absolute Monocytes: 333 cells/uL (ref 200–950)
Basophils Absolute: 32 cells/uL (ref 0–200)
Basophils Relative: 0.7 %
Eosinophils Absolute: 131 cells/uL (ref 15–500)
Eosinophils Relative: 2.9 %
HCT: 38.2 % (ref 35.0–45.0)
Hemoglobin: 12.5 g/dL (ref 11.7–15.5)
Lymphs Abs: 2057 cells/uL (ref 850–3900)
MCH: 30.3 pg (ref 27.0–33.0)
MCHC: 32.7 g/dL (ref 32.0–36.0)
MCV: 92.7 fL (ref 80.0–100.0)
MPV: 9.9 fL (ref 7.5–12.5)
Monocytes Relative: 7.4 %
Neutro Abs: 1949 cells/uL (ref 1500–7800)
Neutrophils Relative %: 43.3 %
Platelets: 286 10*3/uL (ref 140–400)
RBC: 4.12 10*6/uL (ref 3.80–5.10)
RDW: 12.5 % (ref 11.0–15.0)
Total Lymphocyte: 45.7 %
WBC: 4.5 10*3/uL (ref 3.8–10.8)

## 2018-06-15 LAB — LIPID PANEL
Cholesterol: 211 mg/dL — ABNORMAL HIGH (ref <200)
HDL: 80 mg/dL (ref >=50)
LDL Cholesterol (Calc): 115 mg/dL (calc) — ABNORMAL HIGH
Non-HDL Cholesterol (Calc): 131 mg/dL (calc) — ABNORMAL HIGH (ref <130)
Total CHOL/HDL Ratio: 2.6 (calc) (ref <5.0)
Triglycerides: 67 mg/dL (ref <150)

## 2018-06-15 LAB — RPR TITER: RPR Titer: 1:4 {titer} — ABNORMAL HIGH

## 2018-06-15 LAB — COMPREHENSIVE METABOLIC PANEL
AG Ratio: 1.4 (calc) (ref 1.0–2.5)
ALT: 12 U/L (ref 6–29)
AST: 15 U/L (ref 10–30)
Albumin: 4.1 g/dL (ref 3.6–5.1)
Alkaline phosphatase (APISO): 45 U/L (ref 31–125)
BUN/Creatinine Ratio: 12 (calc) (ref 6–22)
BUN: 14 mg/dL (ref 7–25)
CO2: 24 mmol/L (ref 20–32)
Calcium: 9.4 mg/dL (ref 8.6–10.2)
Chloride: 106 mmol/L (ref 98–110)
Creat: 1.13 mg/dL — ABNORMAL HIGH (ref 0.50–1.10)
Globulin: 2.9 g/dL (calc) (ref 1.9–3.7)
Glucose, Bld: 85 mg/dL (ref 65–99)
Potassium: 4.3 mmol/L (ref 3.5–5.3)
Sodium: 139 mmol/L (ref 135–146)
Total Bilirubin: 0.4 mg/dL (ref 0.2–1.2)
Total Protein: 7 g/dL (ref 6.1–8.1)

## 2018-06-15 LAB — HIV ANTIBODY (ROUTINE TESTING W REFLEX): HIV 1&2 Ab, 4th Generation: NONREACTIVE

## 2018-06-15 LAB — RPR: RPR Ser Ql: REACTIVE — AB

## 2018-06-15 LAB — FLUORESCENT TREPONEMAL AB(FTA)-IGG-BLD: Fluorescent Treponemal ABS: REACTIVE — AB

## 2018-06-16 LAB — C. TRACHOMATIS/N. GONORRHOEAE RNA
C. trachomatis RNA, TMA: NOT DETECTED
N. gonorrhoeae RNA, TMA: NOT DETECTED

## 2018-06-16 LAB — PAP, TP IMAGING W/ HPV RNA, RFLX HPV TYPE 16,18/45: HPV DNA High Risk: NOT DETECTED

## 2018-07-22 ENCOUNTER — Other Ambulatory Visit: Payer: Self-pay

## 2018-07-25 ENCOUNTER — Other Ambulatory Visit: Payer: Self-pay

## 2018-07-25 ENCOUNTER — Ambulatory Visit: Payer: BC Managed Care – PPO | Admitting: Women's Health

## 2018-07-25 ENCOUNTER — Encounter: Payer: Self-pay | Admitting: Women's Health

## 2018-07-25 VITALS — BP 118/80

## 2018-07-25 DIAGNOSIS — Z3201 Encounter for pregnancy test, result positive: Secondary | ICD-10-CM | POA: Diagnosis not present

## 2018-07-25 DIAGNOSIS — N912 Amenorrhea, unspecified: Secondary | ICD-10-CM

## 2018-07-25 DIAGNOSIS — O3680X0 Pregnancy with inconclusive fetal viability, not applicable or unspecified: Secondary | ICD-10-CM

## 2018-07-25 LAB — PREGNANCY, URINE: Preg Test, Ur: POSITIVE — AB

## 2018-07-25 NOTE — Patient Instructions (Signed)
First Trimester of Pregnancy The first trimester of pregnancy is from week 1 until the end of week 13 (months 1 through 3). A week after a sperm fertilizes an egg, the egg will implant on the wall of the uterus. This embryo will begin to develop into a baby. Genes from you and your partner will form the baby. The female genes will determine whether the baby will be a boy or a girl. At 6-8 weeks, the eyes and face will be formed, and the heartbeat can be seen on ultrasound. At the end of 12 weeks, all the baby's organs will be formed. Now that you are pregnant, you will want to do everything you can to have a healthy baby. Two of the most important things are to get good prenatal care and to follow your health care provider's instructions. Prenatal care is all the medical care you receive before the baby's birth. This care will help prevent, find, and treat any problems during the pregnancy and childbirth. Body changes during your first trimester Your body goes through many changes during pregnancy. The changes vary from woman to woman.  You may gain or lose a couple of pounds at first.  You may feel sick to your stomach (nauseous) and you may throw up (vomit). If the vomiting is uncontrollable, call your health care provider.  You may tire easily.  You may develop headaches that can be relieved by medicines. All medicines should be approved by your health care provider.  You may urinate more often. Painful urination may mean you have a bladder infection.  You may develop heartburn as a result of your pregnancy.  You may develop constipation because certain hormones are causing the muscles that push stool through your intestines to slow down.  You may develop hemorrhoids or swollen veins (varicose veins).  Your breasts may begin to grow larger and become tender. Your nipples may stick out more, and the tissue that surrounds them (areola) may become darker.  Your gums may bleed and may be  sensitive to brushing and flossing.  Dark spots or blotches (chloasma, mask of pregnancy) may develop on your face. This will likely fade after the baby is born.  Your menstrual periods will stop.  You may have a loss of appetite.  You may develop cravings for certain kinds of food.  You may have changes in your emotions from day to day, such as being excited to be pregnant or being concerned that something may go wrong with the pregnancy and baby.  You may have more vivid and strange dreams.  You may have changes in your hair. These can include thickening of your hair, rapid growth, and changes in texture. Some women also have hair loss during or after pregnancy, or hair that feels dry or thin. Your hair will most likely return to normal after your baby is born. What to expect at prenatal visits During a routine prenatal visit:  You will be weighed to make sure you and the baby are growing normally.  Your blood pressure will be taken.  Your abdomen will be measured to track your baby's growth.  The fetal heartbeat will be listened to between weeks 10 and 14 of your pregnancy.  Test results from any previous visits will be discussed. Your health care provider may ask you:  How you are feeling.  If you are feeling the baby move.  If you have had any abnormal symptoms, such as leaking fluid, bleeding, severe headaches, or abdominal   cramping.  If you are using any tobacco products, including cigarettes, chewing tobacco, and electronic cigarettes.  If you have any questions. Other tests that may be performed during your first trimester include:  Blood tests to find your blood type and to check for the presence of any previous infections. The tests will also be used to check for low iron levels (anemia) and protein on red blood cells (Rh antibodies). Depending on your risk factors, or if you previously had diabetes during pregnancy, you may have tests to check for high blood sugar  that affects pregnant women (gestational diabetes).  Urine tests to check for infections, diabetes, or protein in the urine.  An ultrasound to confirm the proper growth and development of the baby.  Fetal screens for spinal cord problems (spina bifida) and Down syndrome.  HIV (human immunodeficiency virus) testing. Routine prenatal testing includes screening for HIV, unless you choose not to have this test.  You may need other tests to make sure you and the baby are doing well. Follow these instructions at home: Medicines  Follow your health care provider's instructions regarding medicine use. Specific medicines may be either safe or unsafe to take during pregnancy.  Take a prenatal vitamin that contains at least 600 micrograms (mcg) of folic acid.  If you develop constipation, try taking a stool softener if your health care provider approves. Eating and drinking   Eat a balanced diet that includes fresh fruits and vegetables, whole grains, good sources of protein such as meat, eggs, or tofu, and low-fat dairy. Your health care provider will help you determine the amount of weight gain that is right for you.  Avoid raw meat and uncooked cheese. These carry germs that can cause birth defects in the baby.  Eating four or five small meals rather than three large meals a day may help relieve nausea and vomiting. If you start to feel nauseous, eating a few soda crackers can be helpful. Drinking liquids between meals, instead of during meals, also seems to help ease nausea and vomiting.  Limit foods that are high in fat and processed sugars, such as fried and sweet foods.  To prevent constipation: ? Eat foods that are high in fiber, such as fresh fruits and vegetables, whole grains, and beans. ? Drink enough fluid to keep your urine clear or pale yellow. Activity  Exercise only as directed by your health care provider. Most women can continue their usual exercise routine during  pregnancy. Try to exercise for 30 minutes at least 5 days a week. Exercising will help you: ? Control your weight. ? Stay in shape. ? Be prepared for labor and delivery.  Experiencing pain or cramping in the lower abdomen or lower back is a good sign that you should stop exercising. Check with your health care provider before continuing with normal exercises.  Try to avoid standing for long periods of time. Move your legs often if you must stand in one place for a long time.  Avoid heavy lifting.  Wear low-heeled shoes and practice good posture.  You may continue to have sex unless your health care provider tells you not to. Relieving pain and discomfort  Wear a good support bra to relieve breast tenderness.  Take warm sitz baths to soothe any pain or discomfort caused by hemorrhoids. Use hemorrhoid cream if your health care provider approves.  Rest with your legs elevated if you have leg cramps or low back pain.  If you develop varicose veins in   your legs, wear support hose. Elevate your feet for 15 minutes, 3-4 times a day. Limit salt in your diet. Prenatal care  Schedule your prenatal visits by the twelfth week of pregnancy. They are usually scheduled monthly at first, then more often in the last 2 months before delivery.  Write down your questions. Take them to your prenatal visits.  Keep all your prenatal visits as told by your health care provider. This is important. Safety  Wear your seat belt at all times when driving.  Make a list of emergency phone numbers, including numbers for family, friends, the hospital, and police and fire departments. General instructions  Ask your health care provider for a referral to a local prenatal education class. Begin classes no later than the beginning of month 6 of your pregnancy.  Ask for help if you have counseling or nutritional needs during pregnancy. Your health care provider can offer advice or refer you to specialists for help  with various needs.  Do not use hot tubs, steam rooms, or saunas.  Do not douche or use tampons or scented sanitary pads.  Do not cross your legs for long periods of time.  Avoid cat litter boxes and soil used by cats. These carry germs that can cause birth defects in the baby and possibly loss of the fetus by miscarriage or stillbirth.  Avoid all smoking, herbs, alcohol, and medicines not prescribed by your health care provider. Chemicals in these products affect the formation and growth of the baby.  Do not use any products that contain nicotine or tobacco, such as cigarettes and e-cigarettes. If you need help quitting, ask your health care provider. You may receive counseling support and other resources to help you quit.  Schedule a dentist appointment. At home, brush your teeth with a soft toothbrush and be gentle when you floss. Contact a health care provider if:  You have dizziness.  You have mild pelvic cramps, pelvic pressure, or nagging pain in the abdominal area.  You have persistent nausea, vomiting, or diarrhea.  You have a bad smelling vaginal discharge.  You have pain when you urinate.  You notice increased swelling in your face, hands, legs, or ankles.  You are exposed to fifth disease or chickenpox.  You are exposed to German measles (rubella) and have never had it. Get help right away if:  You have a fever.  You are leaking fluid from your vagina.  You have spotting or bleeding from your vagina.  You have severe abdominal cramping or pain.  You have rapid weight gain or loss.  You vomit blood or material that looks like coffee grounds.  You develop a severe headache.  You have shortness of breath.  You have any kind of trauma, such as from a fall or a car accident. Summary  The first trimester of pregnancy is from week 1 until the end of week 13 (months 1 through 3).  Your body goes through many changes during pregnancy. The changes vary from  woman to woman.  You will have routine prenatal visits. During those visits, your health care provider will examine you, discuss any test results you may have, and talk with you about how you are feeling. This information is not intended to replace advice given to you by your health care provider. Make sure you discuss any questions you have with your health care provider. Document Released: 12/23/2000 Document Revised: 12/11/2016 Document Reviewed: 12/11/2015 Elsevier Patient Education  2020 [employer]  

## 2018-07-25 NOTE — Progress Notes (Signed)
28 year old SBF G1 P0 presents with positive home UPT.  LMP 06/24/2018 normal 4-day cycle.  States had scant spotting July 4, probable implantation.  History of regular monthly cycle.  Pleased with pregnancy.  Same partner.  Denies vaginal spotting/bleeding, urinary symptoms, vaginal discharge, abdominal pain or fever.  States has had mild lower abdominal cramping intermittent.  2018 treated for positive syphilis has low titers.  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  Has had negative COVID testing.  Exam: Appears well.  Positive UPT  Early pregnancy 4 weeks 3 days by dates  Plan: Reviewed importance of daily prenatal vitamin, safe pregnancy behaviors reviewed, has quit smoking, congratulated.  Instructed to call or return if any further bleeding or spotting.  Schedule viability/dating ultrasound first week of August.  Aware we no longer deliver, congratulations given.

## 2018-08-08 ENCOUNTER — Telehealth: Payer: Self-pay

## 2018-08-08 NOTE — Telephone Encounter (Signed)
Spoke with patient and informed her. °

## 2018-08-08 NOTE — Telephone Encounter (Signed)
I would have her start to make arrangements for new OB appointment as there will be some lag time before she can be seen.  I am not sure that a primary physician is going to add much at this point.

## 2018-08-08 NOTE — Telephone Encounter (Signed)
Not really.  Would recommend establishing care with an obstetrical group and following up with them

## 2018-08-08 NOTE — Telephone Encounter (Signed)
NY's pt. TF is back up MD.  Patient is early pregnant. Recently, since pregnant, she said both her arms are sore and stiff from the should to the elbow when she wakes. She said they hurt so bad sometimes that it wakes her up and she has to just get on up.    Has never had this before. Any ideas?

## 2018-08-08 NOTE — Telephone Encounter (Signed)
She said [place] told her to wait until she saw her on 8/5 before she schedules with OB. Should I recommend she see PCP about it?

## 2018-08-16 ENCOUNTER — Other Ambulatory Visit: Payer: Self-pay

## 2018-08-17 ENCOUNTER — Ambulatory Visit: Payer: BC Managed Care – PPO

## 2018-08-17 ENCOUNTER — Encounter: Payer: Self-pay | Admitting: Women's Health

## 2018-08-17 ENCOUNTER — Ambulatory Visit (INDEPENDENT_AMBULATORY_CARE_PROVIDER_SITE_OTHER): Payer: BC Managed Care – PPO | Admitting: Women's Health

## 2018-08-17 VITALS — BP 124/80

## 2018-08-17 DIAGNOSIS — Z3A01 Less than 8 weeks gestation of pregnancy: Secondary | ICD-10-CM

## 2018-08-17 DIAGNOSIS — N898 Other specified noninflammatory disorders of vagina: Secondary | ICD-10-CM

## 2018-08-17 DIAGNOSIS — O34539 Maternal care for retroversion of gravid uterus, unspecified trimester: Secondary | ICD-10-CM

## 2018-08-17 DIAGNOSIS — O3481 Maternal care for other abnormalities of pelvic organs, first trimester: Secondary | ICD-10-CM

## 2018-08-17 DIAGNOSIS — O3680X0 Pregnancy with inconclusive fetal viability, not applicable or unspecified: Secondary | ICD-10-CM

## 2018-08-17 LAB — WET PREP FOR TRICH, YEAST, CLUE

## 2018-08-17 MED ORDER — METRONIDAZOLE 500 MG PO TABS: 500.0000 mg | ORAL_TABLET | Freq: Two times a day (BID) | ORAL | 0 refills | Status: DC

## 2018-08-17 NOTE — Patient Instructions (Signed)
First Trimester of Pregnancy The first trimester of pregnancy is from week 1 until the end of week 13 (months 1 through 3). A week after a sperm fertilizes an egg, the egg will implant on the wall of the uterus. This embryo will begin to develop into a baby. Genes from you and your partner will form the baby. The female genes will determine whether the baby will be a boy or a girl. At 6-8 weeks, the eyes and face will be formed, and the heartbeat can be seen on ultrasound. At the end of 12 weeks, all the baby's organs will be formed. Now that you are pregnant, you will want to do everything you can to have a healthy baby. Two of the most important things are to get good prenatal care and to follow your health care provider's instructions. Prenatal care is all the medical care you receive before the baby's birth. This care will help prevent, find, and treat any problems during the pregnancy and childbirth. Body changes during your first trimester Your body goes through many changes during pregnancy. The changes vary from woman to woman.  You may gain or lose a couple of pounds at first.  You may feel sick to your stomach (nauseous) and you may throw up (vomit). If the vomiting is uncontrollable, call your health care provider.  You may tire easily.  You may develop headaches that can be relieved by medicines. All medicines should be approved by your health care provider.  You may urinate more often. Painful urination may mean you have a bladder infection.  You may develop heartburn as a result of your pregnancy.  You may develop constipation because certain hormones are causing the muscles that push stool through your intestines to slow down.  You may develop hemorrhoids or swollen veins (varicose veins).  Your breasts may begin to grow larger and become tender. Your nipples may stick out more, and the tissue that surrounds them (areola) may become darker.  Your gums may bleed and may be  sensitive to brushing and flossing.  Dark spots or blotches (chloasma, mask of pregnancy) may develop on your face. This will likely fade after the baby is born.  Your menstrual periods will stop.  You may have a loss of appetite.  You may develop cravings for certain kinds of food.  You may have changes in your emotions from day to day, such as being excited to be pregnant or being concerned that something may go wrong with the pregnancy and baby.  You may have more vivid and strange dreams.  You may have changes in your hair. These can include thickening of your hair, rapid growth, and changes in texture. Some women also have hair loss during or after pregnancy, or hair that feels dry or thin. Your hair will most likely return to normal after your baby is born. What to expect at prenatal visits During a routine prenatal visit:  You will be weighed to make sure you and the baby are growing normally.  Your blood pressure will be taken.  Your abdomen will be measured to track your baby's growth.  The fetal heartbeat will be listened to between weeks 10 and 14 of your pregnancy.  Test results from any previous visits will be discussed. Your health care provider may ask you:  How you are feeling.  If you are feeling the baby move.  If you have had any abnormal symptoms, such as leaking fluid, bleeding, severe headaches, or abdominal   cramping.  If you are using any tobacco products, including cigarettes, chewing tobacco, and electronic cigarettes.  If you have any questions. Other tests that may be performed during your first trimester include:  Blood tests to find your blood type and to check for the presence of any previous infections. The tests will also be used to check for low iron levels (anemia) and protein on red blood cells (Rh antibodies). Depending on your risk factors, or if you previously had diabetes during pregnancy, you may have tests to check for high blood sugar  that affects pregnant women (gestational diabetes).  Urine tests to check for infections, diabetes, or protein in the urine.  An ultrasound to confirm the proper growth and development of the baby.  Fetal screens for spinal cord problems (spina bifida) and Down syndrome.  HIV (human immunodeficiency virus) testing. Routine prenatal testing includes screening for HIV, unless you choose not to have this test.  You may need other tests to make sure you and the baby are doing well. Follow these instructions at home: Medicines  Follow your health care provider's instructions regarding medicine use. Specific medicines may be either safe or unsafe to take during pregnancy.  Take a prenatal vitamin that contains at least 600 micrograms (mcg) of folic acid.  If you develop constipation, try taking a stool softener if your health care provider approves. Eating and drinking   Eat a balanced diet that includes fresh fruits and vegetables, whole grains, good sources of protein such as meat, eggs, or tofu, and low-fat dairy. Your health care provider will help you determine the amount of weight gain that is right for you.  Avoid raw meat and uncooked cheese. These carry germs that can cause birth defects in the baby.  Eating four or five small meals rather than three large meals a day may help relieve nausea and vomiting. If you start to feel nauseous, eating a few soda crackers can be helpful. Drinking liquids between meals, instead of during meals, also seems to help ease nausea and vomiting.  Limit foods that are high in fat and processed sugars, such as fried and sweet foods.  To prevent constipation: ? Eat foods that are high in fiber, such as fresh fruits and vegetables, whole grains, and beans. ? Drink enough fluid to keep your urine clear or pale yellow. Activity  Exercise only as directed by your health care provider. Most women can continue their usual exercise routine during  pregnancy. Try to exercise for 30 minutes at least 5 days a week. Exercising will help you: ? Control your weight. ? Stay in shape. ? Be prepared for labor and delivery.  Experiencing pain or cramping in the lower abdomen or lower back is a good sign that you should stop exercising. Check with your health care provider before continuing with normal exercises.  Try to avoid standing for long periods of time. Move your legs often if you must stand in one place for a long time.  Avoid heavy lifting.  Wear low-heeled shoes and practice good posture.  You may continue to have sex unless your health care provider tells you not to. Relieving pain and discomfort  Wear a good support bra to relieve breast tenderness.  Take warm sitz baths to soothe any pain or discomfort caused by hemorrhoids. Use hemorrhoid cream if your health care provider approves.  Rest with your legs elevated if you have leg cramps or low back pain.  If you develop varicose veins in   your legs, wear support hose. Elevate your feet for 15 minutes, 3-4 times a day. Limit salt in your diet. Prenatal care  Schedule your prenatal visits by the twelfth week of pregnancy. They are usually scheduled monthly at first, then more often in the last 2 months before delivery.  Write down your questions. Take them to your prenatal visits.  Keep all your prenatal visits as told by your health care provider. This is important. Safety  Wear your seat belt at all times when driving.  Make a list of emergency phone numbers, including numbers for family, friends, the hospital, and police and fire departments. General instructions  Ask your health care provider for a referral to a local prenatal education class. Begin classes no later than the beginning of month 6 of your pregnancy.  Ask for help if you have counseling or nutritional needs during pregnancy. Your health care provider can offer advice or refer you to specialists for help  with various needs.  Do not use hot tubs, steam rooms, or saunas.  Do not douche or use tampons or scented sanitary pads.  Do not cross your legs for long periods of time.  Avoid cat litter boxes and soil used by cats. These carry germs that can cause birth defects in the baby and possibly loss of the fetus by miscarriage or stillbirth.  Avoid all smoking, herbs, alcohol, and medicines not prescribed by your health care provider. Chemicals in these products affect the formation and growth of the baby.  Do not use any products that contain nicotine or tobacco, such as cigarettes and e-cigarettes. If you need help quitting, ask your health care provider. You may receive counseling support and other resources to help you quit.  Schedule a dentist appointment. At home, brush your teeth with a soft toothbrush and be gentle when you floss. Contact a health care provider if:  You have dizziness.  You have mild pelvic cramps, pelvic pressure, or nagging pain in the abdominal area.  You have persistent nausea, vomiting, or diarrhea.  You have a bad smelling vaginal discharge.  You have pain when you urinate.  You notice increased swelling in your face, hands, legs, or ankles.  You are exposed to fifth disease or chickenpox.  You are exposed to German measles (rubella) and have never had it. Get help right away if:  You have a fever.  You are leaking fluid from your vagina.  You have spotting or bleeding from your vagina.  You have severe abdominal cramping or pain.  You have rapid weight gain or loss.  You vomit blood or material that looks like coffee grounds.  You develop a severe headache.  You have shortness of breath.  You have any kind of trauma, such as from a fall or a car accident. Summary  The first trimester of pregnancy is from week 1 until the end of week 13 (months 1 through 3).  Your body goes through many changes during pregnancy. The changes vary from  woman to woman.  You will have routine prenatal visits. During those visits, your health care provider will examine you, discuss any test results you may have, and talk with you about how you are feeling. This information is not intended to replace advice given to you by your health care provider. Make sure you discuss any questions you have with your health care provider. Document Released: 12/23/2000 Document Revised: 12/11/2016 Document Reviewed: 12/11/2015 Elsevier Patient Education  2020 [employer]  

## 2018-08-17 NOTE — Progress Notes (Signed)
28 year old SBF G1, P0 presents for viability/dating ultrasound.  LMP 06/24/2018 normal 4-day cycle.  Denies vaginal bleeding, spotting, urinary symptoms, abdominal pain or fever.  States has had some increased vaginal discharge with odor.  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  Initially was some confusion regarding partner viewing ultrasound, ultrasound tech not aware that procedure recently changed to allow partners to be present, prior no visitors, family allowed due to [place].    Exam: Ultrasound: T/V anteverted uterus seen in fundus of uterus size equal dates by LMP EGA [redacted] weeks 5 days, CRL 7 weeks 4 days.  Right ovary normal.  Left ovary corpus luteal cyst 23 x 26 x 17 mm.  Cervix long and closed.  Negative cul-de-sac. Speculum exam scant white discharge wet prep positive for clues, TNTC bacteria  Early first trimester pregnancy Bacterial vaginosis History of positive syphilis low titer  Plan: Flagyl 500 twice daily for 7 days, prescription given. A copy of ultrasound, Pap, recent labs were given after review of [place] report to take to first prenatal appointment.  Prenatal vitamin daily encouraged, aware to avoid/stop smoking.  Safe pregnancy behaviors reviewed.  Congratulations given and  apologies for initial confusion with ultrasound reviewed.

## 2018-08-17 NOTE — Progress Notes (Deleted)
wet 

## 2018-08-19 ENCOUNTER — Telehealth: Payer: Self-pay

## 2018-08-19 DIAGNOSIS — Z3201 Encounter for pregnancy test, result positive: Secondary | ICD-10-CM | POA: Diagnosis not present

## 2018-08-19 DIAGNOSIS — Z3400 Encounter for supervision of normal first pregnancy, unspecified trimester: Secondary | ICD-10-CM | POA: Diagnosis not present

## 2018-08-19 NOTE — Telephone Encounter (Signed)
Patient called asking a question about her u/s photo and what she was seeing.  I explained that I cannot view an u/s pic and interpret.  I told her that Pam will be back next week and I can have her look at it and call her. She declined stating she will see OB by then and will get them to look at it.  She also wanted to know what wet prep checked for an I informed her of that.

## 2018-08-24 ENCOUNTER — Telehealth: Payer: Self-pay | Admitting: *Deleted

## 2018-08-24 NOTE — Telephone Encounter (Signed)
Patient said she is able to keep fluids down and urinating adequately, no vaginal odor for now, but she did not have symtoms before. No vaginal itching ,but does not slight white discharge.

## 2018-08-24 NOTE — Telephone Encounter (Signed)
Patient called to follow up from [place] on 08/17/18 started Flagyl 500 mg tablet on 08/19/18 and reports vomiting with medication has been in bed x 2 days since taking medication. She asked if another Rx could be prescribed has done well with tindamax in past. Patient said she will not be taking the Flagyl. Please advise

## 2018-08-24 NOTE — Telephone Encounter (Signed)
Please call and review since pregnant best not to use Tindamax, if she is asymptomatic now stop the Flagyl and watch.  Make sure she is able to keep some fluids down and is urinating adequately.  If not please let me know

## 2018-08-24 NOTE — Telephone Encounter (Signed)
Telephone call, states completed 5 days of Flagyl having no discharge with odor.  Will watch for now.

## 2018-09-13 ENCOUNTER — Telehealth: Payer: Self-pay | Admitting: *Deleted

## 2018-09-13 NOTE — Telephone Encounter (Signed)
Please call and review prenatal vitamins or over-the-counter can get at any pharmacy or grocery store.  [facility] excepts Medicaid, they on the second floor of the office building that our office is in.

## 2018-09-13 NOTE — Telephone Encounter (Signed)
Patient called asking if you know of any OB doctors that accept medicaid? Also asked if you would be willing to prescribe prenatal vitamin? Please advise

## 2018-09-14 NOTE — Telephone Encounter (Signed)
Patient informed. 

## 2018-09-17 ENCOUNTER — Encounter (HOSPITAL_COMMUNITY): Payer: Self-pay

## 2018-09-17 ENCOUNTER — Inpatient Hospital Stay (HOSPITAL_COMMUNITY)
Admission: AD | Admit: 2018-09-17 | Discharge: 2018-09-17 | Disposition: A | Discharge status: Home / Self Care | Payer: Medicaid Other | Attending: Obstetrics & Gynecology | Admitting: Obstetrics & Gynecology

## 2018-09-17 ENCOUNTER — Other Ambulatory Visit: Payer: Self-pay

## 2018-09-17 ENCOUNTER — Inpatient Hospital Stay (HOSPITAL_COMMUNITY): Payer: Medicaid Other

## 2018-09-17 DIAGNOSIS — Z3A12 12 weeks gestation of pregnancy: Secondary | ICD-10-CM | POA: Diagnosis not present

## 2018-09-17 DIAGNOSIS — O021 Missed abortion: Secondary | ICD-10-CM | POA: Diagnosis not present

## 2018-09-17 DIAGNOSIS — O209 Hemorrhage in early pregnancy, unspecified: Secondary | ICD-10-CM | POA: Diagnosis not present

## 2018-09-17 DIAGNOSIS — O99331 Smoking (tobacco) complicating pregnancy, first trimester: Secondary | ICD-10-CM | POA: Diagnosis not present

## 2018-09-17 DIAGNOSIS — F17210 Nicotine dependence, cigarettes, uncomplicated: Secondary | ICD-10-CM | POA: Diagnosis not present

## 2018-09-17 DIAGNOSIS — IMO0002 Reserved for concepts with insufficient information to code with codable children: Secondary | ICD-10-CM

## 2018-09-17 LAB — CBC
HCT: 37.2 % (ref 36.0–46.0)
Hemoglobin: 12.4 g/dL (ref 12.0–15.0)
MCH: 30.5 pg (ref 26.0–34.0)
MCHC: 33.3 g/dL (ref 30.0–36.0)
MCV: 91.4 fL (ref 80.0–100.0)
Platelets: 267 10*3/uL (ref 150–400)
RBC: 4.07 MIL/uL (ref 3.87–5.11)
RDW: 12.1 % (ref 11.5–15.5)
WBC: 4.3 10*3/uL (ref 4.0–10.5)
nRBC: 0 % (ref 0.0–0.2)

## 2018-09-17 LAB — ABO/RH: ABO/RH(D): O POS

## 2018-09-17 MED ORDER — OXYCODONE-ACETAMINOPHEN 5-325 MG PO TABS: 2.0000 | ORAL_TABLET | Freq: Four times a day (QID) | ORAL | 0 refills | Status: AC | PRN

## 2018-09-17 MED ORDER — PROMETHAZINE HCL 25 MG PO TABS: 25.0000 mg | ORAL_TABLET | Freq: Four times a day (QID) | ORAL | 0 refills | Status: DC | PRN

## 2018-09-17 MED ORDER — IBUPROFEN 600 MG PO TABS: 600.0000 mg | ORAL_TABLET | Freq: Four times a day (QID) | ORAL | 0 refills | Status: DC | PRN

## 2018-09-17 MED ORDER — MISOPROSTOL 200 MCG PO TABS: 800.0000 ug | ORAL_TABLET | ORAL | 1 refills | Status: DC | PRN

## 2018-09-17 NOTE — MAU Note (Signed)
Brooke Howell is a 28 y.o. at [redacted]w[redacted]d here in MAU reporting: today when she used the bathroom she saw blood coming out and saw some bleeding on the toilet paper. Put on a panty liner but hasn't checked for any more bleeding. Having some left sided pain. No recent IC  Onset of complaint: today  Pain score: 6/10  Vitals:   09/17/18 1412  BP: 114/64  Pulse: 73  Resp: 16  Temp: 98.4 F (36.9 C)  SpO2: 99%     FHT: doppler attempted  Lab orders placed from triage: UA
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
## 2018-09-17 NOTE — MAU Note (Signed)
Urine in lab 

## 2018-09-17 NOTE — Discharge Instructions (Signed)
Return to care  °· If you have heavier bleeding that soaks through more that 2 pads per hour for an hour or more °· If you bleed so much that you feel like you might pass out or you do pass out °· If you have significant abdominal pain that is not improved with Tylenol  °· If you develop a fever > 100.5 ° ° ° °FACTS YOU SHOULD KNOW ° °WHAT IS AN EARLY PREGNANCY FAILURE? °Once the egg is fertilized with the sperm and begins to develop, it attaches to the lining of the uterus. This early pregnancy tissue may not develop into an embryo (the beginning stage of a baby). Sometimes an embryo does develop but does not continue to grow. These problems can be seen on ultrasound.  ° °MANAGEMNT OF EARLY PREGNANCY FAILURE: °About 4 out of 100 (0.25%) women will have a pregnancy loss in her lifetime.  One in five pregnancies is found to be an early pregnancy failure.  There are 3 ways to care for an early pregnancy failure:   °(1) Surgery, (2) Medicine, (3) Waiting for you to pass the pregnancy on your own. °The decision as to how to proceed after being diagnosed with and early pregnancy failure is an individual one.  The decision can be made only after appropriate counseling.  You need to weigh the pros and cons of the 3 choices. Then you can make the choice that works for you. °SURGERY (D&E) °• Procedure over in 1 day °• Requires being put to sleep °• Bleeding may be light °• Possible problems during surgery, including injury to womb(uterus) °• Care provider has more control °Medicine (CYTOTEC) °• The complete procedure may take days to weeks °• No Surgery °• Bleeding may be heavy at times °• There may be drug side effects °• Patient has more control °Waiting °• You may choose to wait, in which case your own body may complete the passing of the abnormal early pregnancy on its own in about 2-4 weeks °• Your bleeding may be heavy at times °• There is a small possibility that you may need surgery if the bleeding is too much or not  all of the pregnancy has passed. °CYTOTEC MANAGEMENT °Prostaglandins (cytotec) are the most widely used drug for this purpose. They cause the uterus to cramp and contract. You will place the medicine yourself inside your vagina in the privacy of your home. Empting of the uterus should occur within 3 days but the process may continue for several weeks. The bleeding may seem heavy at times. °POSSIBLE SIDE EFFECTS FROM CYTOTEC °• Nausea   Vomiting °• Diarrhea Fever °• Chills  Hot Flashes °Side effects  from the process of the early pregnancy failure include: °• Cramping  Bleeding °• Headaches  Dizziness °RISKS: °This is a low risk procedure. Less than 1 in 100 women has a complication. An incomplete passage of the early pregnancy may occur. Also, Hemorrhage (heavy bleeding) could happen.  Rarely the pregnancy will not be passed completely. Excessively heavy bleeding may occur.  Your doctor may need to perform surgery to empty the uterus (D&E). °Afterwards: °Everybody will feel differently after the early pregnancy completion. You may have soreness or cramps for a day or two. You may have soreness or cramps for day or two.  You may have light bleeding for up to 2 weeks. You may be as active as you feel like being. °If you have any of the following problems you may call Maternity Admissions   Unit at [phone].  If you have pain that does not get better  with pain medication  Bleeding that soaks through 2 thick full-sized sanitary pads in an hour  Cramps that last longer than 2 days  Foul smelling discharge  Fever above 100.4 degrees F Even if you do not have any of these symptoms, you should have a follow-up exam to make sure you are healing properly. This appointment will be made for you before you leave the hospital. Your next normal period will start again in 4-6 week after the loss. You can get pregnant soon after the loss, so use birth control right away. Finally: Make sure all your questions are  answered before during and after any procedure. Follow up with medical care and family planning methods.      Managing Pregnancy Loss Pregnancy loss can happen any time during a pregnancy. Often the cause is not known. It is rarely because of anything you did. Pregnancy loss in early pregnancy (during the first trimester) is called a miscarriage. This type of pregnancy loss is the most common. Pregnancy loss that happens after 20 weeks of pregnancy is called fetal demise if the baby's heart stops beating before birth. Fetal demise is much less common. Some women experience spontaneous labor shortly after fetal demise resulting in a stillborn birth (stillbirth). Any pregnancy loss can be devastating. You will need to recover both physically and emotionally. Most women are able to get pregnant again after a pregnancy loss and deliver a healthy baby. How to manage emotional recovery  Pregnancy loss is very hard emotionally. You may feel many different emotions while you grieve. You may feel sad and angry. You may also feel guilty. It is normal to have periods of crying. Emotional recovery can take longer than physical recovery. It is different for everyone. Taking these steps can help you in managing this loss: Remember that it is unlikely you did anything to cause the pregnancy loss. Share your thoughts and feelings with friends, family, and your partner. Remember that your partner is also recovering emotionally. Make sure you have a good support system. Do not spend too much time alone. Meet with a pregnancy loss counselor or join a pregnancy loss support group. Get enough sleep and eat a healthy diet. Return to regular exercise when you have recovered physically. Do not use drugs or alcohol to manage your emotions. Consider seeing a mental health professional to help you recover emotionally. Ask a friend or loved one to help you decide what to do with any clothing and nursery items you received  for your baby. In the case of a stillbirth, many women benefit from taking additional steps in the grieving process. You may want to: Hold your baby after the birth. Name your baby. Request a birth certificate. Create a keepsake such as handprints or footprints. Dress your baby and have a picture taken. Make funeral arrangements. Ask for a baptism or blessing. Hospitals have staff members who can help you with all these arrangements. How to recognize emotional stress It is normal to have emotional stress after a pregnancy loss. But emotional stress that lasts a long time or becomes severe requires treatment. Watch out for these signs of severe emotional stress: Sadness, anger, or guilt that is not going away and is interfering with your normal activities. Relationship problems that have occurred or gotten worse since the pregnancy loss. Signs of depression that last longer than 2 weeks. These may include: Sadness. Anxiety. Hopelessness. Loss  the pregnancy loss. °· Signs of depression that last longer than 2 weeks. These may include: °? Sadness. °? Anxiety. °? Hopelessness. °? Loss of interest in activities you enjoy. °? Inability to concentrate. °? Trouble sleeping or sleeping too much. °? Loss of appetite or overeating. °? Thoughts of death or of hurting yourself. °Follow these instructions at home: °· Take over-the-counter and prescription medicines only as told by your health care provider. °· Rest at home until your energy level returns. Return to your normal activities as told by your health care provider. Ask your health care provider what activities are safe for you. °· When you are ready, meet with your health care provider to discuss steps to take for a future pregnancy. °· Keep all follow-up visits as told by your health care provider. This is important. °Where to find support °· To help you and your partner with the process of grieving, talk with your health care provider or seek counseling. °· Consider meeting with others who have experienced pregnancy loss. Ask your health care  provider about support groups and resources. °Where to find more information °· U.S. Department of Health and Human Services Office on Women's Health: www.womenshealth.gov °· American Pregnancy Association: www.americanpregnancy.org °Contact a health care provider if: °· You continue to experience grief, sadness, or lack of motivation for everyday activities, and those feelings do not improve over time. °· You are struggling to recover emotionally, especially if you are using alcohol or substances to help. °Get help right away if: °· You have thoughts of hurting yourself or others. °If you ever feel like you may hurt yourself or others, or have thoughts about taking your own life, get help right away. You can go to your nearest emergency department or call: °· Your local emergency services (911 in the [place] °· A suicide crisis helpline, such as the National Suicide Prevention Lifeline at 1-800-273-8255. This is open 24 hours a day. °Summary °· Any pregnancy loss can be difficult physically and emotionally. °· You may experience many different emotions while you grieve. Emotional recovery can last longer than physical recovery. °· It is normal to have emotional stress after a pregnancy loss. But emotional stress that lasts a long time or becomes severe requires treatment. °· See your health care provider if you are struggling emotionally after a pregnancy loss. °This information is not intended to replace advice given to you by your health care provider. Make sure you discuss any questions you have with your health care provider. °Document Released: 03/11/2017 Document Revised: 04/20/2018 Document Reviewed: 03/11/2017 °Elsevier Patient Education © 2020 Elsevier Inc. ° °

## 2018-09-20 ENCOUNTER — Encounter: Payer: Self-pay | Admitting: Gynecology

## 2018-09-20 ENCOUNTER — Ambulatory Visit (INDEPENDENT_AMBULATORY_CARE_PROVIDER_SITE_OTHER): Payer: BC Managed Care – PPO | Admitting: Gynecology

## 2018-09-20 ENCOUNTER — Other Ambulatory Visit: Payer: Self-pay

## 2018-09-20 VITALS — BP 120/70

## 2018-09-20 DIAGNOSIS — O021 Missed abortion: Secondary | ICD-10-CM

## 2018-09-20 NOTE — Progress Notes (Signed)
    Karyn Brull 1991/01/05 [id number]        28 y.o.  G2P0010 presents having recently been evaluated 08/17/2018 in the emergency room diagnosed with missed AB.  Was given 800 mcgs of Cytotec.  Had heavy bleeding yesterday and reports passage of fetus.  Bleeding has substantially decreased since then.  Blood type O+  Past medical history,surgical history, problem list, medications, allergies, family history and social history were all reviewed and documented in the EPIC chart.  Directed ROS with pertinent positives and negatives documented in the history of present illness/assessment and plan.  Exam: Copywriter, advertising Vitals:   09/20/18 1140  BP: 120/70   General appearance:  Normal Abdomen soft nontender without masses guarding rebound Pelvic external BUS vagina with POC at external office removed with ring forcep.  Appears to have extruded intact.  Bimanual exam shows uterus generous in size nontender.  Adnexa without masses or tenderness  Assessment/Plan:  28 y.o. G2P0010 with completed AB.  POC retrieved from the external office sent to pathology.  Patient given return precautions.  Will check baseline quantitative hCG today and plan repeat in 1 week.  Will follow titers to 0.    Anastasio Auerbach MD, 11:53 AM 09/20/2018

## 2018-09-20 NOTE — Patient Instructions (Signed)
Follow-up in 1 week to have your blood test repeated.

## 2018-09-21 LAB — TISSUE SPECIMEN

## 2018-09-21 LAB — PATHOLOGY REPORT

## 2018-09-21 LAB — HCG, QUANTITATIVE, PREGNANCY: HCG, Total, QN: 177 m[IU]/mL

## 2018-09-23 ENCOUNTER — Telehealth: Payer: Self-pay

## 2018-09-23 MED ORDER — ZOLPIDEM TARTRATE 10 MG PO TABS: 10.0000 mg | ORAL_TABLET | Freq: Every evening | ORAL | 0 refills | Status: DC | PRN

## 2018-09-23 NOTE — Telephone Encounter (Signed)
1.  She can return to work 2.  Not unusual for bleeding to go away quickly which is good 3.  Not unexpected as she has been through a lot.  If she would like we can prescribe Ambien 10 mg #20 no refill to help with sleeping.  It acts very quickly so take it right before she is ready to go to sleep with no plans for anything for 8 hours.  Rare side effect is patients have reported sleep walking type activities but this is very unusual.

## 2018-09-23 NOTE — Telephone Encounter (Signed)
Saw you 9/8 with missed ab. Has some questions:  1. Can she return to work next week?  She feels like it emotionally and physically and thinks it will do her good to get out.  2.  Bleeding has completely stopped. Only seeing some pink spots with wiping. She is afraid that this is bad and since she is not bleeding she might get a uterine infection.  3. "Have not slept in 5 days."  She said two days ago she slept 4 hours.  She said she is up all night cleaning and doing things because she cannot sleep. She said she never slept at all last night. She said she was not like this before this happened.  Questioned is this normal?

## 2018-09-23 NOTE — Telephone Encounter (Signed)
Spokew with patient and I read patient Dr. Dorette Howell answers.  She would like to try the Ambien. Her mom is staying with her and she will make her aware of the sleep walking warning as well.  Rx called in.

## 2018-09-27 ENCOUNTER — Other Ambulatory Visit: Payer: Self-pay

## 2018-09-27 ENCOUNTER — Other Ambulatory Visit: Payer: BC Managed Care – PPO

## 2018-09-27 DIAGNOSIS — O021 Missed abortion: Secondary | ICD-10-CM | POA: Diagnosis not present

## 2018-09-28 ENCOUNTER — Other Ambulatory Visit: Payer: Self-pay

## 2018-09-28 DIAGNOSIS — O021 Missed abortion: Secondary | ICD-10-CM

## 2018-09-28 LAB — HCG, QUANTITATIVE, PREGNANCY: HCG, Total, QN: 13 m[IU]/mL

## 2018-10-03 ENCOUNTER — Encounter: Payer: Self-pay | Admitting: Gynecology

## 2018-10-03 ENCOUNTER — Other Ambulatory Visit: Payer: Self-pay

## 2018-10-04 ENCOUNTER — Encounter: Payer: Self-pay | Admitting: Women's Health

## 2018-10-04 ENCOUNTER — Ambulatory Visit (INDEPENDENT_AMBULATORY_CARE_PROVIDER_SITE_OTHER): Payer: BC Managed Care – PPO | Admitting: Women's Health

## 2018-10-04 VITALS — BP 118/76

## 2018-10-04 DIAGNOSIS — O021 Missed abortion: Secondary | ICD-10-CM | POA: Diagnosis not present

## 2018-10-04 DIAGNOSIS — N898 Other specified noninflammatory disorders of vagina: Secondary | ICD-10-CM | POA: Diagnosis not present

## 2018-10-04 LAB — WET PREP FOR TRICH, YEAST, CLUE

## 2018-10-04 LAB — HCG, QUANTITATIVE, PREGNANCY: HCG, Total, QN: 5 m[IU]/mL

## 2018-10-04 MED ORDER — NORETHIN ACE-ETH ESTRAD-FE 1-20 MG-MCG PO TABS: 1.0000 | ORAL_TABLET | Freq: Every day | ORAL | 3 refills | Status: DC

## 2018-10-04 MED ORDER — TINIDAZOLE 500 MG PO TABS: ORAL_TABLET | ORAL | 0 refills | Status: DC

## 2018-10-04 NOTE — Progress Notes (Addendum)
28 y.o. G4 P0 presents with complaints of vaginal odor for several days.  08/17/2018 viability/dating ultrasound positive fetal heart tones 7 weeks 4 days.  ER visit on 09/17/18 with spotting, [place] confirmed missed abortion, treated with Cytotec had incomplete expulsion was seen here in the office Dr. Phineas Real expelled remaining tissue 09/17/2018. Somewhat tearful with self blame.  Denies vaginal itching, abdominal/back pain, urinary symptoms or fever.  Restarted back smoking. No active bleeding past 2 weeks. Requesting birth control. Quantitative HCG 09/20/18 was 177, on 9/15 was 13.   Exam: tearful, No physical distress. Grieving. External genitalia normal. Speculum exam reveals white discharge with odor. Wet prep, Clue cells, TNTC bacteria.  Bimanual no CMT or adnexal tenderness uterus small.  Bacterial Vaginosis Contraception management  Plan: treatment options reviewed, Tinidazole 500mg  PO, take 4 pills today and repeat tomorrow. Emotional support offered. Birth control options reviewed. Wants to start OCPs, risk reviewed for blood clot and stroke reviewed.  Loestrin 1/20 prescription, proper use, given start up instructions discussed reviewed importance of condoms until after first month.  Smoking cessation strongly encouraged with OCPs. Lab for Quantitative HCG today.

## 2018-10-04 NOTE — Patient Instructions (Signed)

## 2018-10-06 ENCOUNTER — Encounter: Payer: Self-pay | Admitting: Gynecology

## 2018-10-17 ENCOUNTER — Encounter: Payer: BLUE CROSS/BLUE SHIELD | Admitting: Family Medicine

## 2018-10-20 NOTE — Telephone Encounter (Signed)
Spoke with patient and read her the My Chart email that was returned unread.

## 2018-11-01 ENCOUNTER — Ambulatory Visit (INDEPENDENT_AMBULATORY_CARE_PROVIDER_SITE_OTHER): Payer: BC Managed Care – PPO | Admitting: Women's Health

## 2018-11-01 ENCOUNTER — Other Ambulatory Visit: Payer: Self-pay

## 2018-11-01 ENCOUNTER — Encounter: Payer: Self-pay | Admitting: Women's Health

## 2018-11-01 VITALS — BP 118/78

## 2018-11-01 DIAGNOSIS — N760 Acute vaginitis: Secondary | ICD-10-CM

## 2018-11-01 DIAGNOSIS — B9689 Other specified bacterial agents as the cause of diseases classified elsewhere: Secondary | ICD-10-CM

## 2018-11-01 DIAGNOSIS — B373 Candidiasis of vulva and vagina: Secondary | ICD-10-CM | POA: Diagnosis not present

## 2018-11-01 DIAGNOSIS — B3731 Acute candidiasis of vulva and vagina: Secondary | ICD-10-CM

## 2018-11-01 LAB — WET PREP FOR TRICH, YEAST, CLUE

## 2018-11-01 MED ORDER — TINIDAZOLE 500 MG PO TABS: ORAL_TABLET | ORAL | 0 refills | Status: DC

## 2018-11-01 MED ORDER — FLUCONAZOLE 150 MG PO TABS: 150.0000 mg | ORAL_TABLET | Freq: Once | ORAL | 1 refills | Status: AC

## 2018-11-01 NOTE — Progress Notes (Signed)
28 year old G4, P0 presents with complaint of vaginal itching for the last few days.  Has not used anything over-the-counter for relief.  Denies vaginal odor, abdominal/back pain or fever.  Not sexually active.  Has had 1 cycle since AB on 09/17/2018.  Has not started on OCs, will start with next cycle.  Smoker less than 1/2 pack a day.  Exam: Appears well.  No CVAT.  Abdomen soft, nontender, external genitalia mild erythema at introitus, speculum exam scant amount of a white discharge noted, vaginal walls mild erythema, wet prep positive for clue cells, TNTC bacteria.  Bimanual no CMT or adnexal tenderness.  Bacterial vaginosis  Plan: Options for treatment reviewed, prefers Tindamax 4 g today, repeat tomorrow.  Prescription given, also gave Diflucan 150 p.o. x1 dose due to the vaginal itching.  Instructed to call if continued symptoms.  Start up instructions for OCs reviewed, questions answered.  Reviewed importance of condoms if becomes sexually active.

## 2018-11-01 NOTE — Patient Instructions (Signed)
Bacterial Vaginosis  Bacterial vaginosis is a vaginal infection that occurs when the normal balance of bacteria in the vagina is disrupted. It results from an overgrowth of certain bacteria. This is the most common vaginal infection among women ages 15-44. Because bacterial vaginosis increases your risk for STIs (sexually transmitted infections), getting treated can help reduce your risk for chlamydia, gonorrhea, herpes, and HIV (human immunodeficiency virus). Treatment is also important for preventing complications in pregnant women, because this condition can cause an early (premature) delivery. What are the causes? This condition is caused by an increase in harmful bacteria that are normally present in small amounts in the vagina. However, the reason that the condition develops is not fully understood. What increases the risk? The following factors may make you more likely to develop this condition:  Having a new sexual partner or multiple sexual partners.  Having unprotected sex.  Douching.  Having an intrauterine device (IUD).  Smoking.  Drug and alcohol abuse.  Taking certain antibiotic medicines.  Being pregnant. You cannot get bacterial vaginosis from toilet seats, bedding, swimming pools, or contact with objects around you. What are the signs or symptoms? Symptoms of this condition include:  Grey or white vaginal discharge. The discharge can also be watery or foamy.  A fish-like odor with discharge, especially after sexual intercourse or during menstruation.  Itching in and around the vagina.  Burning or pain with urination. Some women with bacterial vaginosis have no signs or symptoms. How is this diagnosed? This condition is diagnosed based on:  Your medical history.  A physical exam of the vagina.  Testing a sample of vaginal fluid under a microscope to look for a large amount of bad bacteria or abnormal cells. Your health care provider may use a cotton swab or  a small wooden spatula to collect the sample. How is this treated? This condition is treated with antibiotics. These may be given as a pill, a vaginal cream, or a medicine that is put into the vagina (suppository). If the condition comes back after treatment, a second round of antibiotics may be needed. Follow these instructions at home: Medicines  Take over-the-counter and prescription medicines only as told by your health care provider.  Take or use your antibiotic as told by your health care provider. Do not stop taking or using the antibiotic even if you start to feel better. General instructions  If you have a female sexual partner, tell her that you have a vaginal infection. She should see her health care provider and be treated if she has symptoms. If you have a female sexual partner, he does not need treatment.  During treatment: ? Avoid sexual activity until you finish treatment. ? Do not douche. ? Avoid alcohol as directed by your health care provider. ? Avoid breastfeeding as directed by your health care provider.  Drink enough water and fluids to keep your urine clear or pale yellow.  Keep the area around your vagina and rectum clean. ? Wash the area daily with warm water. ? Wipe yourself from front to back after using the toilet.  Keep all follow-up visits as told by your health care provider. This is important. How is this prevented?  Do not douche.  Wash the outside of your vagina with warm water only.  Use protection when having sex. This includes latex condoms and dental dams.  Limit how many sexual partners you have. To help prevent bacterial vaginosis, it is best to have sex with just one partner (  monogamous).  Make sure you and your sexual partner are tested for STIs.  Wear cotton or cotton-lined underwear.  Avoid wearing tight pants and pantyhose, especially during summer.  Limit the amount of alcohol that you drink.  Do not use any products that contain  nicotine or tobacco, such as cigarettes and e-cigarettes. If you need help quitting, ask your health care provider.  Do not use illegal drugs. Where to find more information  Centers for Disease Control and Prevention: www.cdc.gov/std  American Sexual Health Association (ASHA): www.ashastd.org  U.S[place] Department of Health and Human Services, Office on Women's Health: www.womenshealth.gov/ or https://www.womenshealth.gov/a-z-topics/bacterial-vaginosis Contact a health care provider if:  Your symptoms do not improve, even after treatment.  You have more discharge or pain when urinating.  You have a fever.  You have pain in your abdomen.  You have pain during sex.  You have vaginal bleeding between periods. Summary  Bacterial vaginosis is a vaginal infection that occurs when the normal balance of bacteria in the vagina is disrupted.  Because bacterial vaginosis increases your risk for STIs (sexually transmitted infections), getting treated can help reduce your risk for chlamydia, gonorrhea, herpes, and HIV (human immunodeficiency virus). Treatment is also important for preventing complications in pregnant women, because the condition can cause an early (premature) delivery.  This condition is treated with antibiotic medicines. These may be given as a pill, a vaginal cream, or a medicine that is put into the vagina (suppository). This information is not intended to replace advice given to you by your health care provider. Make sure you discuss any questions you have with your health care provider. Document Released: 12/29/2004 Document Revised: 12/11/2016 Document Reviewed: 09/14/2015 Elsevier Patient Education  2020 Elsevier Inc.  

## 2018-11-14 ENCOUNTER — Ambulatory Visit (INDEPENDENT_AMBULATORY_CARE_PROVIDER_SITE_OTHER): Payer: BC Managed Care – PPO | Admitting: Gynecology

## 2018-11-14 ENCOUNTER — Other Ambulatory Visit: Payer: Self-pay

## 2018-11-14 ENCOUNTER — Encounter: Payer: Self-pay | Admitting: Gynecology

## 2018-11-14 VITALS — BP 122/78

## 2018-11-14 DIAGNOSIS — Z113 Encounter for screening for infections with a predominantly sexual mode of transmission: Secondary | ICD-10-CM | POA: Diagnosis not present

## 2018-11-14 DIAGNOSIS — N898 Other specified noninflammatory disorders of vagina: Secondary | ICD-10-CM

## 2018-11-14 DIAGNOSIS — Z1159 Encounter for screening for other viral diseases: Secondary | ICD-10-CM | POA: Diagnosis not present

## 2018-11-14 LAB — WET PREP FOR TRICH, YEAST, CLUE

## 2018-11-14 MED ORDER — TINIDAZOLE 500 MG PO TABS: 2.0000 g | ORAL_TABLET | Freq: Once | ORAL | 1 refills | Status: AC

## 2018-11-14 NOTE — Progress Notes (Signed)
    Brooke Howell [birth year] [id number]        28 y.o.  G4P0030 presents with several days of vaginal discharge itching and irritation.  No odor.  No urinary symptoms such as frequency dysuria urgency.  Also requests STD screening.  No known exposure but would like to be screened to include serum screening.  Past medical history,surgical history, problem list, medications, allergies, family history and social history were all reviewed and documented in the EPIC chart.  Directed ROS with pertinent positives and negatives documented in the history of present illness/assessment and plan.  Exam: Caryn Bee assistant Vitals:   11/14/18 0946  BP: 122/78   General appearance:  Normal Abdomen soft nontender without mass guarding rebound Pelvic external BUS vagina with thick white discharge.  Cervix normal.  GC/chlamydia screen.  Uterus normal size midline mobile nontender.  Adnexa without masses or tenderness  Assessment/Plan:  28 y.o. [id number] with history and exam as above.  Wet prep is consistent with bacterial vaginosis.  Patient requests Tindamax 4 tabs 1 day and 4 tabs the next.  Alcohol avoidance reviewed.  I did give her a refill on this as she is familiar with her symptoms in the case she has a recurrence.  Will follow-up for serum STD screening to include HIV, RPR, hepatitis B, hepatitis C and her GC/chlamydia screen    Anastasio Auerbach MD, 9:55 AM 11/14/2018

## 2018-11-14 NOTE — Patient Instructions (Signed)
Take the Tindamax medication 4 pills 1 day and 4 pills the next day as we discussed.  Follow-up if your symptoms persist.  Avoid alcohol while taking

## 2018-11-15 LAB — HEPATITIS C ANTIBODY
Hepatitis C Ab: NONREACTIVE
SIGNAL TO CUT-OFF: 0.02 (ref <1.00)

## 2018-11-15 LAB — C. TRACHOMATIS/N. GONORRHOEAE RNA
C. trachomatis RNA, TMA: NOT DETECTED
N. gonorrhoeae RNA, TMA: NOT DETECTED

## 2018-11-15 LAB — RPR: RPR Ser Ql: REACTIVE — AB

## 2018-11-15 LAB — FLUORESCENT TREPONEMAL AB(FTA)-IGG-BLD: Fluorescent Treponemal ABS: REACTIVE — AB

## 2018-11-15 LAB — RPR TITER: RPR Titer: 1:2 {titer} — ABNORMAL HIGH

## 2018-11-15 LAB — HEPATITIS B SURFACE ANTIGEN: Hepatitis B Surface Ag: NONREACTIVE

## 2018-11-15 LAB — HIV ANTIBODY (ROUTINE TESTING W REFLEX): HIV 1&2 Ab, 4th Generation: NONREACTIVE

## 2019-01-16 ENCOUNTER — Ambulatory Visit (INDEPENDENT_AMBULATORY_CARE_PROVIDER_SITE_OTHER): Payer: BC Managed Care – PPO | Admitting: Women's Health

## 2019-01-16 ENCOUNTER — Other Ambulatory Visit: Payer: Self-pay

## 2019-01-16 ENCOUNTER — Encounter: Payer: Self-pay | Admitting: Women's Health

## 2019-01-16 VITALS — BP 120/82

## 2019-01-16 DIAGNOSIS — Z113 Encounter for screening for infections with a predominantly sexual mode of transmission: Secondary | ICD-10-CM

## 2019-01-16 LAB — WET PREP FOR TRICH, YEAST, CLUE

## 2019-01-16 MED ORDER — TINIDAZOLE 500 MG PO TABS: ORAL_TABLET | ORAL | 0 refills | Status: DC

## 2019-01-16 NOTE — Progress Notes (Signed)
29 year old SBF G4, P0 presents with complaint of increased vaginal white discharge with questionable odor and mild itching for the last 3 days.  Denies urinary symptoms, abdominal/back pain or fever.  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  Questionable infidelity of partner, requesting STD screen.  Monthly cycle/Loestrin/condoms.  Travel CMA.  No medical problems.  Exam: Appears well.  No CVAT.  Abdomen soft, obese, nontender, external genitalia within normal limits, no visible erythema, speculum exam scant white discharge vaginal walls healthy, no erythema, wet prep positive for clues, TNTC bacteria, no yeast.  GC/chlamydia culture taken.  Bimanual no CMT or adnexal tenderness.  Bacterial vaginosis STD screen  Plan: Tindamax 2 g today, repeat tomorrow prescription, proper use and alcohol precautions given.  Boric acid gelcap suppression discussed, has used in the past will try again..  Encouraged to continue condoms, GC/chlamydia culture pending, HIV, RPR.

## 2019-01-16 NOTE — Patient Instructions (Signed)
Bacterial Vaginosis  Bacterial vaginosis is a vaginal infection that occurs when the normal balance of bacteria in the vagina is disrupted. It results from an overgrowth of certain bacteria. This is the most common vaginal infection among women ages 15-44. Because bacterial vaginosis increases your risk for STIs (sexually transmitted infections), getting treated can help reduce your risk for chlamydia, gonorrhea, herpes, and HIV (human immunodeficiency virus). Treatment is also important for preventing complications in pregnant women, because this condition can cause an early (premature) delivery. What are the causes? This condition is caused by an increase in harmful bacteria that are normally present in small amounts in the vagina. However, the reason that the condition develops is not fully understood. What increases the risk? The following factors may make you more likely to develop this condition:  Having a new sexual partner or multiple sexual partners.  Having unprotected sex.  Douching.  Having an intrauterine device (IUD).  Smoking.  Drug and alcohol abuse.  Taking certain antibiotic medicines.  Being pregnant. You cannot get bacterial vaginosis from toilet seats, bedding, swimming pools, or contact with objects around you. What are the signs or symptoms? Symptoms of this condition include:  Grey or white vaginal discharge. The discharge can also be watery or foamy.  A fish-like odor with discharge, especially after sexual intercourse or during menstruation.  Itching in and around the vagina.  Burning or pain with urination. Some women with bacterial vaginosis have no signs or symptoms. How is this diagnosed? This condition is diagnosed based on:  Your medical history.  A physical exam of the vagina.  Testing a sample of vaginal fluid under a microscope to look for a large amount of bad bacteria or abnormal cells. Your health care provider may use a cotton swab or  a small wooden spatula to collect the sample. How is this treated? This condition is treated with antibiotics. These may be given as a pill, a vaginal cream, or a medicine that is put into the vagina (suppository). If the condition comes back after treatment, a second round of antibiotics may be needed. Follow these instructions at home: Medicines  Take over-the-counter and prescription medicines only as told by your health care provider.  Take or use your antibiotic as told by your health care provider. Do not stop taking or using the antibiotic even if you start to feel better. General instructions  If you have a female sexual partner, tell her that you have a vaginal infection. She should see her health care provider and be treated if she has symptoms. If you have a female sexual partner, he does not need treatment.  During treatment: ? Avoid sexual activity until you finish treatment. ? Do not douche. ? Avoid alcohol as directed by your health care provider. ? Avoid breastfeeding as directed by your health care provider.  Drink enough water and fluids to keep your urine clear or pale yellow.  Keep the area around your vagina and rectum clean. ? Wash the area daily with warm water. ? Wipe yourself from front to back after using the toilet.  Keep all follow-up visits as told by your health care provider. This is important. How is this prevented?  Do not douche.  Wash the outside of your vagina with warm water only.  Use protection when having sex. This includes latex condoms and dental dams.  Limit how many sexual partners you have. To help prevent bacterial vaginosis, it is best to have sex with just one partner (  monogamous).  Make sure you and your sexual partner are tested for STIs.  Wear cotton or cotton-lined underwear.  Avoid wearing tight pants and pantyhose, especially during summer.  Limit the amount of alcohol that you drink.  Do not use any products that contain  nicotine or tobacco, such as cigarettes and e-cigarettes. If you need help quitting, ask your health care provider.  Do not use illegal drugs. Where to find more information  Centers for Disease Control and Prevention: www.cdc.gov/std  American Sexual Health Association (ASHA): www.ashastd.org  U.S. Department of Health and Human Services, Office on Women's Health: www.womenshealth.gov/ or https://www.womenshealth.gov/a-z-topics/bacterial-vaginosis Contact a health care provider if:  Your symptoms do not improve, even after treatment.  You have more discharge or pain when urinating.  You have a fever.  You have pain in your abdomen.  You have pain during sex.  You have vaginal bleeding between periods. Summary  Bacterial vaginosis is a vaginal infection that occurs when the normal balance of bacteria in the vagina is disrupted.  Because bacterial vaginosis increases your risk for STIs (sexually transmitted infections), getting treated can help reduce your risk for chlamydia, gonorrhea, herpes, and HIV (human immunodeficiency virus). Treatment is also important for preventing complications in pregnant women, because the condition can cause an early (premature) delivery.  This condition is treated with antibiotic medicines. These may be given as a pill, a vaginal cream, or a medicine that is put into the vagina (suppository). This information is not intended to replace advice given to you by your health care provider. Make sure you discuss any questions you have with your health care provider. Document Revised: 12/11/2016 Document Reviewed: 09/14/2015 Elsevier Patient Education  2020 Elsevier Inc.  

## 2019-01-17 LAB — CHLAMYDIA PROBE AMP THINPREP: C. trachomatis RNA, TMA: NOT DETECTED

## 2019-01-17 LAB — GC PROBE AMP THINPREP: N. gonorrhoeae RNA, TMA: NOT DETECTED

## 2019-01-31 ENCOUNTER — Other Ambulatory Visit: Payer: Self-pay

## 2019-02-02 ENCOUNTER — Telehealth: Payer: BC Managed Care – PPO | Admitting: Physician Assistant

## 2019-02-02 DIAGNOSIS — N898 Other specified noninflammatory disorders of vagina: Secondary | ICD-10-CM

## 2019-02-02 MED ORDER — METRONIDAZOLE 500 MG PO TABS: 500.0000 mg | ORAL_TABLET | Freq: Two times a day (BID) | ORAL | 0 refills | Status: DC

## 2019-02-02 NOTE — Progress Notes (Signed)
We are sorry that you are not feeling well. Here is how we plan to help! Based on what you shared with me it looks like you: May have a vaginosis due to bacteria   I reviewed your record from 01/16/2019. Your STD screen was negative.  It appears you were treated for bacterial vaginosis.  I will prescribe flagyl for a second round of treatment.  If you have no improvement in several days you will need to be seen in the office for a face-to-face visit for further evaluation of your ongoing symptoms.  Vaginosis is an inflammation of the vagina that can result in discharge, itching and pain. The cause is usually a change in the normal balance of vaginal bacteria or an infection. Vaginosis can also result from reduced estrogen levels after menopause.  The most common causes of vaginosis are:   Bacterial vaginosis which results from an overgrowth of one on several organisms that are normally present in your vagina.   Yeast infections which are caused by a naturally occurring fungus called candida.   Vaginal atrophy (atrophic vaginosis) which results from the thinning of the vagina from reduced estrogen levels after menopause.   Trichomoniasis which is caused by a parasite and is commonly transmitted by sexual intercourse.  Factors that increase your risk of developing vaginosis include: Marland Kitchen Medications, such as antibiotics and steroids . Uncontrolled diabetes . Use of hygiene products such as bubble bath, vaginal spray or vaginal deodorant . Douching . Wearing damp or tight-fitting clothing . Using an intrauterine device (IUD) for birth control . Hormonal changes, such as those associated with pregnancy, birth control pills or menopause . Sexual activity . Having a sexually transmitted infection  Your treatment plan is Metronidazole or Flagyl 500mg  twice a day for 7 days.  I have electronically sent this prescription into the pharmacy that you have chosen.  Be sure to take all of the medication  as directed. Stop taking any medication if you develop a rash, tongue swelling or shortness of breath. Mothers who are breast feeding should consider pumping and discarding their breast milk while on these antibiotics. However, there is no consensus that infant exposure at these doses would be harmful.  Remember that medication creams can weaken latex condoms. Marland Kitchen   HOME CARE:  Good hygiene may prevent some types of vaginosis from recurring and may relieve some symptoms:  . Avoid baths, hot tubs and whirlpool spas. Rinse soap from your outer genital area after a shower, and dry the area well to prevent irritation. Don't use scented or harsh soaps, such as those with deodorant or antibacterial action. Marland Kitchen Avoid irritants. These include scented tampons and pads. . Wipe from front to back after using the toilet. Doing so avoids spreading fecal bacteria to your vagina.  Other things that may help prevent vaginosis include:  Marland Kitchen Don't douche. Your vagina doesn't require cleansing other than normal bathing. Repetitive douching disrupts the normal organisms that reside in the vagina and can actually increase your risk of vaginal infection. Douching won't clear up a vaginal infection. . Use a latex condom. Both female and female latex condoms may help you avoid infections spread by sexual contact. . Wear cotton underwear. Also wear pantyhose with a cotton crotch. If you feel comfortable without it, skip wearing underwear to bed. Yeast thrives in [employer] Your symptoms should improve in the next day or two.  GET HELP RIGHT AWAY IF:  . You have pain in your lower abdomen ( pelvic  area or over your ovaries) . You develop nausea or vomiting . You develop a fever . Your discharge changes or worsens . You have persistent pain with intercourse . You develop shortness of breath, a rapid pulse, or you faint.  These symptoms could be signs of problems or infections that need to be evaluated by a medical  provider now.  MAKE SURE YOU    Understand these instructions.  Will watch your condition.  Will get help right away if you are not doing well or get worse.  Your e-visit answers were reviewed by a board certified advanced clinical practitioner to complete your personal care plan. Depending upon the condition, your plan could have included both over the counter or prescription medications. Please review your pharmacy choice to make sure that you have choses a pharmacy that is open for you to pick up any needed prescription, Your safety is important to [place]. If you have drug allergies check your prescription carefully.   You can use MyChart to ask questions about today's visit, request a non-urgent call back, or ask for a work or school excuse for 24 hours related to this e-Visit. If it has been greater than 24 hours you will need to follow up with your provider, or enter a new e-Visit to address those concerns. You will get a MyChart message within the next two days asking about your experience. I hope that your e-visit has been valuable and will speed your recovery.  Greater than 5 minutes, yet less than 10 minutes of time have been spent researching, coordinating, and implementing care for this patient today

## 2019-02-16 ENCOUNTER — Other Ambulatory Visit: Payer: Self-pay

## 2019-02-20 ENCOUNTER — Other Ambulatory Visit: Payer: Self-pay

## 2019-02-20 ENCOUNTER — Ambulatory Visit (INDEPENDENT_AMBULATORY_CARE_PROVIDER_SITE_OTHER): Payer: BC Managed Care – PPO | Admitting: Women's Health

## 2019-02-20 ENCOUNTER — Encounter: Payer: Self-pay | Admitting: Women's Health

## 2019-02-20 VITALS — BP 118/78

## 2019-02-20 DIAGNOSIS — N898 Other specified noninflammatory disorders of vagina: Secondary | ICD-10-CM

## 2019-02-20 LAB — WET PREP FOR TRICH, YEAST, CLUE

## 2019-02-20 MED ORDER — FLUCONAZOLE 150 MG PO TABS: ORAL_TABLET | ORAL | 1 refills | Status: DC

## 2019-02-20 MED ORDER — TINIDAZOLE 500 MG PO TABS: ORAL_TABLET | ORAL | 0 refills | Status: DC

## 2019-02-20 NOTE — Patient Instructions (Signed)
Vag Bacterial Vaginosis  Bacterial vaginosis is an infection of the vagina. It happens when too many normal germs (healthy bacteria) grow in the vagina. This infection puts you at risk for infections from sex (STIs). Treating this infection can lower your risk for some STIs. You should also treat this if you are pregnant. It can cause your baby to be born early. Follow these instructions at home: Medicines  Take over-the-counter and prescription medicines only as told by your doctor.  Take or use your antibiotic medicine as told by your doctor. Do not stop taking or using it even if you start to feel better. General instructions  If you your sexual partner is a woman, tell her that you have this infection. She needs to get treatment if she has symptoms. If you have a female partner, he does not need to be treated.  During treatment: ? Avoid sex. ? Do not douche. ? Avoid alcohol as told. ? Avoid breastfeeding as told.  Drink enough fluid to keep your pee (urine) clear or pale yellow.  Keep your vagina and butt (rectum) clean. ? Wash the area with warm water every day. ? Wipe from front to back after you use the toilet.  Keep all follow-up visits as told by your doctor. This is important. Preventing this condition  Do not douche.  Use only warm water to wash around your vagina.  Use protection when you have sex. This includes: ? Latex condoms. ? Dental dams.  Limit how many people you have sex with. It is best to only have sex with the same person (be monogamous).  Get tested for STIs. Have your partner get tested.  Wear underwear that is cotton or lined with cotton.  Avoid tight pants and pantyhose. This is most important in summer.  Do not use any products that have nicotine or tobacco in them. These include cigarettes and e-cigarettes. If you need help quitting, ask your doctor.  Do not use illegal drugs.  Limit how much alcohol you drink. Contact a doctor if:  Your  symptoms do not get better, even after you are treated.  You have more discharge or pain when you pee (urinate).  You have a fever.  You have pain in your belly (abdomen).  You have pain with sex.  Your bleed from your vagina between periods. Summary  This infection happens when too many germs (bacteria) grow in the vagina.  Treating this condition can lower your risk for some infections from sex (STIs).  You should also treat this if you are pregnant. It can cause early (premature) birth.  Do not stop taking or using your antibiotic medicine even if you start to feel better. This information is not intended to replace advice given to you by your health care provider. Make sure you discuss any questions you have with your health care provider. Document Revised: 12/11/2016 Document Reviewed: 09/14/2015 Elsevier Patient Education  2020 [employer].  that causes vaginal discharge as well as soreness, swelling, and redness (inflammation) of the vagina. This is a common condition. Some women get this infection frequently. What are the causes? This condition is caused by a change in the normal balance of the yeast (candida) and bacteria that live in the vagina. This change causes an overgrowth of yeast, which causes the inflammation. What increases the risk? The condition is more likely to develop in women who:  Take antibiotic medicines.  Have diabetes.  Take birth control pills.  Are pregnant.  Douche  often.  Have a weak body defense system (immune system).  Have been taking steroid medicines for a long time.  Frequently wear tight clothing. What are the signs or symptoms? Symptoms of this condition include:  White, thick, creamy vaginal discharge.  Swelling, itching, redness, and irritation of the vagina. The lips of the vagina (vulva) may be affected as well.  Pain or a burning feeling while urinating.  Pain during sex. How is this diagnosed? This condition is  diagnosed based on:  Your medical history.  A physical exam.  A pelvic exam. Your health care provider will examine a sample of your vaginal discharge under a microscope. Your health care provider may send this sample for testing to confirm the diagnosis. How is this treated? This condition is treated with medicine. Medicines may be over-the-counter or prescription. You may be told to use one or more of the following:  Medicine that is taken by mouth (orally).  Medicine that is applied as a cream (topically).  Medicine that is inserted directly into the vagina (suppository). Follow these instructions at home:  Lifestyle  Do not have sex until your health care provider approves. Tell your sex partner that you have a yeast infection. That person should go to his or her health care provider and ask if they should also be treated.  Do not wear tight clothes, such as pantyhose or tight pants.  Wear breathable cotton underwear. General instructions  Take or apply over-the-counter and prescription medicines only as told by your health care provider.  Eat more yogurt. This may help to keep your yeast infection from returning.  Do not use tampons until your health care provider approves.  Try taking a sitz bath to help with discomfort. This is a warm water bath that is taken while you are sitting down. The water should only come up to your hips and should cover your buttocks. Do this 3-4 times per day or as told by your health care provider.  Do not douche.  If you have diabetes, keep your blood sugar levels under control.  Keep all follow-up visits as told by your health care provider. This is important. Contact a health care provider if:  You have a fever.  Your symptoms go away and then return.  Your symptoms do not get better with treatment.  Your symptoms get worse.  You have new symptoms.  You develop blisters in or around your vagina.  You have blood coming from  your vagina and it is not your menstrual period.  You develop pain in your abdomen. Summary  Vaginal yeast infection is a condition that causes discharge as well as soreness, swelling, and redness (inflammation) of the vagina.  This condition is treated with medicine. Medicines may be over-the-counter or prescription.  Take or apply over-the-counter and prescription medicines only as told by your health care provider.  Do not douche. Do not have sex or use tampons until your health care provider approves.  Contact a health care provider if your symptoms do not get better with treatment or your symptoms go away and then return. This information is not intended to replace advice given to you by your health care provider. Make sure you discuss any questions you have with your health care provider. Document Revised: 07/29/2018 Document Reviewed: 05/17/2017 Elsevier Patient Education  2020 [employer].

## 2019-02-20 NOTE — Progress Notes (Signed)
29 year old SPF G4, P0 presents with complaint of vaginal itching and white discharge, states thinks has yeast.  Was treated for BV via televisit 02/02/2019 with Flagyl but was unable to complete the medication due to med making her sick with vomiting.  Monthly cycle/not sexually active.  Denies urinary symptoms, abdominal/ back pain, or fever.  Negative STD screen 01/16/2019 has not been sexually active since.  Monthly cycle on no contraception.  Had been on Loestrin in the past declines.  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  Exam: Appears well.  No CVAT.  Abdomen soft, obese, nontender, external genitalia within normal limits, speculum exam moderate amount of white discharge with odor, wet prep positive for clues, moderate bacteria.  Bimanual no CMT or adnexal tenderness.  Bacterial vaginosis with vaginal itching  Plan: Tindamax 4 g p.o. x1 dose, boric acid gelcaps 600 mg per vagina twice weekly reviewed, will get on [employer].  Diflucan 150 p.o. today and repeat in 3 days if necessary prescription, proper use given and reviewed.  Instructed to call if persistent vaginal itching /discharge.  Contraception discussed declines states will use condoms if becomes sexually active.

## 2019-02-28 ENCOUNTER — Telehealth: Payer: Self-pay | Admitting: *Deleted

## 2019-02-28 NOTE — Telephone Encounter (Addendum)
Patient called c/o bleeding now x 7 days, LMP:02/22/19 reports flow this time is heavier, wears tampons and pads, changing about every 2 hours, noticed small blood clots,  Reports last night the "buttom" of her stomach started hurting, stopped birth control pills 1 week ago, last sexual intercourse was 02/15/19. No vaginal odor, no discharge. No new partner. I did recommend office visit,however she asked me to get your thoughts. Please advise

## 2019-02-28 NOTE — Telephone Encounter (Signed)
Patient will check with UPT, reports she stopped pills because she couldn't remember to take them in this pack. Works 3rd shift and skipped a lot of pills so she just stopped them. Declined depo-provera, declined nuvaring, had Nexplanon and didn't like that so had removed. Patient said she will restart the current pills, will check UPT first.

## 2019-02-28 NOTE — Telephone Encounter (Signed)
Have her check a home UPT, if your reason she stopped the birth control pills?  Does she want to try something different?

## 2019-03-01 MED ORDER — MEDROXYPROGESTERONE ACETATE 10 MG PO TABS: 10.0000 mg | ORAL_TABLET | Freq: Every day | ORAL | 0 refills | Status: DC

## 2019-03-01 NOTE — Telephone Encounter (Signed)
Patient called back and said UPT was negative, no more stomach pain, but still bleeding.

## 2019-03-01 NOTE — Telephone Encounter (Signed)
Patient informed. Rx sent 

## 2019-03-01 NOTE — Telephone Encounter (Signed)
Provera 10 mg po daily for 10 days should stop the bleeding and then start back on OC's either first day of bleeing or Sunday after cycle starts (Sunday start helps to prevent cycles from coming on WE's)

## 2019-03-09 ENCOUNTER — Other Ambulatory Visit: Payer: Self-pay

## 2019-03-10 ENCOUNTER — Encounter: Payer: Self-pay | Admitting: Obstetrics and Gynecology

## 2019-03-10 ENCOUNTER — Ambulatory Visit (INDEPENDENT_AMBULATORY_CARE_PROVIDER_SITE_OTHER): Payer: BC Managed Care – PPO | Admitting: Obstetrics and Gynecology

## 2019-03-10 ENCOUNTER — Other Ambulatory Visit: Payer: Self-pay

## 2019-03-10 VITALS — BP 118/76

## 2019-03-10 DIAGNOSIS — R102 Pelvic and perineal pain: Secondary | ICD-10-CM | POA: Diagnosis not present

## 2019-03-10 DIAGNOSIS — N760 Acute vaginitis: Secondary | ICD-10-CM | POA: Diagnosis not present

## 2019-03-10 DIAGNOSIS — B9689 Other specified bacterial agents as the cause of diseases classified elsewhere: Secondary | ICD-10-CM

## 2019-03-10 DIAGNOSIS — N898 Other specified noninflammatory disorders of vagina: Secondary | ICD-10-CM | POA: Diagnosis not present

## 2019-03-10 LAB — URINALYSIS, COMPLETE W/RFL CULTURE
Bacteria, UA: NONE SEEN /HPF
Bilirubin Urine: NEGATIVE
Glucose, UA: NEGATIVE
Hgb urine dipstick: NEGATIVE
Hyaline Cast: NONE SEEN /LPF
Ketones, ur: NEGATIVE
Leukocyte Esterase: NEGATIVE
Nitrites, Initial: NEGATIVE
Protein, ur: NEGATIVE
RBC / HPF: NONE SEEN /HPF (ref 0–2)
Specific Gravity, Urine: 1.015 (ref 1.001–1.03)
WBC, UA: NONE SEEN /HPF (ref 0–5)
pH: 6 (ref 5.0–8.0)

## 2019-03-10 LAB — NO CULTURE INDICATED

## 2019-03-10 LAB — WET PREP FOR TRICH, YEAST, CLUE

## 2019-03-10 MED ORDER — TINIDAZOLE 500 MG PO TABS: 500.0000 mg | ORAL_TABLET | Freq: Two times a day (BID) | ORAL | 0 refills | Status: AC

## 2019-03-10 NOTE — Progress Notes (Signed)
   Brooke Howell  09-13-1990 [id number]  HPI The patient is a 29 y.o. G4P0030 who presents today for recurrence of vaginal discharge and odor.  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  She apparently does not tolerate oral Flagyl and does not like using MetroGel.  She usually uses Tindamax.   Her symptoms have improved somewhat by today but she still wants to get checked out.  Not currently on contraception, not sexually active.  She occasionally gets some abdominal pain during the time of her period can be very intense, none today.   Gets reaction to Flagyl and it makes her very nauseous.  Feels that the boric acid capsules cause irritation.  Past medical history,surgical history, problem list, medications, allergies, family history and social history were all reviewed and documented as reviewed in the EPIC chart.  ROS:  Feeling well. No dyspnea or chest pain on exertion.  No abdominal pain, change in bowel habits, black or bloody stools.  No urinary tract symptoms. GYN ROS: As reviewed in HPI  Physical Exam  BP 118/76   LMP 02/22/2019   General: Pleasant female, no acute distress, alert and oriented PELVIC EXAM: VULVA: normal appearing vulva with no masses, tenderness or lesions, VAGINA: normal appearing vagina with normal color and discharge, no lesions, CERVIX: normal appearing cervix without discharge or lesions, UTERUS: uterus is normal size, shape, consistency and nontender, ADNEXA: normal adnexa in size, nontender and no masses, WET MOUNT done - results: Positive for clue cells, negative for hyphae and trichomonads, few WBC, moderate bacteria, 13-20 epithelial cells/hpf  Assessment 29 yo G4P0030 presenting with recurrence of bacterial vaginosis   Plan Encouraged her to start OCPs when finishing her next menstrual period as this may help with abdominal pains and also regulate hormonal balance to lessen recurrence of BV due to vaginal pH  fluctuation. Recommended probiotics once or twice a day in the form of specially branded yogurts and/or tablets.  Recommended trying to avoid use of soaps and the bottom if feeling irritation.  Urinalysis pending. Tinidazole 500 mg BID x 7 days. Does not tolerate PO Flagyl, does not feel like MetroGel helps her enough.  Theresia Majors MD 03/10/19

## 2019-03-10 NOTE — Patient Instructions (Signed)
Bacterial Vaginosis  Bacterial vaginosis is a vaginal infection that occurs when the normal balance of bacteria in the vagina is disrupted. It results from an overgrowth of certain bacteria. This is the most common vaginal infection among women ages 15-44. Because bacterial vaginosis increases your risk for STIs (sexually transmitted infections), getting treated can help reduce your risk for chlamydia, gonorrhea, herpes, and HIV (human immunodeficiency virus). Treatment is also important for preventing complications in pregnant women, because this condition can cause an early (premature) delivery. What are the causes? This condition is caused by an increase in harmful bacteria that are normally present in small amounts in the vagina. However, the reason that the condition develops is not fully understood. What increases the risk? The following factors may make you more likely to develop this condition:  Having a new sexual partner or multiple sexual partners.  Having unprotected sex.  Douching.  Having an intrauterine device (IUD).  Smoking.  Drug and alcohol abuse.  Taking certain antibiotic medicines.  Being pregnant. You cannot get bacterial vaginosis from toilet seats, bedding, swimming pools, or contact with objects around you. What are the signs or symptoms? Symptoms of this condition include:  Grey or white vaginal discharge. The discharge can also be watery or foamy.  A fish-like odor with discharge, especially after sexual intercourse or during menstruation.  Itching in and around the vagina.  Burning or pain with urination. Some women with bacterial vaginosis have no signs or symptoms. How is this diagnosed? This condition is diagnosed based on:  Your medical history.  A physical exam of the vagina.  Testing a sample of vaginal fluid under a microscope to look for a large amount of bad bacteria or abnormal cells. Your health care provider may use a cotton swab or  a small wooden spatula to collect the sample. How is this treated? This condition is treated with antibiotics. These may be given as a pill, a vaginal cream, or a medicine that is put into the vagina (suppository). If the condition comes back after treatment, a second round of antibiotics may be needed. Follow these instructions at home: Medicines  Take over-the-counter and prescription medicines only as told by your health care provider.  Take or use your antibiotic as told by your health care provider. Do not stop taking or using the antibiotic even if you start to feel better. General instructions  If you have a female sexual partner, tell her that you have a vaginal infection. She should see her health care provider and be treated if she has symptoms. If you have a female sexual partner, he does not need treatment.  During treatment: ? Avoid sexual activity until you finish treatment. ? Do not douche. ? Avoid alcohol as directed by your health care provider. ? Avoid breastfeeding as directed by your health care provider.  Drink enough water and fluids to keep your urine clear or pale yellow.  Keep the area around your vagina and rectum clean. ? Wash the area daily with warm water. ? Wipe yourself from front to back after using the toilet.  Keep all follow-up visits as told by your health care provider. This is important. How is this prevented?  Do not douche.  Wash the outside of your vagina with warm water only.  Use protection when having sex. This includes latex condoms and dental dams.  Limit how many sexual partners you have. To help prevent bacterial vaginosis, it is best to have sex with just one partner (  monogamous).  Make sure you and your sexual partner are tested for STIs.  Wear cotton or cotton-lined underwear.  Avoid wearing tight pants and pantyhose, especially during summer.  Limit the amount of alcohol that you drink.  Do not use any products that contain  nicotine or tobacco, such as cigarettes and e-cigarettes. If you need help quitting, ask your health care provider.  Do not use illegal drugs. Where to find more information  Centers for Disease Control and Prevention: www.cdc.gov/std  American Sexual Health Association (ASHA): www.ashastd.org  U.S. Department of Health and Human Services, Office on Women's Health: www.womenshealth.gov/ or https://www.womenshealth.gov/a-z-topics/bacterial-vaginosis Contact a health care provider if:  Your symptoms do not improve, even after treatment.  You have more discharge or pain when urinating.  You have a fever.  You have pain in your abdomen.  You have pain during sex.  You have vaginal bleeding between periods. Summary  Bacterial vaginosis is a vaginal infection that occurs when the normal balance of bacteria in the vagina is disrupted.  Because bacterial vaginosis increases your risk for STIs (sexually transmitted infections), getting treated can help reduce your risk for chlamydia, gonorrhea, herpes, and HIV (human immunodeficiency virus). Treatment is also important for preventing complications in pregnant women, because the condition can cause an early (premature) delivery.  This condition is treated with antibiotic medicines. These may be given as a pill, a vaginal cream, or a medicine that is put into the vagina (suppository). This information is not intended to replace advice given to you by your health care provider. Make sure you discuss any questions you have with your health care provider. Document Revised: 12/11/2016 Document Reviewed: 09/14/2015 Elsevier Patient Education  2020 Elsevier Inc.  

## 2019-04-03 ENCOUNTER — Encounter: Payer: Self-pay | Admitting: Women's Health

## 2019-04-03 ENCOUNTER — Telehealth: Payer: Self-pay | Admitting: *Deleted

## 2019-04-03 ENCOUNTER — Ambulatory Visit (INDEPENDENT_AMBULATORY_CARE_PROVIDER_SITE_OTHER): Payer: BC Managed Care – PPO | Admitting: Women's Health

## 2019-04-03 ENCOUNTER — Other Ambulatory Visit: Payer: Self-pay

## 2019-04-03 VITALS — BP 122/80

## 2019-04-03 DIAGNOSIS — R350 Frequency of micturition: Secondary | ICD-10-CM | POA: Diagnosis not present

## 2019-04-03 DIAGNOSIS — N912 Amenorrhea, unspecified: Secondary | ICD-10-CM

## 2019-04-03 DIAGNOSIS — B9689 Other specified bacterial agents as the cause of diseases classified elsewhere: Secondary | ICD-10-CM | POA: Diagnosis not present

## 2019-04-03 DIAGNOSIS — Z113 Encounter for screening for infections with a predominantly sexual mode of transmission: Secondary | ICD-10-CM | POA: Diagnosis not present

## 2019-04-03 DIAGNOSIS — N760 Acute vaginitis: Secondary | ICD-10-CM

## 2019-04-03 LAB — PREGNANCY, URINE: Preg Test, Ur: NEGATIVE

## 2019-04-03 LAB — WET PREP FOR TRICH, YEAST, CLUE

## 2019-04-03 MED ORDER — METRONIDAZOLE 0.75 % VA GEL: VAGINAL | 4 refills | Status: DC

## 2019-04-03 NOTE — Telephone Encounter (Signed)
Patient was seen today and prescribed metrogel 0.75 % ,patient arrived at pharmacy and Rx is $200. Patient asked if another medication can be sent? Please advise

## 2019-04-03 NOTE — Progress Notes (Signed)
29 year old S BF G4, P0 presents with complaint of increased vaginal discharge with odor, history of recurrent BV and questions what she can do to help prevent recurrence.  States does not tolerate Flagyl makes her nauseous, was treated with 7 days of Tindamax but symptoms shortly returned after completing.  New partner.  Monthly cycle on Loestrin 1/20 states does have trouble remembering to take daily and last cycle 2 days and lighter than usual.  Denies urinary symptoms, abdominal/back pain or fever.  No known medical problems.  Exam: Appears well.  No CVAT.  External genitalia within normal limits, speculum exam scant white adherent discharge noted, wet prep positive for clues, TNTC bacteria.  Bimanual no CMT or adnexal tenderness.  GC/Chlamydia culture pending. UA: Negative leukocytes, no WBCs, no RBCs, few bacteria,  UPT negative.  Recurrent bacterial vaginosis STD screen  Plan: Options reviewed we will try MetroGel vaginal cream 1 applicator at bedtime x5 nights and then 1 applicator weekly to help prevent recurrence, boric acid gelcap suppression therapy reviewed states did not have relief with that in the past, encouraged to try again..  Instructed to call if no relief of symptoms.  Condoms encouraged until permanent partner.  Continue the Loestrin 1/20.  GC/Chlamydia , HIV B, RPR pending.

## 2019-04-04 LAB — C. TRACHOMATIS/N. GONORRHOEAE RNA
C. trachomatis RNA, TMA: NOT DETECTED
N. gonorrhoeae RNA, TMA: NOT DETECTED

## 2019-04-04 LAB — FLUORESCENT TREPONEMAL AB(FTA)-IGG-BLD: Fluorescent Treponemal ABS: REACTIVE — AB

## 2019-04-04 LAB — RPR: RPR Ser Ql: REACTIVE — AB

## 2019-04-04 LAB — RPR TITER: RPR Titer: 1:2 {titer} — ABNORMAL HIGH

## 2019-04-04 LAB — HIV ANTIBODY (ROUTINE TESTING W REFLEX): HIV 1&2 Ab, 4th Generation: NONREACTIVE

## 2019-04-04 MED ORDER — TINIDAZOLE 500 MG PO TABS: 500.0000 mg | ORAL_TABLET | Freq: Two times a day (BID) | ORAL | 0 refills | Status: DC

## 2019-04-04 NOTE — Telephone Encounter (Signed)
Please tell [place] we are sorry none aware that insurance does not cover.  Tindamax 500 mg twice daily for 7 days and then after completing a Tindamax start  boric acid 600 mg to place per vagina 2-3 times weekly get on [employer] for 30 gelcaps for $10 history of recurrent BV

## 2019-04-04 NOTE — Telephone Encounter (Signed)
Patient informed with all the below.  

## 2019-04-05 LAB — URINALYSIS, COMPLETE W/RFL CULTURE
Bilirubin Urine: NEGATIVE
Glucose, UA: NEGATIVE
Hgb urine dipstick: NEGATIVE
Ketones, ur: NEGATIVE
Leukocyte Esterase: NEGATIVE
Nitrites, Initial: NEGATIVE
Protein, ur: NEGATIVE
RBC / HPF: NONE SEEN /HPF (ref 0–2)
Specific Gravity, Urine: 1.02 (ref 1.001–1.03)
WBC, UA: NONE SEEN /HPF (ref 0–5)
pH: 7 (ref 5.0–8.0)

## 2019-04-05 LAB — URINE CULTURE
MICRO NUMBER:: 10276711
Result:: NO GROWTH
SPECIMEN QUALITY:: ADEQUATE

## 2019-04-05 LAB — CULTURE INDICATED

## 2019-05-01 ENCOUNTER — Encounter: Payer: Self-pay | Admitting: Women's Health

## 2019-05-01 ENCOUNTER — Other Ambulatory Visit: Payer: Self-pay

## 2019-05-01 ENCOUNTER — Ambulatory Visit (INDEPENDENT_AMBULATORY_CARE_PROVIDER_SITE_OTHER): Payer: BC Managed Care – PPO | Admitting: Women's Health

## 2019-05-01 VITALS — BP 120/78

## 2019-05-01 DIAGNOSIS — Z3201 Encounter for pregnancy test, result positive: Secondary | ICD-10-CM | POA: Diagnosis not present

## 2019-05-01 DIAGNOSIS — N898 Other specified noninflammatory disorders of vagina: Secondary | ICD-10-CM

## 2019-05-01 DIAGNOSIS — N912 Amenorrhea, unspecified: Secondary | ICD-10-CM | POA: Diagnosis not present

## 2019-05-01 LAB — WET PREP FOR TRICH, YEAST, CLUE

## 2019-05-01 LAB — PREGNANCY, URINE: Preg Test, Ur: POSITIVE — AB

## 2019-05-01 NOTE — Progress Notes (Signed)
29 year old SBF G4 P0 presents with home positive UPT.  LMP 03/17/2019, 1-1/2 days of spotting on 03/26/2019.  Same partner with negative STD screen 04/03/2019.  Treated for BV 04/03/2019 and states still has some discharge.  Denies urinary symptoms, abdominal pain/cramping, or fever.  Reports no smoking, aware of hazards of smoking and pregnancy.  On a multivitamin daily.  No medical problems, history of positive RPR, low titers, treated 03/2016 low titer since.  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  Exam: Appears well.  UPT positive.  No CVAT.  Abdomen soft, nontender, external genitalia within normal limits, speculum exam moderate amount of white creamy discharge noted wet prep positive for clues, TNTC bacteria.  Bimanual no CMT or adnexal tenderness.  Uterus small  Early pregnancy Bacterial vaginosis  Plan: Schedule viability/dating ultrasound, congratulations given, safe pregnancy behaviors discussed.  MetroGel vaginal cream at bedtime for 5 nights reports works better than the pills states cannot tolerate.

## 2019-05-04 ENCOUNTER — Ambulatory Visit: Payer: BC Managed Care – PPO | Admitting: Nurse Practitioner

## 2019-05-09 ENCOUNTER — Other Ambulatory Visit: Payer: Self-pay

## 2019-05-10 ENCOUNTER — Encounter: Payer: Self-pay | Admitting: Nurse Practitioner

## 2019-05-10 ENCOUNTER — Ambulatory Visit (INDEPENDENT_AMBULATORY_CARE_PROVIDER_SITE_OTHER): Payer: BC Managed Care – PPO

## 2019-05-10 ENCOUNTER — Ambulatory Visit (INDEPENDENT_AMBULATORY_CARE_PROVIDER_SITE_OTHER): Payer: BC Managed Care – PPO | Admitting: Nurse Practitioner

## 2019-05-10 DIAGNOSIS — O3680X1 Pregnancy with inconclusive fetal viability, fetus 1: Secondary | ICD-10-CM

## 2019-05-10 DIAGNOSIS — N912 Amenorrhea, unspecified: Secondary | ICD-10-CM | POA: Diagnosis not present

## 2019-05-10 DIAGNOSIS — Z3A01 Less than 8 weeks gestation of pregnancy: Secondary | ICD-10-CM

## 2019-05-10 DIAGNOSIS — Z3491 Encounter for supervision of normal pregnancy, unspecified, first trimester: Secondary | ICD-10-CM

## 2019-05-10 NOTE — Patient Instructions (Signed)
First Trimester of Pregnancy  The first trimester of pregnancy is from week 1 until the end of week 13 (months 1 through 3). During this time, your baby will begin to develop inside you. At 6-8 weeks, the eyes and face are formed, and the heartbeat can be seen on ultrasound. At the end of 12 weeks, all the baby's organs are formed. Prenatal care is all the medical care you receive before the birth of your baby. Make sure you get good prenatal care and follow all of your doctor's instructions. Follow these instructions at home: Medicines  Take over-the-counter and prescription medicines only as told by your doctor. Some medicines are safe and some medicines are not safe during pregnancy.  Take a prenatal vitamin that contains at least 600 micrograms (mcg) of folic acid.  If you have trouble pooping (constipation), take medicine that will make your stool soft (stool softener) if your doctor approves. Eating and drinking   Eat regular, healthy meals.  Your doctor will tell you the amount of weight gain that is right for you.  Avoid raw meat and uncooked cheese.  If you feel sick to your stomach (nauseous) or throw up (vomit): ? Eat 4 or 5 small meals a day instead of 3 large meals. ? Try eating a few soda crackers. ? Drink liquids between meals instead of during meals.  To prevent constipation: ? Eat foods that are high in fiber, like fresh fruits and vegetables, whole grains, and beans. ? Drink enough fluids to keep your pee (urine) clear or pale yellow. Activity  Exercise only as told by your doctor. Stop exercising if you have cramps or pain in your lower belly (abdomen) or low back.  Do not exercise if it is too hot, too humid, or if you are in a place of great height (high altitude).  Try to avoid standing for long periods of time. Move your legs often if you must stand in one place for a long time.  Avoid heavy lifting.  Wear low-heeled shoes. Sit and stand up straight.   You can have sex unless your doctor tells you not to. Relieving pain and discomfort  Wear a good support bra if your breasts are sore.  Take warm water baths (sitz baths) to soothe pain or discomfort caused by hemorrhoids. Use hemorrhoid cream if your doctor says it is okay.  Rest with your legs raised if you have leg cramps or low back pain.  If you have puffy, bulging veins (varicose veins) in your legs: ? Wear support hose or compression stockings as told by your doctor. ? Raise (elevate) your feet for 15 minutes, 3-4 times a day. ? Limit salt in your food. Prenatal care  Schedule your prenatal visits by the twelfth week of pregnancy.  Write down your questions. Take them to your prenatal visits.  Keep all your prenatal visits as told by your doctor. This is important. Safety  Wear your seat belt at all times when driving.  Make a list of emergency phone numbers. The list should include numbers for family, friends, the hospital, and police and fire departments. General instructions  Ask your doctor for a referral to a local prenatal class. Begin classes no later than at the start of month 6 of your pregnancy.  Ask for help if you need counseling or if you need help with nutrition. Your doctor can give you advice or tell you where to go for help.  Do not use hot tubs, steam   rooms, or saunas.  Do not douche or use tampons or scented sanitary pads.  Do not cross your legs for long periods of time.  Avoid all herbs and alcohol. Avoid drugs that are not approved by your doctor.  Do not use any tobacco products, including cigarettes, chewing tobacco, and electronic cigarettes. If you need help quitting, ask your doctor. You may get counseling or other support to help you quit.  Avoid cat litter boxes and soil used by cats. These carry germs that can cause birth defects in the baby and can cause a loss of your baby (miscarriage) or stillbirth.  Visit your dentist. At home,  brush your teeth with a soft toothbrush. Be gentle when you floss. Contact a doctor if:  You are dizzy.  You have mild cramps or pressure in your lower belly.  You have a nagging pain in your belly area.  You continue to feel sick to your stomach, you throw up, or you have watery poop (diarrhea).  You have a bad smelling fluid coming from your vagina.  You have pain when you pee (urinate).  You have increased puffiness (swelling) in your face, hands, legs, or ankles. Get help right away if:  You have a fever.  You are leaking fluid from your vagina.  You have spotting or bleeding from your vagina.  You have very bad belly cramping or pain.  You gain or lose weight rapidly.  You throw up blood. It may look like coffee grounds.  You are around people who have [place] measles, fifth disease, or chickenpox.  You have a very bad headache.  You have shortness of breath.  You have any kind of trauma, such as from a fall or a car accident. Summary  The first trimester of pregnancy is from week 1 until the end of week 13 (months 1 through 3).  To take care of yourself and your unborn baby, you will need to eat healthy meals, take medicines only if your doctor tells you to do so, and do activities that are safe for you and your baby.  Keep all follow-up visits as told by your doctor. This is important as your doctor will have to ensure that your baby is healthy and growing well. This information is not intended to replace advice given to you by your health care provider. Make sure you discuss any questions you have with your health care provider. Document Revised: 04/21/2018 Document Reviewed: 01/07/2016 Elsevier Patient Education  2020 [employer]. Prenatal Care Prenatal care is health care during pregnancy. It helps you and your unborn baby (fetus) stay as healthy as possible. Prenatal care may be provided by a midwife, a family practice health care provider, or a childbirth  and pregnancy specialist (obstetrician). How does this affect me? During pregnancy, you will be closely monitored for any new conditions that might develop. To lower your risk of pregnancy complications, you and your health care provider will talk about any underlying conditions you have. How does this affect my baby? Early and consistent prenatal care increases the chance that your baby will be healthy during pregnancy. Prenatal care lowers the risk that your baby will be:  Born early (prematurely).  Smaller than expected at birth (small for gestational age). What can I expect at the first prenatal care visit? Your first prenatal care visit will likely be the longest. You should schedule your first prenatal care visit as soon as you know that you are pregnant. Your first visit is  a good time to talk about any questions or concerns you have about pregnancy. At your visit, you and your health care provider will talk about:  Your medical history, including: ? Any past pregnancies. ? Your family's medical history. ? The baby's father's medical history. ? Any long-term (chronic) health conditions you have and how you manage them. ? Any surgeries or procedures you have had. ? Any current over-the-counter or prescription medicines, herbs, or supplements you are taking.  Other factors that could pose a risk to your baby, including:  Your home setting and your stress levels, including: ? Exposure to abuse or violence. ? Household financial strain. ? Mental health conditions you have.  Your daily health habits, including diet and exercise. Your health care provider will also:  Measure your weight, height, and blood pressure.  Do a physical exam, including a pelvic and breast exam.  Perform blood tests and urine tests to check for: ? Urinary tract infection. ? Sexually transmitted infections (STIs). ? Low iron levels in your blood (anemia). ? Blood type and certain proteins on red blood  cells (Rh antibodies). ? Infections and immunity to viruses, such as hepatitis B and rubella. ? HIV (human immunodeficiency virus).  Do an ultrasound to confirm your baby's growth and development and to help predict your estimated due date (EDD). This ultrasound is done with a probe that is inserted into the vagina (transvaginal ultrasound).  Discuss your options for genetic screening.  Give you information about how to keep yourself and your baby healthy, including: ? Nutrition and taking vitamins. ? Physical activity. ? How to manage pregnancy symptoms such as nausea and vomiting (morning sickness). ? Infections and substances that may be harmful to your baby and how to avoid them. ? Food safety. ? Dental care. ? Working. ? Travel. ? Warning signs to watch for and when to call your health care provider. How often will I have prenatal care visits? After your first prenatal care visit, you will have regular visits throughout your pregnancy. The visit schedule is often as follows:  Up to week 28 of pregnancy: once every 4 weeks.  28-36 weeks: once every 2 weeks.  After 36 weeks: every week until delivery. Some women may have visits more or less often depending on any underlying health conditions and the health of the baby. Keep all follow-up and prenatal care visits as told by your health care provider. This is important. What happens during routine prenatal care visits? Your health care provider will:  Measure your weight and blood pressure.  Check for fetal heart sounds.  Measure the height of your uterus in your abdomen (fundal height). This may be measured starting around week 20 of pregnancy.  Check the position of your baby inside your uterus.  Ask questions about your diet, sleeping patterns, and whether you can feel the baby move.  Review warning signs to watch for and signs of labor.  Ask about any pregnancy symptoms you are having and how you are dealing with them.  Symptoms may include: ? Headaches. ? Nausea and vomiting. ? Vaginal discharge. ? Swelling. ? Fatigue. ? Constipation. ? Any discomfort, including back or pelvic pain. Make a list of questions to ask your health care provider at your routine visits. What tests might I have during prenatal care visits? You may have blood, urine, and imaging tests throughout your pregnancy, such as:  Urine tests to check for glucose, protein, or signs of infection.  Glucose tests to check for  a form of diabetes that can develop during pregnancy (gestational diabetes mellitus). This is usually done around week 24 of pregnancy.  An ultrasound to check your baby's growth and development and to check for birth defects. This is usually done around week 20 of pregnancy.  A test to check for group B strep (GBS) infection. This is usually done around week 36 of pregnancy.  Genetic testing. This may include blood or imaging tests, such as an ultrasound. Some genetic tests are done during the first trimester and some are done during the second trimester. What else can I expect during prenatal care visits? Your health care provider may recommend getting certain vaccines during pregnancy. These may include:  A yearly flu shot (annual influenza vaccine). This is especially important if you will be pregnant during flu season.  Tdap (tetanus, diphtheria, pertussis) vaccine. Getting this vaccine during pregnancy can protect your baby from whooping cough (pertussis) after birth. This vaccine may be recommended between weeks 27 and 36 of pregnancy. Later in your pregnancy, your health care provider may give you information about:  Childbirth and breastfeeding classes.  Choosing a health care provider for your baby.  Umbilical cord banking.  Breastfeeding.  Birth control after your baby is born.  The hospital labor and delivery unit and how to tour it.  Registering at the hospital before you go into labor. Where  to find more information  Office on Women's Health: TravelLesson.ca  American Pregnancy Association: americanpregnancy.org  March of Dimes: marchofdimes.org Summary  Prenatal care helps you and your baby stay as healthy as possible during pregnancy.  Your first prenatal care visit will most likely be the longest.  You will have visits and tests throughout your pregnancy to monitor your health and your baby's health.  Bring a list of questions to your visits to ask your health care provider.  Make sure to keep all follow-up and prenatal care visits with your health care provider. This information is not intended to replace advice given to you by your health care provider. Make sure you discuss any questions you have with your health care provider. Document Revised: 04/20/2018 Document Reviewed: 12/28/2016 Elsevier Patient Education  2020 [employer].

## 2019-05-10 NOTE — Progress Notes (Signed)
History: 28 y.o SBF G4P0030 presents for follow up of viability ultrasound. LMP 03/17/2019, in office UPT + 05/01/2018.  History of spontaneous abortion 09/2018. Denies vaginal spotting, discharge, abdominal pain/cramping, or fever. 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 Treated for bacterial vaginosis 05/01/2018, MetroGel vaginal cream used at bedtime for 5 nights reports this has helped, cannot tolerate PO flagyl.  Recently started taking prenatal Gummies. Same sexual partner since recent STD screen 04/03/2019.  Reports quitting smoking recently but partner still smokes. 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  Happy with pregnancy.  Ultrasound: Anteverted uterus, single viable IUP, CRL C/W 6 weeks 2 days - EDD 01/01/2020.  FHR equals 122 B/MIN - WNL for gest age.  Normal yolk sac.  Cervix long and closed.  Adnexa WNL -resolving 12 mm corpus luteum on left side.  No free fluid.  Exam: Appears well, anxious about sustaining pregnancy  Assessment: Early pregnancy-[redacted]w[redacted]d, EDD 01/01/2020  Plan: Discussed ultrasound with patient and partner and provided them with copy of ultrasound. Written and verbal education provided on what to expect during first trimester, importance of prenatal care, and importance of avoiding risky behaviors such as drinking, smoking, heavy lifting, and certain medications.  Has appointment scheduled with OB 05/18/2019.  Instructed to call office if she experiences any bleeding or abdominal pain.

## 2019-05-17 ENCOUNTER — Telehealth: Payer: Self-pay | Admitting: *Deleted

## 2019-05-17 ENCOUNTER — Telehealth: Payer: Self-pay

## 2019-05-17 NOTE — Telephone Encounter (Signed)
Called patient to check on her following ED visit. Minimal bleeding at this time, no pain. Has virtual OB appt tomorrow at 2:30. Instructed her to change this to a face-to-face appointment and to inform them of her situation. Patient agreed with plan.

## 2019-05-17 NOTE — Telephone Encounter (Addendum)
Patient called and left message in voicemail was seen today ER at [facility] health for possible miscarriage (notes in chart) .Patient called back and spoke with her and she said no miscarriage, but she still asked if you would review [facility] notes and let her know what you would like her to do next?  Please advise

## 2019-05-18 ENCOUNTER — Other Ambulatory Visit: Payer: Self-pay

## 2019-05-18 ENCOUNTER — Ambulatory Visit (INDEPENDENT_AMBULATORY_CARE_PROVIDER_SITE_OTHER): Payer: Medicaid Other | Admitting: *Deleted

## 2019-05-18 ENCOUNTER — Encounter: Payer: Self-pay | Admitting: *Deleted

## 2019-05-18 DIAGNOSIS — O99211 Obesity complicating pregnancy, first trimester: Secondary | ICD-10-CM

## 2019-05-18 DIAGNOSIS — O99210 Obesity complicating pregnancy, unspecified trimester: Secondary | ICD-10-CM

## 2019-05-18 DIAGNOSIS — Z3A01 Less than 8 weeks gestation of pregnancy: Secondary | ICD-10-CM

## 2019-05-18 DIAGNOSIS — E669 Obesity, unspecified: Secondary | ICD-10-CM

## 2019-05-18 DIAGNOSIS — O0990 Supervision of high risk pregnancy, unspecified, unspecified trimester: Secondary | ICD-10-CM | POA: Insufficient documentation

## 2019-05-18 DIAGNOSIS — O209 Hemorrhage in early pregnancy, unspecified: Secondary | ICD-10-CM | POA: Insufficient documentation

## 2019-05-18 DIAGNOSIS — O09899 Supervision of other high risk pregnancies, unspecified trimester: Secondary | ICD-10-CM

## 2019-05-18 DIAGNOSIS — O09891 Supervision of other high risk pregnancies, first trimester: Secondary | ICD-10-CM

## 2019-05-18 DIAGNOSIS — O0991 Supervision of high risk pregnancy, unspecified, first trimester: Secondary | ICD-10-CM

## 2019-05-18 NOTE — Patient Instructions (Signed)

## 2019-05-18 NOTE — Progress Notes (Signed)
I connected with  [name] on 05/18/19 at  2:30 PM EDT by telephone and verified that I am speaking with the correct person using two identifiers.   I discussed the limitations, risks, security and privacy concerns of performing an evaluation and management service by telephone and the availability of in person appointments. I also discussed with the patient that there may be a patient responsible charge related to this service. The patient expressed understanding and agreed to proceed.  I explained I am completing her New OB Intake today. We discussed Her EDD and that it is based on early [place].She reports and I verified in chart she has had visit with [facility] yesterday for vaginal bleeding. She reports she had blood saturating her underwear and pants . They verified her pregnancy with US showing  7 week baby with heartrate. She states her bleeding had stopped by the time they did her exam and only had some dark spotting after ultrasound and at home. Reviewed with her we will verify FHR at her new ob visit. Instructed her to go to the hospital if she has period like bleeding or severe pain.   I reviewed her allergies, meds, OB History, Medical /Surgical history, and appropriate screenings. I informed her of [facility] services.   I explained I will send her the Bab[employer] app and app was sent to her while on phone.  I explained we will  Ask her to purchase a blood pressure cuff to check her blood pressure weekly during her pregnancy since her insurance does not cover a RX.  I asked her to bring the blood pressure cuff with her to her first ob appointment if she has any questions.  Explained  then we will have her take her blood pressure weekly and enter into the app.  I explained she will have some visits in office and some virtually. She already has MyChart account and I assisted her with downloading the app. I reviewed her new ob  appointment date/ time with her , our location and to wear mask, no visitors.  I  explained she will have a pelvic exam, ob bloodwork, hemoglobin a1C, cbg , and  genetic testing if desired,- she does want a panorama. I scheduled an [place] at 19 weeks and gave her the appointment. She voices understanding. She asked about pregnancy restrictions for work. I sent her pregnancy restriction letter via MyChart.  Lillar Bianca,RN 05/18/2019  2:30pm

## 2019-05-20 NOTE — Telephone Encounter (Signed)
Late Entry for 05/17/19 @ 1700- Pt called and stated that she has a NEW OB intake appt scheduled and the [facility] provider requested that the visit becomes face to face due to her being seen by them for vaginal bleeding in pregnancy.  I verified with pt if she is currently having vaginal bleeding.  Pt stated no, not at this time.  Pt informed me that she had went to [facility] because she was bleeding, they did an U/S, lab work, and everything.  They also told her to call our office to make her virtual appt a face to face.  Per care everywhere, U/S showed viable pregnancy.  Reviewed pt's concern with Dr. Jolayne Panther who recommended that because pt is stable, pt can continue to have her NEW OB intake appt virtually and to potentially schedule her NEW OB appt with provider sooner if available.    Addison Naegeli, RN

## 2019-05-23 ENCOUNTER — Other Ambulatory Visit: Payer: Self-pay

## 2019-05-24 ENCOUNTER — Ambulatory Visit (INDEPENDENT_AMBULATORY_CARE_PROVIDER_SITE_OTHER): Payer: Medicaid Other | Admitting: Obstetrics and Gynecology

## 2019-05-24 ENCOUNTER — Encounter: Payer: Self-pay | Admitting: Obstetrics and Gynecology

## 2019-05-24 VITALS — BP 122/76

## 2019-05-24 DIAGNOSIS — O418X10 Other specified disorders of amniotic fluid and membranes, first trimester, not applicable or unspecified: Secondary | ICD-10-CM | POA: Diagnosis not present

## 2019-05-24 DIAGNOSIS — O209 Hemorrhage in early pregnancy, unspecified: Secondary | ICD-10-CM | POA: Diagnosis not present

## 2019-05-24 DIAGNOSIS — Z3A08 8 weeks gestation of pregnancy: Secondary | ICD-10-CM

## 2019-05-24 DIAGNOSIS — N898 Other specified noninflammatory disorders of vagina: Secondary | ICD-10-CM

## 2019-05-24 LAB — WET PREP FOR TRICH, YEAST, CLUE

## 2019-05-24 NOTE — Progress Notes (Signed)
° °  Brooke Howell 1990-04-10 [id number]  SUBJECTIVE:  29 y.o. G4P0030 female presents for evaluation of brown vaginal discharge.  Last week she did have fairly heavy vaginal bleeding and was evaluated at [facility], her ultrasound indicated a single live intrauterine pregnancy with normal cardiac activity and CRL consistent with 7 weeks 2 days gestational age.  There was mention of a small subchorionic hematoma on the ultrasound report.  She has not had any further vaginal bleeding but has had continued brown vaginal discharge only with wiping after using the restroom, in addition to a lot of clearish discharge.  No odor or irritation.  No significant cramping.  She is taking a prenatal vitamin.  Current Outpatient Medications  Medication Sig Dispense Refill   Prenatal MV-Min-FA-Omega-3 (PRENATAL GUMMIES/DHA & FA PO) Take 2 tablets by mouth daily.     No current facility-administered medications for this visit.   Allergies: Latex and Shellfish allergy  Patient's last menstrual period was 03/17/2019 (exact date).  Past medical history,surgical history, problem list, medications, allergies, family history and social history were all reviewed and documented as reviewed in the EPIC chart.  ROS:  Feeling well. No dyspnea or chest pain on exertion.  No abdominal pain, change in bowel habits, black or bloody stools.  No urinary tract symptoms. GYN ROS: see HPI   OBJECTIVE:  BP 122/76    LMP 03/17/2019 (Exact Date)  The patient appears well, alert, oriented x 3, in no distress. PELVIC EXAM: VULVA: normal appearing vulva with no masses, tenderness or lesions, VAGINA: normal appearing vagina with normal color and normal leukorrhea/white discharge, dime size area of dark brown discharge present near the cervix, but no sign of active bleeding, no lesions, CERVIX: normal appearing cervix without discharge or lesions, UTERUS: uterus is normal size, shape, consistency and nontender, WET MOUNT done - results:  negative for pathogens, normal epithelial cells  Chaperone: [name] present during the examination  Bedside transabdominal ultrasound shows a single viable intrauterine pregnancy with FHR 170s bpm, CRL 1.75 cm consistent with 8 weeks 1 day, subchronic hematoma noted without any signs of active bleeding.   ASSESSMENT:  29 y.o. G4P0030 at [redacted]w[redacted]d here for evaluation of vaginal discharge, subchorionic hematoma  PLAN:  Patient is reassured by the vaginal examination today and lack of any active bleeding and demonstrated fetal viability on the bedside ultrasound.  This brownish vaginal discharge is probably remaining from the bleeding that she had last week.  We discussed the increased clear to white vaginal discharge that is normal in pregnancy.  Vaginal wet mount is negative.   She does have a small subchorionic hematoma noted on the ultrasound and also the formal scan that was done last week, and we discussed what this is and the association with risk of miscarriage, but in the absence of any bleeding or symptoms right now she should not be overly concerned.  I recommend that she take it easy and practice pelvic rest for least the next few weeks.  She says she does have prenatal care arranged and has an appointment in just over a week.  She should notify us if in the meantime she is having any recurrent vaginal bleeding and/or any significant cramping.  I did also encourage her to drink at least half a gallon of water per day.  All questions were answered by the end of the visit.   Theresia Majors MD 05/24/19

## 2019-05-26 ENCOUNTER — Ambulatory Visit: Payer: BC Managed Care – PPO | Admitting: Obstetrics and Gynecology

## 2019-06-01 ENCOUNTER — Encounter: Payer: Self-pay | Admitting: Family Medicine

## 2019-06-01 ENCOUNTER — Other Ambulatory Visit: Payer: Self-pay

## 2019-06-01 ENCOUNTER — Ambulatory Visit (INDEPENDENT_AMBULATORY_CARE_PROVIDER_SITE_OTHER): Payer: Medicaid Other | Admitting: Family Medicine

## 2019-06-01 VITALS — BP 113/68 | HR 79 | Wt 222.6 lb

## 2019-06-01 DIAGNOSIS — O09899 Supervision of other high risk pregnancies, unspecified trimester: Secondary | ICD-10-CM

## 2019-06-01 DIAGNOSIS — E669 Obesity, unspecified: Secondary | ICD-10-CM

## 2019-06-01 DIAGNOSIS — O99211 Obesity complicating pregnancy, first trimester: Secondary | ICD-10-CM

## 2019-06-01 DIAGNOSIS — O99210 Obesity complicating pregnancy, unspecified trimester: Secondary | ICD-10-CM

## 2019-06-01 DIAGNOSIS — O0990 Supervision of high risk pregnancy, unspecified, unspecified trimester: Secondary | ICD-10-CM

## 2019-06-01 DIAGNOSIS — Z3A09 9 weeks gestation of pregnancy: Secondary | ICD-10-CM

## 2019-06-01 DIAGNOSIS — O209 Hemorrhage in early pregnancy, unspecified: Secondary | ICD-10-CM

## 2019-06-01 MED ORDER — BLOOD PRESSURE KIT DEVI: 1.0000 [IU] | 0 refills | Status: DC

## 2019-06-01 NOTE — Patient Instructions (Signed)
  [email or web address]
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
## 2019-06-02 LAB — RPR, QUANT+TP ABS (REFLEX)
Rapid Plasma Reagin, Quant: 1:2 {titer} — ABNORMAL HIGH
T Pallidum Abs: REACTIVE — AB

## 2019-06-02 LAB — OBSTETRIC PANEL, INCLUDING HIV
Antibody Screen: NEGATIVE
Basophils Absolute: 0 10*3/uL (ref 0.0–0.2)
Basos: 0 %
EOS (ABSOLUTE): 0.1 10*3/uL (ref 0.0–0.4)
Eos: 1 %
HIV Screen 4th Generation wRfx: NONREACTIVE
Hematocrit: 32.7 % — ABNORMAL LOW (ref 34.0–46.6)
Hemoglobin: 10.9 g/dL — ABNORMAL LOW (ref 11.1–15.9)
Hepatitis B Surface Ag: NEGATIVE
Immature Grans (Abs): 0 10*3/uL (ref 0.0–0.1)
Immature Granulocytes: 0 %
Lymphocytes Absolute: 2.2 10*3/uL (ref 0.7–3.1)
Lymphs: 34 %
MCH: 30.4 pg (ref 26.6–33.0)
MCHC: 33.3 g/dL (ref 31.5–35.7)
MCV: 91 fL (ref 79–97)
Monocytes Absolute: 0.5 10*3/uL (ref 0.1–0.9)
Monocytes: 7 %
Neutrophils Absolute: 3.9 10*3/uL (ref 1.4–7.0)
Neutrophils: 58 %
Platelets: 274 10*3/uL (ref 150–450)
RBC: 3.58 x10E6/uL — ABNORMAL LOW (ref 3.77–5.28)
RDW: 12.1 % (ref 11.7–15.4)
RPR Ser Ql: REACTIVE — AB
Rh Factor: POSITIVE
Rubella Antibodies, IGG: 7.92 index (ref >0.99)
WBC: 6.7 10*3/uL (ref 3.4–10.8)

## 2019-06-02 LAB — HEMOGLOBIN A1C
Est. average glucose Bld gHb Est-mCnc: 105 mg/dL
Hgb A1c MFr Bld: 5.3 % (ref 4.8–5.6)

## 2019-06-02 MED ORDER — ASPIRIN EC 81 MG PO TBEC: 81.0000 mg | DELAYED_RELEASE_TABLET | Freq: Every day | ORAL | 2 refills | Status: DC

## 2019-06-06 LAB — CULTURE, OB URINE

## 2019-06-13 ENCOUNTER — Telehealth: Payer: Self-pay | Admitting: *Deleted

## 2019-06-13 NOTE — Telephone Encounter (Signed)
Pt left VM message stating that she wants to know the results of what she is having. Per chart review, message on pink sticky note states that pt does not want to receive info of gender over the phone - wants to pick up the results. Also, pt was only [redacted]w[redacted]d on date of Panorama.

## 2019-06-14 ENCOUNTER — Encounter: Payer: Self-pay | Admitting: *Deleted

## 2019-06-19 ENCOUNTER — Ambulatory Visit: Payer: Medicaid Other | Admitting: Nurse Practitioner

## 2019-06-19 ENCOUNTER — Other Ambulatory Visit: Payer: Self-pay

## 2019-06-19 MED ORDER — TERCONAZOLE 0.4 % VA CREA
1.0000 | TOPICAL_CREAM | Freq: Every day | VAGINAL | 0 refills | Status: DC
Start: 2019-06-19 — End: 2019-08-03

## 2019-06-19 NOTE — Telephone Encounter (Signed)
Pt returned call and she informed me that she was having severe itching in her vaginal area.  I advised pt that I can e-prescribed her Terazol cream to see if she gets relief as it is common to have yeast infections in pregnancy.    Addison Naegeli, RN  06/19/19

## 2019-06-19 NOTE — Addendum Note (Signed)
Addended by: Faythe Casa on: 06/19/2019 04:32 PM   Modules accepted: Orders

## 2019-06-19 NOTE — Telephone Encounter (Signed)
L/M that I am returning her call if she continues to have questions and concerns to please give the office a call.    Addison Naegeli, RN

## 2019-06-23 ENCOUNTER — Other Ambulatory Visit: Payer: Self-pay

## 2019-06-23 ENCOUNTER — Encounter (HOSPITAL_COMMUNITY): Payer: Self-pay | Admitting: Obstetrics and Gynecology

## 2019-06-23 ENCOUNTER — Inpatient Hospital Stay (HOSPITAL_COMMUNITY)
Admission: AD | Admit: 2019-06-23 | Discharge: 2019-06-23 | Disposition: A | Discharge status: Home / Self Care | Payer: Medicaid Other | Attending: Obstetrics and Gynecology | Admitting: Obstetrics and Gynecology

## 2019-06-23 DIAGNOSIS — Z3A12 12 weeks gestation of pregnancy: Secondary | ICD-10-CM | POA: Diagnosis not present

## 2019-06-23 DIAGNOSIS — Z87891 Personal history of nicotine dependence: Secondary | ICD-10-CM | POA: Insufficient documentation

## 2019-06-23 DIAGNOSIS — Z7982 Long term (current) use of aspirin: Secondary | ICD-10-CM | POA: Diagnosis not present

## 2019-06-23 DIAGNOSIS — Z79899 Other long term (current) drug therapy: Secondary | ICD-10-CM | POA: Diagnosis not present

## 2019-06-23 DIAGNOSIS — K529 Noninfective gastroenteritis and colitis, unspecified: Secondary | ICD-10-CM | POA: Insufficient documentation

## 2019-06-23 DIAGNOSIS — O26891 Other specified pregnancy related conditions, first trimester: Secondary | ICD-10-CM | POA: Insufficient documentation

## 2019-06-23 DIAGNOSIS — O99611 Diseases of the digestive system complicating pregnancy, first trimester: Secondary | ICD-10-CM | POA: Diagnosis not present

## 2019-06-23 LAB — BASIC METABOLIC PANEL
Anion gap: 8 (ref 5–15)
BUN: 9 mg/dL (ref 6–20)
CO2: 21 mmol/L — ABNORMAL LOW (ref 22–32)
Calcium: 9.2 mg/dL (ref 8.9–10.3)
Chloride: 105 mmol/L (ref 98–111)
Creatinine, Ser: 0.93 mg/dL (ref 0.44–1.00)
GFR calc Af Amer: >60 mL/min (ref >60)
GFR calc non Af Amer: >60 mL/min (ref >60)
Glucose, Bld: 95 mg/dL (ref 70–99)
Potassium: 3.7 mmol/L (ref 3.5–5.1)
Sodium: 134 mmol/L — ABNORMAL LOW (ref 135–145)

## 2019-06-23 LAB — URINALYSIS, ROUTINE W REFLEX MICROSCOPIC
Bilirubin Urine: NEGATIVE
Glucose, UA: NEGATIVE mg/dL
Hgb urine dipstick: NEGATIVE
Ketones, ur: NEGATIVE mg/dL
Leukocytes,Ua: NEGATIVE
Nitrite: NEGATIVE
Protein, ur: NEGATIVE mg/dL
Specific Gravity, Urine: 1.023 (ref 1.005–1.030)
pH: 7 (ref 5.0–8.0)

## 2019-06-23 LAB — CBC
HCT: 35.2 % — ABNORMAL LOW (ref 36.0–46.0)
Hemoglobin: 11.7 g/dL — ABNORMAL LOW (ref 12.0–15.0)
MCH: 30.2 pg (ref 26.0–34.0)
MCHC: 33.2 g/dL (ref 30.0–36.0)
MCV: 90.7 fL (ref 80.0–100.0)
Platelets: 273 10*3/uL (ref 150–400)
RBC: 3.88 MIL/uL (ref 3.87–5.11)
RDW: 12.2 % (ref 11.5–15.5)
WBC: 6.5 10*3/uL (ref 4.0–10.5)
nRBC: 0 % (ref 0.0–0.2)

## 2019-06-23 MED ORDER — METOCLOPRAMIDE HCL 5 MG/ML IJ SOLN
10.0000 mg | Freq: Once | INTRAMUSCULAR | Status: AC
  Administered 2019-06-23: 10 mg via INTRAVENOUS
  Filled 2019-06-23: qty 2

## 2019-06-23 MED ORDER — METOCLOPRAMIDE HCL 10 MG PO TABS
10.0000 mg | ORAL_TABLET | Freq: Four times a day (QID) | ORAL | 0 refills | Status: DC | PRN
Start: 2019-06-23 — End: 2019-10-03

## 2019-06-23 MED ORDER — LACTATED RINGERS IV BOLUS
1000.0000 mL | Freq: Once | INTRAVENOUS | Status: AC
  Administered 2019-06-23: 1000 mL via INTRAVENOUS

## 2019-06-23 MED ORDER — ONDANSETRON HCL 4 MG/2ML IJ SOLN
4.0000 mg | Freq: Once | INTRAMUSCULAR | Status: AC
  Administered 2019-06-23: 4 mg via INTRAVENOUS
  Filled 2019-06-23: qty 2

## 2019-06-23 NOTE — MAU Note (Signed)
.   Brooke Howell is a 29 y.o. at [redacted]w[redacted]d here in MAU reporting: lower abdominal and pelvic pain. PT states she has had nausea with the pregnancy but states she started vomiting last night and it is much worse. Denies any VB  Onset of complaint: last night Pain score: 4 Vitals:   06/23/19 1322  BP: 135/72  Pulse: (!) 105  Resp: 16  Temp: 98.6 F (37 C)  SpO2: 99%     FHT: Lab orders placed from triage: [facility]

## 2019-06-23 NOTE — Discharge Instructions (Signed)
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
## 2019-07-04 ENCOUNTER — Other Ambulatory Visit: Payer: Self-pay

## 2019-07-04 ENCOUNTER — Other Ambulatory Visit (HOSPITAL_COMMUNITY)
Admission: RE | Admit: 2019-07-04 | Discharge: 2019-07-04 | Disposition: A | Discharge status: Home / Self Care | Payer: Medicaid Other | Source: Ambulatory Visit | Attending: Medical | Admitting: Medical

## 2019-07-04 ENCOUNTER — Ambulatory Visit (INDEPENDENT_AMBULATORY_CARE_PROVIDER_SITE_OTHER): Payer: Medicaid Other | Admitting: Women's Health

## 2019-07-04 VITALS — BP 121/73 | HR 89

## 2019-07-04 DIAGNOSIS — O0991 Supervision of high risk pregnancy, unspecified, first trimester: Secondary | ICD-10-CM

## 2019-07-04 DIAGNOSIS — E668 Other obesity: Secondary | ICD-10-CM

## 2019-07-04 DIAGNOSIS — R110 Nausea: Secondary | ICD-10-CM

## 2019-07-04 DIAGNOSIS — O99212 Obesity complicating pregnancy, second trimester: Secondary | ICD-10-CM

## 2019-07-04 DIAGNOSIS — O26892 Other specified pregnancy related conditions, second trimester: Secondary | ICD-10-CM

## 2019-07-04 DIAGNOSIS — O98112 Syphilis complicating pregnancy, second trimester: Secondary | ICD-10-CM

## 2019-07-04 DIAGNOSIS — O99210 Obesity complicating pregnancy, unspecified trimester: Secondary | ICD-10-CM

## 2019-07-04 DIAGNOSIS — N898 Other specified noninflammatory disorders of vagina: Secondary | ICD-10-CM | POA: Diagnosis present

## 2019-07-04 DIAGNOSIS — O09892 Supervision of other high risk pregnancies, second trimester: Secondary | ICD-10-CM

## 2019-07-04 DIAGNOSIS — O219 Vomiting of pregnancy, unspecified: Secondary | ICD-10-CM

## 2019-07-04 DIAGNOSIS — O09899 Supervision of other high risk pregnancies, unspecified trimester: Secondary | ICD-10-CM

## 2019-07-04 DIAGNOSIS — O0990 Supervision of high risk pregnancy, unspecified, unspecified trimester: Secondary | ICD-10-CM

## 2019-07-04 MED ORDER — ONDANSETRON HCL 4 MG PO TABS: 4.0000 mg | ORAL_TABLET | Freq: Three times a day (TID) | ORAL | 1 refills | Status: DC | PRN

## 2019-07-04 MED ORDER — FAMOTIDINE 20 MG PO TABS: 20.0000 mg | ORAL_TABLET | Freq: Every day | ORAL | 1 refills | Status: DC

## 2019-07-04 NOTE — Patient Instructions (Addendum)
Maternity Assessment Unit (MAU)  The Maternity Assessment Unit (MAU) is located at the [facility] at [facility]. The address is: [address] [place], [place], [place] [address]. Please see map below for additional directions.    The Maternity Assessment Unit is designed to help you during your pregnancy, and for up to 6 weeks after delivery, with any pregnancy- or postpartum-related emergencies, if you think you are in labor, or if your water has broken. For example, if you experience nausea and vomiting, vaginal bleeding, severe abdominal or pelvic pain, elevated blood pressure or other problems related to your pregnancy or postpartum time, please come to the Maternity Assessment Unit for assistance.       Morning Sickness  Morning sickness is when a woman feels nauseous during pregnancy. This nauseous feeling may or may not come with vomiting. It often occurs in the morning, but it can be a problem at any time of day. Morning sickness is most common during the first trimester. In some cases, it may continue throughout pregnancy. Although morning sickness is unpleasant, it is usually harmless unless the woman develops severe and continual vomiting (hyperemesis gravidarum), a condition that requires more intense treatment. What are the causes? The exact cause of this condition is not known, but it seems to be related to normal hormonal changes that occur in pregnancy. What increases the risk? You are more likely to develop this condition if:  You experienced nausea or vomiting before your pregnancy.  You had morning sickness during a previous pregnancy.  You are pregnant with more than one baby, such as twins. What are the signs or symptoms? Symptoms of this condition include:  Nausea.  Vomiting. How is this diagnosed? This condition is usually diagnosed based on your signs and symptoms. How is this treated? In many cases, treatment is not  needed for this condition. Making some changes to what you eat may help to control symptoms. Your health care provider may also prescribe or recommend:  Vitamin B6 supplements.  Anti-nausea medicines.  Ginger. Follow these instructions at home: Medicines  Take over-the-counter and prescription medicines only as told by your health care provider. Do not use any prescription, over-the-counter, or herbal medicines for morning sickness without first talking with your health care provider.  Taking multivitamins before getting pregnant can prevent or decrease the severity of morning sickness in most women. Eating and drinking  Eat a piece of dry toast or crackers before getting out of bed in the morning.  Eat 5 or 6 small meals a day.  Eat dry and bland foods, such as rice or a baked potato. Foods that are high in carbohydrates are often helpful.  Avoid greasy, fatty, and spicy foods.  Have someone cook for you if the smell of any food causes nausea and vomiting.  If you feel nauseous after taking prenatal vitamins, take the vitamins at night or with a snack.  Snack on protein foods between meals if you are hungry. Nuts, yogurt, and cheese are good options.  Drink fluids throughout the day.  Try ginger ale made with real ginger, ginger tea made from fresh grated ginger, or ginger candies. General instructions  Do not use any products that contain nicotine or tobacco, such as cigarettes and e-cigarettes. If you need help quitting, ask your health care provider.  Get an air purifier to keep the air in your house free of odors.  Get plenty of fresh air.  Try to avoid odors  that trigger your nausea.  Consider trying these methods to help relieve symptoms: ? Wearing an acupressure wristband. These wristbands are often worn for seasickness. ? Acupuncture. Contact a health care provider if:  Your home remedies are not working and you need medicine.  You feel dizzy or  light-headed.  You are losing weight. Get help right away if:  You have persistent and uncontrolled nausea and vomiting.  You faint.  You have severe pain in your abdomen. Summary  Morning sickness is when a woman feels nauseous during pregnancy. This nauseous feeling may or may not come with vomiting.  Morning sickness is most common during the first trimester.  It often occurs in the morning, but it can be a problem at any time of day.  In many cases, treatment is not needed for this condition. Making some changes to what you eat may help to control symptoms. This information is not intended to replace advice given to you by your health care provider. Make sure you discuss any questions you have with your health care provider. Document Revised: 12/11/2016 Document Reviewed: 02/01/2016 Elsevier Patient Education  2020 [employer].        Hyperemesis Gravidarum Hyperemesis gravidarum is a severe form of nausea and vomiting that happens during pregnancy. Hyperemesis is worse than morning sickness. It may cause you to have nausea or vomiting all day for many days. It may keep you from eating and drinking enough food and liquids, which can lead to dehydration, malnutrition, and weight loss. Hyperemesis usually occurs during the first half (the first 20 weeks) of pregnancy. It often goes away once a woman is in her second half of pregnancy. However, sometimes hyperemesis continues through an entire pregnancy. What are the causes? The cause of this condition is not known. It may be related to changes in chemicals (hormones) in the body during pregnancy, such as the high level of pregnancy hormone (human chorionic gonadotropin) or the increase in the female sex hormone (estrogen). What are the signs or symptoms? Symptoms of this condition include:  Nausea that does not go away.  Vomiting that does not allow you to keep any food down.  Weight loss.  Body fluid loss  (dehydration).  Having no desire to eat, or not liking food that you have previously enjoyed. How is this diagnosed? This condition may be diagnosed based on:  A physical exam.  Your medical history.  Your symptoms.  Blood tests.  Urine tests. How is this treated? This condition is managed by controlling symptoms. This may include:  Following an eating plan. This can help lessen nausea and vomiting.  Taking prescription medicines. An eating plan and medicines are often used together to help control symptoms. If medicines do not help relieve nausea and vomiting, you may need to receive fluids through an IV at the hospital. Follow these instructions at home: Eating and drinking   Avoid the following: ? Drinking fluids with meals. Try not to drink anything during the 30 minutes before and after your meals. ? Drinking more than 1 cup of fluid at a time. ? Eating foods that trigger your symptoms. These may include spicy foods, coffee, high-fat foods, very sweet foods, and acidic foods. ? Skipping meals. Nausea can be more intense on an empty stomach. If you cannot tolerate food, do not force it. Try sucking on ice chips or other frozen items and make up for missed calories later. ? Lying down within 2 hours after eating. ? Being exposed to  environmental triggers. These may include food smells, smoky rooms, closed spaces, rooms with strong smells, warm or humid places, overly loud and noisy rooms, and rooms with motion or flickering lights. Try eating meals in a well-ventilated area that is free of strong smells. ? Quick and sudden changes in your movement. ? Taking iron pills and multivitamins that contain iron. If you take prescription iron pills, do not stop taking them unless your health care provider approves. ? Preparing food. The smell of food can spoil your appetite or trigger nausea.  To help relieve your symptoms: ? Listen to your body. Everyone is different and has  different preferences. Find what works best for you. ? Eat and drink slowly. ? Eat 5-6 small meals daily instead of 3 large meals. Eating small meals and snacks can help you avoid an empty stomach. ? In the morning, before getting out of bed, eat a couple of crackers to avoid moving around on an empty stomach. ? Try eating starchy foods as these are usually tolerated well. Examples include cereal, toast, bread, potatoes, pasta, rice, and pretzels. ? Include at least 1 serving of protein with your meals and snacks. Protein options include lean meats, poultry, seafood, beans, nuts, nut butters, eggs, cheese, and yogurt. ? Try eating a protein-rich snack before bed. Examples of a protein-rick snack include cheese and crackers or a peanut butter sandwich made with 1 slice of whole-wheat bread and 1 tsp (5 g) of peanut butter. ? Eat or suck on things that have ginger in them. It may help relieve nausea. Add  tsp ground ginger to hot tea or choose ginger tea. ? Try drinking 100% fruit juice or an electrolyte drink. An electrolyte drink contains sodium, potassium, and chloride. ? Drink fluids that are cold, clear, and carbonated or sour. Examples include lemonade, ginger ale, lemon-lime soda, ice water, and sparkling water. ? Brush your teeth or use a mouth rinse after meals. ? Talk with your health care provider about starting a supplement of vitamin B6. General instructions  Take over-the-counter and prescription medicines only as told by your health care provider.  Follow instructions from your health care provider about eating or drinking restrictions.  Continue to take your prenatal vitamins as told by your health care provider. If you are having trouble taking your prenatal vitamins, talk with your health care provider about different options.  Keep all follow-up and pre-birth (prenatal) visits as told by your health care provider. This is important. Contact a health care provider if:  You  have pain in your abdomen.  You have a severe headache.  You have vision problems.  You are losing weight.  You feel weak or dizzy. Get help right away if:  You cannot drink fluids without vomiting.  You vomit blood.  You have constant nausea and vomiting.  You are very weak.  You faint.  You have a fever and your symptoms suddenly get worse. Summary  Hyperemesis gravidarum is a severe form of nausea and vomiting that happens during pregnancy.  Making some changes to your eating habits may help relieve nausea and vomiting.  This condition may be managed with medicine.  If medicines do not help relieve nausea and vomiting, you may need to receive fluids through an IV at the hospital. This information is not intended to replace advice given to you by your health care provider. Make sure you discuss any questions you have with your health care provider. Document Revised: 01/18/2017 Document Reviewed: 08/28/2015 Elsevier  Patient Education  [employer].            Second Trimester of Pregnancy The second trimester is from week 14 through week 27 (months 4 through 6). The second trimester is often a time when you feel your best. Your body has adjusted to being pregnant, and you begin to feel better physically. Usually, morning sickness has lessened or quit completely, you may have more energy, and you may have an increase in appetite. The second trimester is also a time when the fetus is growing rapidly. At the end of the sixth month, the fetus is about 9 inches long and weighs about 1 pounds. You will likely begin to feel the baby move (quickening) between 16 and 20 weeks of pregnancy. Body changes during your second trimester Your body continues to go through many changes during your second trimester. The changes vary from woman to woman.  Your weight will continue to increase. You will notice your lower abdomen bulging out.  You may begin to get stretch marks  on your hips, abdomen, and breasts.  You may develop headaches that can be relieved by medicines. The medicines should be approved by your health care provider.  You may urinate more often because the fetus is pressing on your bladder.  You may develop or continue to have heartburn as a result of your pregnancy.  You may develop constipation because certain hormones are causing the muscles that push waste through your intestines to slow down.  You may develop hemorrhoids or swollen, bulging veins (varicose veins).  You may have back pain. This is caused by: ? Weight gain. ? Pregnancy hormones that are relaxing the joints in your pelvis. ? A shift in weight and the muscles that support your balance.  Your breasts will continue to grow and they will continue to become tender.  Your gums may bleed and may be sensitive to brushing and flossing.  Dark spots or blotches (chloasma, mask of pregnancy) may develop on your face. This will likely fade after the baby is born.  A dark line from your belly button to the pubic area (linea nigra) may appear. This will likely fade after the baby is born.  You may have changes in your hair. These can include thickening of your hair, rapid growth, and changes in texture. Some women also have hair loss during or after pregnancy, or hair that feels dry or thin. Your hair will most likely return to normal after your baby is born. What to expect at prenatal visits During a routine prenatal visit:  You will be weighed to make sure you and the fetus are growing normally.  Your blood pressure will be taken.  Your abdomen will be measured to track your baby's growth.  The fetal heartbeat will be listened to.  Any test results from the previous visit will be discussed. Your health care provider may ask you:  How you are feeling.  If you are feeling the baby move.  If you have had any abnormal symptoms, such as leaking fluid, bleeding, severe  headaches, or abdominal cramping.  If you are using any tobacco products, including cigarettes, chewing tobacco, and electronic cigarettes.  If you have any questions. Other tests that may be performed during your second trimester include:  Blood tests that check for: ? Low iron levels (anemia). ? High blood sugar that affects pregnant women (gestational diabetes) between 29 and 28 weeks. ? Rh antibodies. This is to check for a protein  on red blood cells (Rh factor).  Urine tests to check for infections, diabetes, or protein in the urine.  An ultrasound to confirm the proper growth and development of the baby.  An amniocentesis to check for possible genetic problems.  Fetal screens for spina bifida and Down syndrome.  HIV (human immunodeficiency virus) testing. Routine prenatal testing includes screening for HIV, unless you choose not to have this test. Follow these instructions at home: Medicines  Follow your health care provider's instructions regarding medicine use. Specific medicines may be either safe or unsafe to take during pregnancy.  Take a prenatal vitamin that contains at least 600 micrograms (mcg) of folic acid.  If you develop constipation, try taking a stool softener if your health care provider approves. Eating and drinking   Eat a balanced diet that includes fresh fruits and vegetables, whole grains, good sources of protein such as meat, eggs, or tofu, and low-fat dairy. Your health care provider will help you determine the amount of weight gain that is right for you.  Avoid raw meat and uncooked cheese. These carry germs that can cause birth defects in the baby.  If you have low calcium intake from food, talk to your health care provider about whether you should take a daily calcium supplement.  Limit foods that are high in fat and processed sugars, such as fried and sweet foods.  To prevent constipation: ? Drink enough fluid to keep your urine clear or pale  yellow. ? Eat foods that are high in fiber, such as fresh fruits and vegetables, whole grains, and beans. Activity  Exercise only as directed by your health care provider. Most women can continue their usual exercise routine during pregnancy. Try to exercise for 30 minutes at least 5 days a week. Stop exercising if you experience uterine contractions.  Avoid heavy lifting, wear low heel shoes, and practice good posture.  A sexual relationship may be continued unless your health care provider directs you otherwise. Relieving pain and discomfort  Wear a good support bra to prevent discomfort from breast tenderness.  Take warm sitz baths to soothe any pain or discomfort caused by hemorrhoids. Use hemorrhoid cream if your health care provider approves.  Rest with your legs elevated if you have leg cramps or low back pain.  If you develop varicose veins, wear support hose. Elevate your feet for 15 minutes, 3-4 times a day. Limit salt in your diet. Prenatal Care  Write down your questions. Take them to your prenatal visits.  Keep all your prenatal visits as told by your health care provider. This is important. Safety  Wear your seat belt at all times when driving.  Make a list of emergency phone numbers, including numbers for family, friends, the hospital, and police and fire departments. General instructions  Ask your health care provider for a referral to a local prenatal education class. Begin classes no later than the beginning of month 6 of your pregnancy.  Ask for help if you have counseling or nutritional needs during pregnancy. Your health care provider can offer advice or refer you to specialists for help with various needs.  Do not use hot tubs, steam rooms, or saunas.  Do not douche or use tampons or scented sanitary pads.  Do not cross your legs for long periods of time.  Avoid cat litter boxes and soil used by cats. These carry germs that can cause birth defects in the  baby and possibly loss of the fetus by miscarriage or  stillbirth.  Avoid all smoking, herbs, alcohol, and unprescribed drugs. Chemicals in these products can affect the formation and growth of the baby.  Do not use any products that contain nicotine or tobacco, such as cigarettes and e-cigarettes. If you need help quitting, ask your health care provider.  Visit your dentist if you have not gone yet during your pregnancy. Use a soft toothbrush to brush your teeth and be gentle when you floss. Contact a health care provider if:  You have dizziness.  You have mild pelvic cramps, pelvic pressure, or nagging pain in the abdominal area.  You have persistent nausea, vomiting, or diarrhea.  You have a bad smelling vaginal discharge.  You have pain when you urinate. Get help right away if:  You have a fever.  You are leaking fluid from your vagina.  You have spotting or bleeding from your vagina.  You have severe abdominal cramping or pain.  You have rapid weight gain or weight loss.  You have shortness of breath with chest pain.  You notice sudden or extreme swelling of your face, hands, ankles, feet, or legs.  You have not felt your baby move in over an hour.  You have severe headaches that do not go away when you take medicine.  You have vision changes. Summary  The second trimester is from week 14 through week 27 (months 4 through 6). It is also a time when the fetus is growing rapidly.  Your body goes through many changes during pregnancy. The changes vary from woman to woman.  Avoid all smoking, herbs, alcohol, and unprescribed drugs. These chemicals affect the formation and growth your baby.  Do not use any tobacco products, such as cigarettes, chewing tobacco, and e-cigarettes. If you need help quitting, ask your health care provider.  Contact your health care provider if you have any questions. Keep all prenatal visits as told by your health care provider. This is  important. This information is not intended to replace advice given to you by your health care provider. Make sure you discuss any questions you have with your health care provider. Document Revised: 04/22/2018 Document Reviewed: 02/04/2016 Elsevier Patient Education  2020 [employer]          Round Ligament Pain  The round ligament is a cord of muscle and tissue that helps support the uterus. It can become a source of pain during pregnancy if it becomes stretched or twisted as the baby grows. The pain usually begins in the second trimester (13-28 weeks) of pregnancy, and it can come and go until the baby is delivered. It is not a serious problem, and it does not cause harm to the baby. Round ligament pain is usually a short, sharp, and pinching pain, but it can also be a dull, lingering, and aching pain. The pain is felt in the lower side of the abdomen or in the groin. It usually starts deep in the groin and moves up to the outside of the hip area. The pain may occur when you:  Suddenly change position, such as quickly going from a sitting to standing position.  Roll over in bed.  Cough or sneeze.  Do physical activity. Follow these instructions at home:   Watch your condition for any changes.  When the pain starts, relax. Then try any of these methods to help with the pain: ? Sitting down. ? Flexing your knees up to your abdomen. ? Lying on your side with one pillow under your abdomen  and another pillow between your legs. ? Sitting in a warm bath for 15-20 minutes or until the pain goes away.  Take over-the-counter and prescription medicines only as told by your health care provider.  Move slowly when you sit down or stand up.  Avoid long walks if they cause pain.  Stop or reduce your physical activities if they cause pain.  Keep all follow-up visits as told by your health care provider. This is important. Contact a health care provider if:  Your pain does not go  away with treatment.  You feel pain in your back that you did not have before.  Your medicine is not helping. Get help right away if:  You have a fever or chills.  You develop uterine contractions.  You have vaginal bleeding.  You have nausea or vomiting.  You have diarrhea.  You have pain when you urinate. Summary  Round ligament pain is felt in the lower abdomen or groin. It is usually a short, sharp, and pinching pain. It can also be a dull, lingering, and aching pain.  This pain usually begins in the second trimester (13-28 weeks). It occurs because the uterus is stretching with the growing baby, and it is not harmful to the baby.  You may notice the pain when you suddenly change position, when you cough or sneeze, or during physical activity.  Relaxing, flexing your knees to your abdomen, lying on one side, or taking a warm bath may help to get rid of the pain.  Get help from your health care provider if the pain does not go away or if you have vaginal bleeding, nausea, vomiting, diarrhea, or painful urination. This information is not intended to replace advice given to you by your health care provider. Make sure you discuss any questions you have with your health care provider. Document Revised: 06/16/2017 Document Reviewed: 06/16/2017 Elsevier Patient Education  2020 [employer].

## 2019-07-04 NOTE — Progress Notes (Signed)
Subjective:  Brooke Howell is a 29 y.o. G4P0030 at [redacted]w[redacted]d being seen today for ongoing prenatal care.  She is currently monitored for the following issues for this high-risk pregnancy and has STD (sexually transmitted disease); ASCUS with positive high risk HPV; Positive RPR test; Supervision of high risk pregnancy, antepartum; Vaginal bleeding in pregnancy, first trimester; Obesity affecting pregnancy, antepartum; Short interval between pregnancies affecting pregnancy, antepartum; and History of maternal syphilis, currently pregnant on their problem list.  Patient reports nausea and vomiting.  Contractions: Not present. Vag. Bleeding: None.  Movement: Absent. Denies leaking of fluid.   The following portions of the patient's history were reviewed and updated as appropriate: allergies, current medications, past family history, past medical history, past social history, past surgical history and problem list. Problem list updated.  Objective:   Vitals:   07/04/19 1542  BP: 121/73  Pulse: 89    Fetal Status: Fetal Heart Rate (bpm): 158   Movement: Absent     General:  Alert, oriented and cooperative. Patient is in no acute distress.  Skin: Skin is warm and dry. No rash noted.   Cardiovascular: Normal heart rate noted  Respiratory: Normal respiratory effort, no problems with respiration noted  Abdomen: Soft, gravid, appropriate for gestational age. Pain/Pressure: Present     Pelvic: Vag. Bleeding: None Vag D/C Character: White   Cervical exam deferred        Extremities: Normal range of motion.  Edema: None  Mental Status: Normal mood and affect. Normal behavior. Normal judgment and thought content.   Urinalysis:      Assessment and Plan:  Pregnancy: G4P0030 at [redacted]w[redacted]d  1. Supervision of high risk pregnancy, antepartum -needs Hepatitis C, done today -AFP next visit -urine culture today as initial OB order was cancelled -anatomy scan scheduled for 08/10/2019, pt aware  2. Obesity  affecting pregnancy, antepartum -pt not taking ASA daily, discussed importance of starting for preeclampsia prevention, pt to pick up at pharmacy and start ASAP  3. History of maternal syphilis, currently pregnant -reactive RPR, titers 1:2, no treatment at this time  4. Nausea and vomiting in pregnancy -pt endorses vomiting 2-3 times per day -pt denies smoking marijuana -pt has Reglan at this time, but reports at hospital combination of Zofran and Reglan worked well -RX Zofran -RX Pepcid -pt advised to take medications around the clock and not to stop taking if feeling better -discussed nonpharmacologic and pharmacologic treatments of N/V -discussed normal expectations for N/V in pregnancy  5. Vaginal discharge during pregnancy in second trimester - Cervicovaginal ancillary only( [facility])   Preterm labor symptoms and general obstetric precautions including but not limited to vaginal bleeding, contractions, leaking of fluid and fetal movement were reviewed in detail with the patient. Please refer to After Visit Summary for other counseling recommendations.  Return in about 4 weeks (around 08/01/2019) for in-person LOB/APP OK/AFP.   Xavi Tomasik, Odie Sera, NP

## 2019-07-04 NOTE — Progress Notes (Signed)
Pt states is experiencing a lot of pain in lower abdomen. Pt was in MAU on 06/23/19 Emesis, was given Rx Reglan but doesn't work alone. Pt also states back is hurting a lot. Pt also clld prev of symptoms of an yeast infection, was Rx Vaginal cream but still having symptoms & wants to be checked for BV & Yeast.

## 2019-07-05 LAB — HEPATITIS C ANTIBODY: Hep C Virus Ab: <0.1 s/co ratio (ref 0.0–0.9)

## 2019-07-06 LAB — CERVICOVAGINAL ANCILLARY ONLY
Bacterial Vaginitis (gardnerella): NEGATIVE
Candida Glabrata: NEGATIVE
Candida Vaginitis: NEGATIVE
Comment: NEGATIVE
Comment: NEGATIVE
Comment: NEGATIVE

## 2019-07-26 ENCOUNTER — Telehealth: Payer: Self-pay | Admitting: Family Medicine

## 2019-07-26 NOTE — Telephone Encounter (Addendum)
Called pt and discussed her concerns. She stated that she feels she has a bacterial infection which started one week after her previous visit on 6/22. At that visit wet prep was performed which produced negative results. She reports having thick white vaginal discharge with an odor. She further stated that when she has had these sx in the past, it is a bacterial infection and she knows that is what she has. Pt was offered treatment for BV due to her history however she stated she "didn't just want to take medication without knowing what the problem is". Pt also reports that she has back pain and is concerned for having a bladder infection. Pt was given nurse appt tomorrow @ 1100 for self swab and urinalysis. She agreed and voiced understanding.

## 2019-07-26 NOTE — Telephone Encounter (Signed)
Patient called into the office wanting to speak to someone because she has not heard anything back from the office yet and this was about a week ago. Patient stated that she did a self swab on 6/22 and she feels like it was not done correctly because they only did the tip and went around. Patient stated that she is currently looking for another office to transfer to because she is not happy with the care that she is receiving at this office. Patient instructed that I gave her information to a nurse and she will be contacted as soon as the nurse is available. Patient verbalized understanding. Patient's information was given to Diane, RN and she stated she would call the patient back.

## 2019-07-27 ENCOUNTER — Other Ambulatory Visit: Payer: Self-pay

## 2019-07-27 ENCOUNTER — Other Ambulatory Visit (HOSPITAL_COMMUNITY)
Admission: RE | Admit: 2019-07-27 | Discharge: 2019-07-27 | Disposition: A | Discharge status: Home / Self Care | Payer: Medicaid Other | Source: Ambulatory Visit | Attending: Family Medicine | Admitting: Family Medicine

## 2019-07-27 ENCOUNTER — Ambulatory Visit (INDEPENDENT_AMBULATORY_CARE_PROVIDER_SITE_OTHER): Payer: Medicaid Other | Admitting: *Deleted

## 2019-07-27 VITALS — BP 116/64 | Ht 68.0 in | Wt 221.3 lb

## 2019-07-27 DIAGNOSIS — M549 Dorsalgia, unspecified: Secondary | ICD-10-CM | POA: Diagnosis not present

## 2019-07-27 DIAGNOSIS — N898 Other specified noninflammatory disorders of vagina: Secondary | ICD-10-CM | POA: Diagnosis not present

## 2019-07-27 DIAGNOSIS — Z3A17 17 weeks gestation of pregnancy: Secondary | ICD-10-CM

## 2019-07-27 DIAGNOSIS — O99891 Other specified diseases and conditions complicating pregnancy: Secondary | ICD-10-CM | POA: Diagnosis not present

## 2019-07-27 DIAGNOSIS — O26892 Other specified pregnancy related conditions, second trimester: Secondary | ICD-10-CM

## 2019-07-27 LAB — POCT URINALYSIS DIP (DEVICE)
Bilirubin Urine: NEGATIVE
Glucose, UA: NEGATIVE mg/dL
Hgb urine dipstick: NEGATIVE
Ketones, ur: NEGATIVE mg/dL
Leukocytes,Ua: NEGATIVE
Nitrite: NEGATIVE
Protein, ur: NEGATIVE mg/dL
Specific Gravity, Urine: >=1.03 (ref 1.005–1.030)
Urobilinogen, UA: 0.2 mg/dL (ref 0.0–1.0)
pH: 6 (ref 5.0–8.0)

## 2019-07-27 NOTE — Progress Notes (Signed)
Pt seen earlier today for self swab and urinalysis. She is reporting having vaginal discharge and odor for several weeks. She also has back pain and is concerned that she may have a bladder infection.  Self swab obtained. Urinalysis completed and is not indicative of UTI however pt requests urine culture. Pt advised she will be notified of results via MyChart. She voiced understanding.

## 2019-07-28 LAB — CERVICOVAGINAL ANCILLARY ONLY
Bacterial Vaginitis (gardnerella): NEGATIVE
Candida Glabrata: NEGATIVE
Candida Vaginitis: POSITIVE — AB
Chlamydia: NEGATIVE
Comment: NEGATIVE
Comment: NEGATIVE
Comment: NEGATIVE
Comment: NEGATIVE
Comment: NEGATIVE
Comment: NORMAL
Neisseria Gonorrhea: NEGATIVE
Trichomonas: NEGATIVE

## 2019-07-31 LAB — CULTURE, OB URINE

## 2019-07-31 LAB — URINE CULTURE, OB REFLEX

## 2019-08-01 ENCOUNTER — Ambulatory Visit (INDEPENDENT_AMBULATORY_CARE_PROVIDER_SITE_OTHER): Payer: Medicaid Other | Admitting: Student

## 2019-08-01 ENCOUNTER — Other Ambulatory Visit: Payer: Self-pay

## 2019-08-01 VITALS — BP 117/67 | HR 89 | Wt 222.8 lb

## 2019-08-01 DIAGNOSIS — O0992 Supervision of high risk pregnancy, unspecified, second trimester: Secondary | ICD-10-CM

## 2019-08-01 DIAGNOSIS — Z3A18 18 weeks gestation of pregnancy: Secondary | ICD-10-CM

## 2019-08-01 DIAGNOSIS — B9562 Methicillin resistant Staphylococcus aureus infection as the cause of diseases classified elsewhere: Secondary | ICD-10-CM

## 2019-08-01 DIAGNOSIS — O0990 Supervision of high risk pregnancy, unspecified, unspecified trimester: Secondary | ICD-10-CM

## 2019-08-01 DIAGNOSIS — A4902 Methicillin resistant Staphylococcus aureus infection, unspecified site: Secondary | ICD-10-CM

## 2019-08-01 DIAGNOSIS — O98812 Other maternal infectious and parasitic diseases complicating pregnancy, second trimester: Secondary | ICD-10-CM

## 2019-08-01 MED ORDER — SULFAMETHOXAZOLE-TRIMETHOPRIM 800-160 MG PO TABS
1.0000 | ORAL_TABLET | Freq: Two times a day (BID) | ORAL | 0 refills | Status: DC
Start: 2019-08-01 — End: 2019-10-03

## 2019-08-01 NOTE — Progress Notes (Signed)
Patient ID: Brooke Howell, female   DOB: November 22, 1990, 29 y.o.   MRN: [id number]   PRENATAL VISIT NOTE  Subjective:  Brooke Howell is a 29 y.o. G4P0030 at [redacted]w[redacted]d being seen today for ongoing prenatal care.  She is currently monitored for the following issues for this low-risk pregnancy and has STD (sexually transmitted disease); ASCUS with positive high risk HPV; Positive RPR test; Supervision of high risk pregnancy, antepartum; Vaginal bleeding in pregnancy, first trimester; Obesity affecting pregnancy, antepartum; Short interval between pregnancies affecting pregnancy, antepartum; and History of maternal syphilis, currently pregnant on their problem list.  Patient reports no complaints.  Contractions: Not present. Vag. Bleeding: None.  Movement: Present. Denies leaking of fluid.   The following portions of the patient's history were reviewed and updated as appropriate: allergies, current medications, past family history, past medical history, past social history, past surgical history and problem list.   Objective:   Vitals:   08/01/19 1607  BP: 117/67  Pulse: 89  Weight: 222 lb 12.8 oz (101.1 kg)    Fetal Status: Fetal Heart Rate (bpm): 164   Movement: Present     General:  Alert, oriented and cooperative. Patient is in no acute distress.  Skin: Skin is warm and dry. No rash noted.   Cardiovascular: Normal heart rate noted  Respiratory: Normal respiratory effort, no problems with respiration noted  Abdomen: Soft, gravid, appropriate for gestational age.  Pain/Pressure: Present     Pelvic: Cervical exam deferred        Extremities: Normal range of motion.  Edema: None  Mental Status: Normal mood and affect. Normal behavior. Normal judgment and thought content.   Assessment and Plan:  Pregnancy: G4P0030 at [redacted]w[redacted]d 1. Supervision of high risk pregnancy, antepartum Patient declines yeast treatment today, will message [place] if she wants treatment.  -UTI on urine culture; will give RX to  Bactrim safe at 18 weeks; will treat due to limited agents to use  - AFP, Serum, Open Spina Bifida  Preterm labor symptoms and general obstetric precautions including but not limited to vaginal bleeding, contractions, leaking of fluid and fetal movement were reviewed in detail with the patient. Please refer to After Visit Summary for other counseling recommendations.   Return in about 3 weeks (around 08/22/2019), or My chart LROB.  Future Appointments  Date Time Provider Department Center  08/10/2019  8:30 AM [facility] NURSE [facility]  08/10/2019  8:30 AM [facility]  08/22/2019  2:55 PM Crisoforo Oxford, Charlesetta Garibaldi, CNM [facility]    Marylene Land, [place]

## 2019-08-01 NOTE — Progress Notes (Signed)
Pt states pressure in lower abdominal.

## 2019-08-03 ENCOUNTER — Inpatient Hospital Stay (HOSPITAL_COMMUNITY)
Admission: AD | Admit: 2019-08-03 | Discharge: 2019-08-03 | Disposition: A | Discharge status: Home / Self Care | Payer: Medicaid Other | Attending: Obstetrics and Gynecology | Admitting: Obstetrics and Gynecology

## 2019-08-03 ENCOUNTER — Encounter (HOSPITAL_COMMUNITY): Payer: Self-pay | Admitting: Obstetrics and Gynecology

## 2019-08-03 DIAGNOSIS — K5900 Constipation, unspecified: Secondary | ICD-10-CM | POA: Diagnosis not present

## 2019-08-03 DIAGNOSIS — M546 Pain in thoracic spine: Secondary | ICD-10-CM | POA: Diagnosis not present

## 2019-08-03 DIAGNOSIS — M79601 Pain in right arm: Secondary | ICD-10-CM | POA: Insufficient documentation

## 2019-08-03 DIAGNOSIS — O09899 Supervision of other high risk pregnancies, unspecified trimester: Secondary | ICD-10-CM

## 2019-08-03 DIAGNOSIS — Z3A18 18 weeks gestation of pregnancy: Secondary | ICD-10-CM | POA: Diagnosis not present

## 2019-08-03 DIAGNOSIS — M25552 Pain in left hip: Secondary | ICD-10-CM | POA: Diagnosis not present

## 2019-08-03 DIAGNOSIS — Z7982 Long term (current) use of aspirin: Secondary | ICD-10-CM | POA: Insufficient documentation

## 2019-08-03 DIAGNOSIS — M542 Cervicalgia: Secondary | ICD-10-CM | POA: Insufficient documentation

## 2019-08-03 DIAGNOSIS — M79602 Pain in left arm: Secondary | ICD-10-CM | POA: Diagnosis not present

## 2019-08-03 DIAGNOSIS — O209 Hemorrhage in early pregnancy, unspecified: Secondary | ICD-10-CM

## 2019-08-03 DIAGNOSIS — Z87440 Personal history of urinary (tract) infections: Secondary | ICD-10-CM | POA: Insufficient documentation

## 2019-08-03 DIAGNOSIS — O26892 Other specified pregnancy related conditions, second trimester: Secondary | ICD-10-CM | POA: Diagnosis not present

## 2019-08-03 DIAGNOSIS — R112 Nausea with vomiting, unspecified: Secondary | ICD-10-CM | POA: Diagnosis not present

## 2019-08-03 DIAGNOSIS — O99891 Other specified diseases and conditions complicating pregnancy: Secondary | ICD-10-CM | POA: Diagnosis not present

## 2019-08-03 DIAGNOSIS — O99210 Obesity complicating pregnancy, unspecified trimester: Secondary | ICD-10-CM

## 2019-08-03 DIAGNOSIS — O0990 Supervision of high risk pregnancy, unspecified, unspecified trimester: Secondary | ICD-10-CM

## 2019-08-03 DIAGNOSIS — O99612 Diseases of the digestive system complicating pregnancy, second trimester: Secondary | ICD-10-CM

## 2019-08-03 DIAGNOSIS — Z87891 Personal history of nicotine dependence: Secondary | ICD-10-CM | POA: Insufficient documentation

## 2019-08-03 DIAGNOSIS — M25519 Pain in unspecified shoulder: Secondary | ICD-10-CM

## 2019-08-03 HISTORY — DX: Unspecified infection of urinary tract in pregnancy, unspecified trimester: O23.40

## 2019-08-03 LAB — CBC WITH DIFFERENTIAL/PLATELET
Abs Immature Granulocytes: 0.12 10*3/uL — ABNORMAL HIGH (ref 0.00–0.07)
Basophils Absolute: 0 10*3/uL (ref 0.0–0.1)
Basophils Relative: 0 %
Eosinophils Absolute: 0.1 10*3/uL (ref 0.0–0.5)
Eosinophils Relative: 1 %
HCT: 32.8 % — ABNORMAL LOW (ref 36.0–46.0)
Hemoglobin: 10.9 g/dL — ABNORMAL LOW (ref 12.0–15.0)
Immature Granulocytes: 1 %
Lymphocytes Relative: 14 %
Lymphs Abs: 1.3 10*3/uL (ref 0.7–4.0)
MCH: 29.9 pg (ref 26.0–34.0)
MCHC: 33.2 g/dL (ref 30.0–36.0)
MCV: 89.9 fL (ref 80.0–100.0)
Monocytes Absolute: 0.5 10*3/uL (ref 0.1–1.0)
Monocytes Relative: 6 %
Neutro Abs: 7.3 10*3/uL (ref 1.7–7.7)
Neutrophils Relative %: 78 %
Platelets: 288 10*3/uL (ref 150–400)
RBC: 3.65 MIL/uL — ABNORMAL LOW (ref 3.87–5.11)
RDW: 12.5 % (ref 11.5–15.5)
WBC: 9.4 10*3/uL (ref 4.0–10.5)
nRBC: 0 % (ref 0.0–0.2)

## 2019-08-03 LAB — COMPREHENSIVE METABOLIC PANEL
ALT: 26 U/L (ref 0–44)
AST: 20 U/L (ref 15–41)
Albumin: 3.1 g/dL — ABNORMAL LOW (ref 3.5–5.0)
Alkaline Phosphatase: 63 U/L (ref 38–126)
Anion gap: 9 (ref 5–15)
BUN: 10 mg/dL (ref 6–20)
CO2: 23 mmol/L (ref 22–32)
Calcium: 9.4 mg/dL (ref 8.9–10.3)
Chloride: 102 mmol/L (ref 98–111)
Creatinine, Ser: 1 mg/dL (ref 0.44–1.00)
GFR calc Af Amer: >60 mL/min (ref >60)
GFR calc non Af Amer: >60 mL/min (ref >60)
Glucose, Bld: 95 mg/dL (ref 70–99)
Potassium: 3.8 mmol/L (ref 3.5–5.1)
Sodium: 134 mmol/L — ABNORMAL LOW (ref 135–145)
Total Bilirubin: 0.3 mg/dL (ref 0.3–1.2)
Total Protein: 6.7 g/dL (ref 6.5–8.1)

## 2019-08-03 LAB — AFP, SERUM, OPEN SPINA BIFIDA
AFP MoM: 1.56
AFP Value: 48.4 ng/mL
Gest. Age on Collection Date: 18 weeks
Maternal Age At EDD: 31.6 yr
OSBR Risk 1 IN: 1391
Test Results:: NEGATIVE
Weight: 222 [lb_av]

## 2019-08-03 LAB — URINALYSIS, ROUTINE W REFLEX MICROSCOPIC
Bilirubin Urine: NEGATIVE
Glucose, UA: NEGATIVE mg/dL
Hgb urine dipstick: NEGATIVE
Ketones, ur: 5 mg/dL — AB
Leukocytes,Ua: NEGATIVE
Nitrite: NEGATIVE
Protein, ur: NEGATIVE mg/dL
Specific Gravity, Urine: 1.027 (ref 1.005–1.030)
pH: 6 (ref 5.0–8.0)

## 2019-08-03 MED ORDER — IBUPROFEN 600 MG PO TABS
600.0000 mg | ORAL_TABLET | Freq: Once | ORAL | Status: AC
  Administered 2019-08-03: 600 mg via ORAL
  Filled 2019-08-03: qty 1

## 2019-08-03 MED ORDER — CYCLOBENZAPRINE HCL 10 MG PO TABS
10.0000 mg | ORAL_TABLET | Freq: Three times a day (TID) | ORAL | 2 refills | Status: DC | PRN
Start: 2019-08-03 — End: 2019-09-19

## 2019-08-03 MED ORDER — POLYETHYLENE GLYCOL 3350 17 G PO PACK: 17.0000 g | PACK | Freq: Every day | ORAL | 5 refills | Status: DC

## 2019-08-03 MED ORDER — CYCLOBENZAPRINE HCL 5 MG PO TABS
10.0000 mg | ORAL_TABLET | Freq: Once | ORAL | Status: AC
  Administered 2019-08-03: 10 mg via ORAL
  Filled 2019-08-03: qty 2

## 2019-08-03 NOTE — Discharge Instructions (Signed)
Constipation, Adult Constipation is when a person:  Poops (has a bowel movement) fewer times in a week than normal.  Has a hard time pooping.  Has poop that is dry, hard, or bigger than normal. Follow these instructions at home: Eating and drinking   Eat foods that have a lot of fiber, such as: ? Fresh fruits and vegetables. ? Whole grains. ? Beans.  Eat less of foods that are high in fat, low in fiber, or overly processed, such as: ? [place] fries. ? Hamburgers. ? Cookies. ? Candy. ? Soda.  Drink enough fluid to keep your pee (urine) clear or pale yellow. General instructions  Exercise regularly or as told by your doctor.  Go to the restroom when you feel like you need to poop. Do not hold it in.  Take over-the-counter and prescription medicines only as told by your doctor. These include any fiber supplements.  Do pelvic floor retraining exercises, such as: ? Doing deep breathing while relaxing your lower belly (abdomen). ? Relaxing your pelvic floor while pooping.  Watch your condition for any changes.  Keep all follow-up visits as told by your doctor. This is important. Contact a doctor if:  You have pain that gets worse.  You have a fever.  You have not pooped for 4 days.  You throw up (vomit).  You are not hungry.  You lose weight.  You are bleeding from the anus.  You have thin, pencil-like poop (stool). Get help right away if:  You have a fever, and your symptoms suddenly get worse.  You leak poop or have blood in your poop.  Your belly feels hard or bigger than normal (is bloated).  You have very bad belly pain.  You feel dizzy or you faint. This information is not intended to replace advice given to you by your health care provider. Make sure you discuss any questions you have with your health care provider. Document Revised: 12/11/2016 Document Reviewed: 06/19/2015 Elsevier Patient Education  2020 [employer]  Cervical  Radiculopathy  Cervical radiculopathy means that a nerve in the neck (a cervical nerve) is pinched or bruised. This can happen because of an injury to the cervical spine (vertebrae) in the neck, or as a normal part of getting older. This can cause pain or loss of feeling (numbness) that runs from your neck all the way down to your arm and fingers. Often, this condition gets better with rest. Treatment may be needed if the condition does not get better. What are the causes?  A neck injury.  A bulging disk in your spine.  Muscle movements that you cannot control (muscle spasms).  Tight muscles in your neck due to overuse.  Arthritis.  Breakdown in the bones and joints of the spine (spondylosis) due to getting older.  Bone spurs that form near the nerves in the neck. What are the signs or symptoms?  Pain. The pain may: ? Run from the neck to the arm and hand. ? Be very bad or irritating. ? Be worse when you move your neck.  Loss of feeling or tingling in your arm or hand.  Weakness in your arm or hand, in very bad cases. How is this treated? In many cases, treatment is not needed for this condition. With rest, the condition often gets better over time. If treatment is needed, options may include:  Wearing a soft neck collar (cervical collar) for short periods of time, as told by your doctor.  Doing exercises (physical  therapy) to strengthen your neck muscles.  Taking medicines.  Having shots (injections) in your spine, in very bad cases.  Having surgery. This may be needed if other treatments do not help. The type of surgery that is used depends on the cause of your condition. Follow these instructions at home: If you have a soft neck collar:  Wear it as told by your doctor. Remove it only as told by your doctor.  Ask your doctor if you can remove the collar for cleaning and bathing. If you are allowed to remove the collar for cleaning or bathing: ? Follow instructions from  your doctor about how to remove the collar safely. ? Clean the collar by wiping it with mild soap and water and drying it completely. ? Take out any removable pads in the collar every 1-2 days. Wash them by hand with soap and water. Let them air-dry completely before you put them back in the collar. ? Check your skin under the collar for redness or sores. If you see any, tell your doctor. Managing pain      Take over-the-counter and prescription medicines only as told by your doctor.  If told, put ice on the painful area. ? If you have a soft neck collar, remove it as told by your doctor. ? Put ice in a plastic bag. ? Place a towel between your skin and the bag. ? Leave the ice on for 20 minutes, 2-3 times a day.  If using ice does not help, you can try using heat. Use the heat source that your doctor recommends, such as a moist heat pack or a heating pad. ? Place a towel between your skin and the heat source. ? Leave the heat on for 20-30 minutes. ? Remove the heat if your skin turns bright red. This is very important if you are unable to feel pain, heat, or cold. You may have a greater risk of getting burned.  You may try a gentle neck and shoulder rub (massage). Activity  Rest as needed.  Return to your normal activities as told by your doctor. Ask your doctor what activities are safe for you.  Do exercises as told by your doctor or physical therapist.  Do not lift anything that is heavier than 10 lb (4.5 kg) until your doctor tells you that it is safe. General instructions  Use a flat pillow when you sleep.  Do not drive while wearing a soft neck collar. If you do not have a soft neck collar, ask your doctor if it is safe to drive while your neck heals.  Ask your doctor if the medicine prescribed to you requires you to avoid driving or using heavy machinery.  Do not use any products that contain nicotine or tobacco, such as cigarettes, e-cigarettes, and chewing tobacco.  These can delay healing. If you need help quitting, ask your doctor.  Keep all follow-up visits as told by your doctor. This is important. Contact a doctor if:  Your condition does not get better with treatment. Get help right away if:  Your pain gets worse and is not helped with medicine.  You lose feeling or feel weak in your hand, arm, face, or leg.  You have a high fever.  You have a stiff neck.  You cannot control when you poop or pee (have incontinence).  You have trouble with walking, balance, or talking. Summary  Cervical radiculopathy means that a nerve in the neck is pinched or bruised.  A  nerve can get pinched from a bulging disk, arthritis, an injury to the neck, or other causes.  Symptoms include pain, tingling, or loss of feeling that goes from the neck into the arm or hand.  Weakness in your arm or hand can happen in very bad cases.  Treatment may include resting, wearing a soft neck collar, and doing exercises. You might need to take medicines for pain. In very bad cases, shots or surgery may be needed. This information is not intended to replace advice given to you by your health care provider. Make sure you discuss any questions you have with your health care provider. Document Revised: 11/19/2017 Document Reviewed: 11/19/2017 Elsevier Patient Education  2020 [employer].

## 2019-08-03 NOTE — Progress Notes (Signed)
Marie Williams CNM in earlier to discuss test results and d/c plan. Written and verbal d/c instructions given and understanding voiced °

## 2019-08-03 NOTE — MAU Note (Signed)
Pt does not want Percocet currently. She was able to lay back on several pillows and states she is alittle more comfortable and wants to try to relax and see if will help.

## 2019-08-03 NOTE — MAU Note (Addendum)
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 My L hip hurts. The pain is aching. Just started antibiotics today for uti. Denies VB. Told had vag d/c that is consistent with yeast but no symptoms. Vomited 3 times today and n/v had gotten better. Had a lot of n/v during pregnancy until last few wks.
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
## 2019-08-04 ENCOUNTER — Other Ambulatory Visit: Payer: Self-pay | Admitting: General Practice

## 2019-08-04 DIAGNOSIS — M25519 Pain in unspecified shoulder: Secondary | ICD-10-CM

## 2019-08-04 DIAGNOSIS — M25552 Pain in left hip: Secondary | ICD-10-CM

## 2019-08-04 DIAGNOSIS — K5900 Constipation, unspecified: Secondary | ICD-10-CM

## 2019-08-10 ENCOUNTER — Ambulatory Visit: Payer: Medicaid Other | Admitting: *Deleted

## 2019-08-10 ENCOUNTER — Ambulatory Visit: Payer: Medicaid Other | Attending: Obstetrics and Gynecology

## 2019-08-10 ENCOUNTER — Other Ambulatory Visit: Payer: Self-pay | Admitting: *Deleted

## 2019-08-10 ENCOUNTER — Other Ambulatory Visit: Payer: Self-pay

## 2019-08-10 DIAGNOSIS — O209 Hemorrhage in early pregnancy, unspecified: Secondary | ICD-10-CM | POA: Insufficient documentation

## 2019-08-10 DIAGNOSIS — O0990 Supervision of high risk pregnancy, unspecified, unspecified trimester: Secondary | ICD-10-CM | POA: Diagnosis present

## 2019-08-10 DIAGNOSIS — O99210 Obesity complicating pregnancy, unspecified trimester: Secondary | ICD-10-CM | POA: Insufficient documentation

## 2019-08-10 DIAGNOSIS — O09899 Supervision of other high risk pregnancies, unspecified trimester: Secondary | ICD-10-CM

## 2019-08-10 DIAGNOSIS — Z3A19 19 weeks gestation of pregnancy: Secondary | ICD-10-CM

## 2019-08-10 DIAGNOSIS — O09892 Supervision of other high risk pregnancies, second trimester: Secondary | ICD-10-CM | POA: Diagnosis not present

## 2019-08-22 ENCOUNTER — Other Ambulatory Visit: Payer: Self-pay

## 2019-08-22 ENCOUNTER — Telehealth (INDEPENDENT_AMBULATORY_CARE_PROVIDER_SITE_OTHER): Payer: Medicaid Other | Admitting: Student

## 2019-08-22 DIAGNOSIS — O98112 Syphilis complicating pregnancy, second trimester: Secondary | ICD-10-CM

## 2019-08-22 DIAGNOSIS — O0990 Supervision of high risk pregnancy, unspecified, unspecified trimester: Secondary | ICD-10-CM

## 2019-08-22 DIAGNOSIS — O99210 Obesity complicating pregnancy, unspecified trimester: Secondary | ICD-10-CM

## 2019-08-22 DIAGNOSIS — O99212 Obesity complicating pregnancy, second trimester: Secondary | ICD-10-CM

## 2019-08-22 DIAGNOSIS — E669 Obesity, unspecified: Secondary | ICD-10-CM

## 2019-08-22 DIAGNOSIS — Z3A21 21 weeks gestation of pregnancy: Secondary | ICD-10-CM

## 2019-08-22 DIAGNOSIS — O09899 Supervision of other high risk pregnancies, unspecified trimester: Secondary | ICD-10-CM

## 2019-08-22 DIAGNOSIS — Z3A20 20 weeks gestation of pregnancy: Secondary | ICD-10-CM

## 2019-08-22 DIAGNOSIS — A539 Syphilis, unspecified: Secondary | ICD-10-CM

## 2019-08-22 DIAGNOSIS — O209 Hemorrhage in early pregnancy, unspecified: Secondary | ICD-10-CM

## 2019-08-22 NOTE — Progress Notes (Signed)
Pt states since last [place] she has been concerned of baby's growth,she also states stomach has been tender to touch and within the last couple of days she has not felt much movement. Pt does not have access to BP Cuff right now, states will take later & record.

## 2019-08-22 NOTE — Progress Notes (Signed)
Patient ID: Brooke Howell, female   DOB: 22-Mar-1990, 29 y.o.   MRN: [id number]  I connected with Ruey Storer 08/22/19 at  2:55 PM EDT by: MyChart video and verified that I am speaking with the correct person using two identifiers.  Patient is located at home and provider is located at [facility].     The purpose of this virtual visit is to provide medical care while limiting exposure to the novel coronavirus. I discussed the limitations, risks, security and privacy concerns of performing an evaluation and management service by MyChart video and the availability of in person appointments. I also discussed with the patient that there may be a patient responsible charge related to this service. By engaging in this virtual visit, you consent to the provision of healthcare.  Additionally, you authorize for your insurance to be billed for the services provided during this visit.  The patient expressed understanding and agreed to proceed.  The following staff members participated in the virtual visit:  Pam Sabas Sous    PRENATAL VISIT NOTE  Subjective:  Brooke Howell is a 29 y.o. G4P0030 at [redacted]w[redacted]d  for phone visit for ongoing prenatal care.  She is currently monitored for the following issues for this high-risk pregnancy and has STD (sexually transmitted disease); ASCUS with positive high risk HPV; Positive RPR test; Supervision of high risk pregnancy, antepartum; Vaginal bleeding in pregnancy, first trimester; Obesity affecting pregnancy, antepartum; Short interval between pregnancies affecting pregnancy, antepartum; History of maternal syphilis, currently pregnant; and Staphylococcus aureus, methicillin-resistant on their problem list.  Patient reports overall scared about her baby's movements, her abdominal tenderness when she is working and the size of hte baby. . Feels like when she working her stomach gets tender and baby balls up. She reports it as "pressure in one spot", not a cramp. She  notices that it improves when she relaxes and massages that spot. This happens when she is working; she doesn't notice it when she is not working.   SHe also reports that she was told at [place] that baby's growth was "small". She is tearful about this. She also wants to know when she should feel movements; she is worried that she doesn't feel a lot of movements.    Contractions: Not present. Vag. Bleeding: None.  Movement: (!) Decreased. Denies leaking of fluid.   The following portions of the patient's history were reviewed and updated as appropriate: allergies, current medications, past family history, past medical history, past social history, past surgical history and problem list.   Objective:  There were no vitals filed for this visit. Self-Obtained  Fetal Status:     Movement: (!) Decreased     Assessment and Plan:  Pregnancy: G4P0030 at [redacted]w[redacted]d 1. 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  2. Vaginal bleeding in pregnancy, first trimester -No symptoms  3. Obesity affecting pregnancy, antepartum -will have follow up US   4. Short interval between pregnancies affecting pregnancy, antepartum   5. History of maternal syphilis, currently pregnant -treated appropriately  Preterm labor symptoms and general obstetric precautions including but not limited  to vaginal bleeding, contractions, leaking of fluid and fetal movement were reviewed in detail with the patient.  No follow-ups on file.  Future Appointments  Date Time Provider Department Center  10/05/2019  9:45 AM [facility] NURSE [facility]    10/05/2019 10:00 AM [facility] US1 [facility]     Time spent on virtual visit: 30 minutes  Marylene Land, CNM

## 2019-08-28 ENCOUNTER — Telehealth: Payer: Self-pay

## 2019-08-28 ENCOUNTER — Telehealth: Payer: Self-pay | Admitting: Family Medicine

## 2019-08-28 NOTE — Telephone Encounter (Signed)
Patient is having a lot of lower back and stomach pain. State she sent a mychart message

## 2019-08-28 NOTE — Telephone Encounter (Signed)
Pt concern addressed via MyChart.   Saquoia Sianez, RN  

## 2019-09-05 NOTE — Telephone Encounter (Signed)
Opened in Error  Meron Bocchino,RN  

## 2019-09-19 ENCOUNTER — Other Ambulatory Visit: Payer: Self-pay

## 2019-09-19 ENCOUNTER — Ambulatory Visit (INDEPENDENT_AMBULATORY_CARE_PROVIDER_SITE_OTHER): Payer: Medicaid Other | Admitting: Certified Nurse Midwife

## 2019-09-19 VITALS — BP 108/66 | HR 96 | Wt 234.0 lb

## 2019-09-19 DIAGNOSIS — O99892 Other specified diseases and conditions complicating childbirth: Secondary | ICD-10-CM | POA: Diagnosis not present

## 2019-09-19 DIAGNOSIS — Z3A25 25 weeks gestation of pregnancy: Secondary | ICD-10-CM | POA: Diagnosis not present

## 2019-09-19 DIAGNOSIS — O09893 Supervision of other high risk pregnancies, third trimester: Secondary | ICD-10-CM | POA: Diagnosis not present

## 2019-09-19 DIAGNOSIS — Z3482 Encounter for supervision of other normal pregnancy, second trimester: Secondary | ICD-10-CM

## 2019-09-19 DIAGNOSIS — O99212 Obesity complicating pregnancy, second trimester: Secondary | ICD-10-CM

## 2019-09-19 DIAGNOSIS — E669 Obesity, unspecified: Secondary | ICD-10-CM

## 2019-09-19 DIAGNOSIS — R87810 Cervical high risk human papillomavirus (HPV) DNA test positive: Secondary | ICD-10-CM

## 2019-09-19 NOTE — Progress Notes (Signed)
   PRENATAL VISIT NOTE  Subjective:  Brooke Howell is a 29 y.o. G4P0030 at [redacted]w[redacted]d being seen today for ongoing prenatal care.  She is currently monitored for the following issues for this low-risk pregnancy and has STD (sexually transmitted disease); ASCUS with positive high risk HPV; Positive RPR test; Supervision of high risk pregnancy, antepartum; Vaginal bleeding in pregnancy, first trimester; Obesity affecting pregnancy, antepartum; Short interval between pregnancies affecting pregnancy, antepartum; History of maternal syphilis, currently pregnant; and Staphylococcus aureus, methicillin-resistant on their problem list.  Patient reports no complaints. Had questions about the amount of work she is taking on (2 16hr days and 1 8hr shift per week).  Contractions: Not present. Vag. Bleeding: None.  Movement: Present. Denies leaking of fluid.   The following portions of the patient's history were reviewed and updated as appropriate: allergies, current medications, past family history, past medical history, past social history, past surgical history and problem list.   Objective:   Vitals:   09/19/19 1431  BP: 108/66  Pulse: 96  Weight: 234 lb (106.1 kg)    Fetal Status: Fetal Heart Rate (bpm): 152 Fundal Height: 25 cm Movement: Present     General:  Alert, oriented and cooperative. Patient is in no acute distress.  Skin: Skin is warm and dry. No rash noted.   Cardiovascular: Normal heart rate noted  Respiratory: Normal respiratory effort, no problems with respiration noted  Abdomen: Soft, gravid, appropriate for gestational age.  Pain/Pressure: Present     Pelvic: Cervical exam deferred        Extremities: Normal range of motion.  Edema: None  Mental Status: Normal mood and affect. Normal behavior. Normal judgment and thought content.   Assessment and Plan:  Pregnancy: G4P0030 at [redacted]w[redacted]d 1. Encounter for supervision of other normal pregnancy in second trimester - Gave reassurance that  as long as she feels good/healthy she can continue her work schedule.   2. [redacted] weeks gestation of pregnancy - Anticipatory guidance given regarding next appt and GTT  Preterm labor symptoms and general obstetric precautions including but not limited to vaginal bleeding, contractions, leaking of fluid and fetal movement were reviewed in detail with the patient. Please refer to After Visit Summary for other counseling recommendations.   Return in about 3 weeks (around 10/10/2019) for IN-PERSON, LOB w GTT.  Future Appointments  Date Time Provider Department Center  10/05/2019  9:45 AM [facility] NURSE [facility]  10/05/2019 10:00 AM [facility] US1 [facility]  10/10/2019  8:20 AM [facility] LAB [facility]  10/10/2019  8:55 AM Dan Humphreys, Judye Bos, CNM [facility]    Bernerd Limbo, CNM

## 2019-09-21 ENCOUNTER — Encounter: Payer: Self-pay | Admitting: *Deleted

## 2019-09-26 ENCOUNTER — Encounter: Payer: Self-pay | Admitting: *Deleted

## 2019-10-03 ENCOUNTER — Encounter (HOSPITAL_COMMUNITY): Payer: Self-pay | Admitting: Obstetrics & Gynecology

## 2019-10-03 ENCOUNTER — Inpatient Hospital Stay (HOSPITAL_COMMUNITY)
Admission: AD | Admit: 2019-10-03 | Discharge: 2019-10-03 | Disposition: A | Discharge status: Home / Self Care | Payer: Medicaid Other | Attending: Obstetrics & Gynecology | Admitting: Obstetrics & Gynecology

## 2019-10-03 ENCOUNTER — Other Ambulatory Visit: Payer: Self-pay

## 2019-10-03 DIAGNOSIS — Z87440 Personal history of urinary (tract) infections: Secondary | ICD-10-CM | POA: Diagnosis not present

## 2019-10-03 DIAGNOSIS — O0990 Supervision of high risk pregnancy, unspecified, unspecified trimester: Secondary | ICD-10-CM

## 2019-10-03 DIAGNOSIS — E860 Dehydration: Secondary | ICD-10-CM | POA: Diagnosis not present

## 2019-10-03 DIAGNOSIS — Z3A27 27 weeks gestation of pregnancy: Secondary | ICD-10-CM | POA: Diagnosis not present

## 2019-10-03 DIAGNOSIS — A084 Viral intestinal infection, unspecified: Secondary | ICD-10-CM | POA: Insufficient documentation

## 2019-10-03 DIAGNOSIS — Z79899 Other long term (current) drug therapy: Secondary | ICD-10-CM | POA: Diagnosis not present

## 2019-10-03 DIAGNOSIS — O09899 Supervision of other high risk pregnancies, unspecified trimester: Secondary | ICD-10-CM

## 2019-10-03 DIAGNOSIS — O99282 Endocrine, nutritional and metabolic diseases complicating pregnancy, second trimester: Secondary | ICD-10-CM | POA: Insufficient documentation

## 2019-10-03 DIAGNOSIS — O99210 Obesity complicating pregnancy, unspecified trimester: Secondary | ICD-10-CM

## 2019-10-03 DIAGNOSIS — Z3689 Encounter for other specified antenatal screening: Secondary | ICD-10-CM

## 2019-10-03 DIAGNOSIS — O98512 Other viral diseases complicating pregnancy, second trimester: Secondary | ICD-10-CM | POA: Insufficient documentation

## 2019-10-03 DIAGNOSIS — A09 Infectious gastroenteritis and colitis, unspecified: Secondary | ICD-10-CM

## 2019-10-03 DIAGNOSIS — Z87891 Personal history of nicotine dependence: Secondary | ICD-10-CM | POA: Insufficient documentation

## 2019-10-03 DIAGNOSIS — O98812 Other maternal infectious and parasitic diseases complicating pregnancy, second trimester: Secondary | ICD-10-CM

## 2019-10-03 DIAGNOSIS — Z7982 Long term (current) use of aspirin: Secondary | ICD-10-CM | POA: Diagnosis not present

## 2019-10-03 DIAGNOSIS — O209 Hemorrhage in early pregnancy, unspecified: Secondary | ICD-10-CM

## 2019-10-03 LAB — URINALYSIS, ROUTINE W REFLEX MICROSCOPIC
Bilirubin Urine: NEGATIVE
Glucose, UA: NEGATIVE mg/dL
Hgb urine dipstick: NEGATIVE
Ketones, ur: 20 mg/dL — AB
Leukocytes,Ua: NEGATIVE
Nitrite: NEGATIVE
Protein, ur: 30 mg/dL — AB
Specific Gravity, Urine: 1.028 (ref 1.005–1.030)
pH: 5 (ref 5.0–8.0)

## 2019-10-03 LAB — BASIC METABOLIC PANEL
Anion gap: 9 (ref 5–15)
BUN: 6 mg/dL (ref 6–20)
CO2: 24 mmol/L (ref 22–32)
Calcium: 9.4 mg/dL (ref 8.9–10.3)
Chloride: 103 mmol/L (ref 98–111)
Creatinine, Ser: 0.96 mg/dL (ref 0.44–1.00)
GFR calc Af Amer: >60 mL/min (ref >60)
GFR calc non Af Amer: >60 mL/min (ref >60)
Glucose, Bld: 101 mg/dL — ABNORMAL HIGH (ref 70–99)
Potassium: 4 mmol/L (ref 3.5–5.1)
Sodium: 136 mmol/L (ref 135–145)

## 2019-10-03 LAB — CBC
HCT: 32.8 % — ABNORMAL LOW (ref 36.0–46.0)
Hemoglobin: 10.9 g/dL — ABNORMAL LOW (ref 12.0–15.0)
MCH: 30.7 pg (ref 26.0–34.0)
MCHC: 33.2 g/dL (ref 30.0–36.0)
MCV: 92.4 fL (ref 80.0–100.0)
Platelets: 276 10*3/uL (ref 150–400)
RBC: 3.55 MIL/uL — ABNORMAL LOW (ref 3.87–5.11)
RDW: 13.5 % (ref 11.5–15.5)
WBC: 11.1 10*3/uL — ABNORMAL HIGH (ref 4.0–10.5)
nRBC: 0 % (ref 0.0–0.2)

## 2019-10-03 MED ORDER — LACTATED RINGERS IV BOLUS
1000.0000 mL | Freq: Once | INTRAVENOUS | Status: AC
  Administered 2019-10-03: 1000 mL via INTRAVENOUS

## 2019-10-03 MED ORDER — ONDANSETRON HCL 4 MG/2ML IJ SOLN
4.0000 mg | Freq: Once | INTRAMUSCULAR | Status: AC
  Administered 2019-10-03: 4 mg via INTRAVENOUS
  Filled 2019-10-03: qty 2

## 2019-10-03 MED ORDER — ONDANSETRON 4 MG PO TBDP
4.0000 mg | ORAL_TABLET | Freq: Three times a day (TID) | ORAL | 0 refills | Status: DC | PRN
Start: 2019-10-03 — End: 2019-12-23

## 2019-10-03 NOTE — MAU Note (Signed)
Pt reports yesterday she had diarrhea, vomiting and just didn't feel good. Diarrhea stopped but she is still unable to keep anything down.
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
## 2019-10-03 NOTE — Discharge Instructions (Signed)
Viral Gastroenteritis, Adult  Viral gastroenteritis is also known as the stomach flu. This condition may affect your stomach, your small intestine, and your large intestine. It can cause sudden watery poop (diarrhea), fever, and throwing up (vomiting). This condition is caused by certain germs (viruses). These germs can be passed from person to person very easily (are contagious). Having watery poop and throwing up can make you feel weak and cause you to not have enough water in your body (get dehydrated). This can make you tired and thirsty, make you have a dry mouth, and make it so you pee (urinate) less often. It is important to replace the fluids that you lose from having watery poop and throwing up. What are the causes?  You can get sick by catching viruses from other people.  You can also get sick by: ? Eating food, drinking water, or touching a surface that has the viruses on it (is contaminated). ? Sharing utensils or other personal items with a person who is sick. What increases the risk?  Having a weak body defense system (immune system).  Living with one or more children who are younger than 2 years old.  Living in a nursing home.  Going on cruise ships. What are the signs or symptoms? Symptoms of this condition start suddenly. Symptoms may last for a few days or for as long as a week.  Common symptoms include: ? Watery poop. ? Throwing up.  Other symptoms include: ? Fever. ? Headache. ? Feeling tired (fatigue). ? Pain in the belly (abdomen). ? Chills. ? Feeling weak. ? Feeling sick to your stomach (nauseous). ? Muscle aches. ? Not feeling hungry. How is this treated?  This condition typically goes away on its own.  The focus of treatment is to replace the fluids that you lose. This condition may be treated with: ? An ORS (oral rehydration solution). This is a drink that is sold at pharmacies and stores. ? Medicines to help with your symptoms. ? Probiotic  supplements to reduce symptoms of diarrhea. ? Fluids given through an IV tube, if needed.  Older adults and people with other diseases or a weak body defense system are at higher risk for not having enough water in the body. Follow these instructions at home: Eating and drinking   Take an ORS as told by your doctor.  Drink clear fluids in small amounts as you are able. Clear fluids include: ? Water. ? Ice chips. ? Fruit juice with water added to it (diluted). ? Low-calorie sports drinks.  Drink enough fluid to keep your pee (urine) pale yellow.  Eat small amounts of healthy foods every 3-4 hours as you are able. This may include whole grains, fruits, vegetables, lean meats, and yogurt.  Avoid fluids that have a lot of sugar or caffeine in them, such as energy drinks, sports drinks, and soda.  Avoid spicy or fatty foods.  Avoid alcohol. General instructions   Wash your hands often. This is very important after you have watery poop or you throw up. If you cannot use soap and water, use hand sanitizer.  Make sure that all people in your home wash their hands well and often.  Take over-the-counter and prescription medicines only as told by your doctor.  Rest at home while you get better.  Watch your condition for any changes.  Take a warm bath to help with any burning or pain from having watery poop.  Keep all follow-up visits as told by your doctor.   This is important. Contact a doctor if:  You cannot keep fluids down.  Your symptoms get worse.  You have new symptoms.  You feel light-headed.  You feel dizzy.  You have muscle cramps. Get help right away if:  You have chest pain.  You feel very weak.  You pass out (faint).  You see blood in your throw-up.  Your throw-up looks like coffee grounds.  You have bloody or black poop (stools) or poop that looks like tar.  You have a very bad headache, or a stiff neck, or both.  You have a rash.  You have  very bad pain, cramping, or bloating in your belly.  You have trouble breathing.  You are breathing very quickly.  You have a fast heartbeat.  Your skin feels cold and clammy.  You feel mixed up (confused).  You have pain when you pee.  You have signs of not having enough water in the body, such as: ? Dark pee, hardly any pee, or no pee. ? Cracked lips. ? Dry mouth. ? Sunken eyes. ? Feeling very sleepy. ? Feeling weak. Summary  Viral gastroenteritis is also known as the stomach flu.  This condition can cause sudden watery poop (diarrhea), fever, and throwing up (vomiting).  These germs can be passed from person to person very easily.  Take an ORS as told by your doctor. This is a drink that is sold at pharmacies and stores.  Drink fluids in small amounts many times each day as you are able. This information is not intended to replace advice given to you by your health care provider. Make sure you discuss any questions you have with your health care provider. Document Revised: 11/03/2017 Document Reviewed: 11/03/2017 Elsevier Patient Education  2020 [employer].   Food Choices to Help Relieve Diarrhea, Adult When you have diarrhea, the foods you eat and your eating habits are very important. Choosing the right foods and drinks can help:  Relieve diarrhea.  Replace lost fluids and nutrients.  Prevent dehydration. What general guidelines should I follow?  Relieving diarrhea  Choose foods with less than 2 g or .07 oz. of fiber per serving.  Limit fats to less than 8 tsp (38 g or 1.34 oz.) a day.  Avoid the following: ? Foods and beverages sweetened with high-fructose corn syrup, honey, or sugar alcohols such as xylitol, sorbitol, and mannitol. ? Foods that contain a lot of fat or sugar. ? Fried, greasy, or spicy foods. ? High-fiber grains, breads, and cereals. ? Raw fruits and vegetables.  Eat foods that are rich in probiotics. These foods include dairy  products such as yogurt and fermented milk products. They help increase healthy bacteria in the stomach and intestines (gastrointestinal tract, or GI tract).  If you have lactose intolerance, avoid dairy products. These may make your diarrhea worse.  Take medicine to help stop diarrhea (antidiarrheal medicine) only as told by your health care provider. Replacing nutrients  Eat small meals or snacks every 3-4 hours.  Eat bland foods, such as white rice, toast, or baked potato, until your diarrhea starts to get better. Gradually reintroduce nutrient-rich foods as tolerated or as told by your health care provider. This includes: ? Well-cooked protein foods. ? Peeled, seeded, and soft-cooked fruits and vegetables. ? Low-fat dairy products.  Take vitamin and mineral supplements as told by your health care provider. Preventing dehydration  Start by sipping water or a special solution to prevent dehydration (oral rehydration solution, ORS). Urine that  is clear or pale yellow means that you are getting enough fluid.  Try to drink at least 8-10 cups of fluid each day to help replace lost fluids.  You may add other liquids in addition to water, such as clear juice or decaffeinated sports drinks, as tolerated or as told by your health care provider.  Avoid drinks with caffeine, such as coffee, tea, or soft drinks.  Avoid alcohol. What foods are recommended?     The items listed may not be a complete list. Talk with your health care provider about what dietary choices are best for you. Grains White rice. White, [place], or pita breads (fresh or toasted), including plain rolls, buns, or bagels. White pasta. Saltine, soda, or graham crackers. Pretzels. Low-fiber cereal. Cooked cereals made with water (such as cornmeal, farina, or cream cereals). Plain muffins. Matzo. Melba toast. Zwieback. Vegetables Potatoes (without the skin). Most well-cooked and canned vegetables without skins or seeds. Tender  lettuce. Fruits Apple sauce. Fruits canned in juice. Cooked apricots, cherries, grapefruit, peaches, pears, or plums. Fresh bananas and cantaloupe. Meats and other protein foods Baked or boiled chicken. Eggs. Tofu. Fish. Seafood. Smooth nut butters. Ground or well-cooked tender beef, ham, veal, lamb, pork, or poultry. Dairy Plain yogurt, kefir, and unsweetened liquid yogurt. Lactose-free milk, buttermilk, skim milk, or soy milk. Low-fat or nonfat hard cheese. Beverages Water. Low-calorie sports drinks. Fruit juices without pulp. Strained tomato and vegetable juices. Decaffeinated teas. Sugar-free beverages not sweetened with sugar alcohols. Oral rehydration solutions, if approved by your health care provider. Seasoning and other foods Bouillon, broth, or soups made from recommended foods. What foods are not recommended? The items listed may not be a complete list. Talk with your health care provider about what dietary choices are best for you. Grains Whole grain, whole wheat, bran, or rye breads, rolls, pastas, and crackers. Wild or brown rice. Whole grain or bran cereals. Barley. Oats and oatmeal. Corn tortillas or taco shells. Granola. Popcorn. Vegetables Raw vegetables. Fried vegetables. Cabbage, broccoli, Brussels sprouts, artichokes, baked beans, beet greens, corn, kale, legumes, peas, sweet potatoes, and yams. Potato skins. Cooked spinach and cabbage. Fruits Dried fruit, including raisins and dates. Raw fruits. Stewed or dried prunes. Canned fruits with syrup. Meat and other protein foods Fried or fatty meats. Deli meats. Chunky nut butters. Nuts and seeds. Beans and lentils. Tomasa Blase. Hot dogs. Sausage. Dairy High-fat cheeses. Whole milk, chocolate milk, and beverages made with milk, such as milk shakes. Half-and-half. Cream. sour cream. Ice cream. Beverages Caffeinated beverages (such as coffee, tea, soda, or energy drinks). Alcoholic beverages. Fruit juices with pulp. Prune juice. Soft  drinks sweetened with high-fructose corn syrup or sugar alcohols. High-calorie sports drinks. Fats and oils Butter. Cream sauces. Margarine. Salad oils. Plain salad dressings. Olives. Avocados. Mayonnaise. Sweets and desserts Sweet rolls, doughnuts, and sweet breads. Sugar-free desserts sweetened with sugar alcohols such as xylitol and sorbitol. Seasoning and other foods Honey. Hot sauce. Chili powder. Gravy. Cream-based or milk-based soups. Pancakes and waffles. Summary  When you have diarrhea, the foods you eat and your eating habits are very important.  Make sure you get at least 8-10 cups of fluid each day, or enough to keep your urine clear or pale yellow.  Eat bland foods and gradually reintroduce healthy, nutrient-rich foods as tolerated, or as told by your health care provider.  Avoid high-fiber, fried, greasy, or spicy foods. This information is not intended to replace advice given to you by your health care provider. Make sure  you discuss any questions you have with your health care provider. Document Revised: 04/21/2018 Document Reviewed: 12/27/2015 Elsevier Patient Education  2020 Elsevier Inc.  

## 2019-10-05 ENCOUNTER — Ambulatory Visit: Payer: Medicaid Other | Admitting: *Deleted

## 2019-10-05 ENCOUNTER — Other Ambulatory Visit: Payer: Self-pay | Admitting: *Deleted

## 2019-10-05 ENCOUNTER — Ambulatory Visit: Payer: Medicaid Other | Attending: Obstetrics and Gynecology

## 2019-10-05 ENCOUNTER — Other Ambulatory Visit: Payer: Self-pay

## 2019-10-05 DIAGNOSIS — E669 Obesity, unspecified: Secondary | ICD-10-CM

## 2019-10-05 DIAGNOSIS — O09899 Supervision of other high risk pregnancies, unspecified trimester: Secondary | ICD-10-CM

## 2019-10-05 DIAGNOSIS — O0990 Supervision of high risk pregnancy, unspecified, unspecified trimester: Secondary | ICD-10-CM | POA: Insufficient documentation

## 2019-10-05 DIAGNOSIS — O09892 Supervision of other high risk pregnancies, second trimester: Secondary | ICD-10-CM | POA: Diagnosis not present

## 2019-10-05 DIAGNOSIS — O209 Hemorrhage in early pregnancy, unspecified: Secondary | ICD-10-CM | POA: Diagnosis present

## 2019-10-05 DIAGNOSIS — Z3A27 27 weeks gestation of pregnancy: Secondary | ICD-10-CM | POA: Diagnosis not present

## 2019-10-05 DIAGNOSIS — O99210 Obesity complicating pregnancy, unspecified trimester: Secondary | ICD-10-CM

## 2019-10-05 DIAGNOSIS — O99212 Obesity complicating pregnancy, second trimester: Secondary | ICD-10-CM | POA: Diagnosis not present

## 2019-10-05 DIAGNOSIS — Z362 Encounter for other antenatal screening follow-up: Secondary | ICD-10-CM

## 2019-10-09 ENCOUNTER — Other Ambulatory Visit: Payer: Self-pay | Admitting: General Practice

## 2019-10-09 DIAGNOSIS — O0990 Supervision of high risk pregnancy, unspecified, unspecified trimester: Secondary | ICD-10-CM

## 2019-10-10 ENCOUNTER — Ambulatory Visit (INDEPENDENT_AMBULATORY_CARE_PROVIDER_SITE_OTHER): Payer: Medicaid Other | Admitting: Certified Nurse Midwife

## 2019-10-10 ENCOUNTER — Other Ambulatory Visit: Payer: Medicaid Other

## 2019-10-10 ENCOUNTER — Other Ambulatory Visit: Payer: Self-pay

## 2019-10-10 VITALS — BP 124/80 | HR 96 | Wt 238.2 lb

## 2019-10-10 DIAGNOSIS — O0990 Supervision of high risk pregnancy, unspecified, unspecified trimester: Secondary | ICD-10-CM

## 2019-10-10 DIAGNOSIS — Z3A28 28 weeks gestation of pregnancy: Secondary | ICD-10-CM

## 2019-10-10 NOTE — Progress Notes (Signed)
   PRENATAL VISIT NOTE  Subjective:  Brooke Howell is a 29 y.o. G4P0030 at [redacted]w[redacted]d being seen today for ongoing prenatal care.  She is currently monitored for the following issues for this high-risk pregnancy and has STD (sexually transmitted disease); ASCUS with positive high risk HPV; Positive RPR test; Supervision of high risk pregnancy, antepartum; Vaginal bleeding in pregnancy, first trimester; Obesity affecting pregnancy, antepartum; Short interval between pregnancies affecting pregnancy, antepartum; History of maternal syphilis, currently pregnant; and Staphylococcus aureus, methicillin-resistant on their problem list.  Patient reports no complaints and nausea from the GTT.  Contractions: Not present. Vag. Bleeding: None.  Movement: Present. Denies leaking of fluid.   The following portions of the patient's history were reviewed and updated as appropriate: allergies, current medications, past family history, past medical history, past social history, past surgical history and problem list.   Objective:   Vitals:   10/10/19 0922  BP: 124/80  Pulse: 96  Weight: 238 lb 3.2 oz (108 kg)    Fetal Status: Fetal Heart Rate (bpm): 154   Movement: Present     General:  Alert, oriented and cooperative. Patient is in no acute distress.  Skin: Skin is warm and dry. No rash noted.   Cardiovascular: Normal heart rate noted  Respiratory: Normal respiratory effort, no problems with respiration noted  Abdomen: Soft, gravid, appropriate for gestational age.  Pain/Pressure: Absent     Pelvic: Cervical exam deferred        Extremities: Normal range of motion.  Edema: Trace  Mental Status: Normal mood and affect. Normal behavior. Normal judgment and thought content.   Assessment and Plan:  Pregnancy: G4P0030 at [redacted]w[redacted]d 1. Supervision of high risk pregnancy, antepartum - Doing well other than nausea this morning  2. [redacted] weeks gestation of pregnancy  - GTT and 3rd trimester labs drawn today -  Offered virtual visit, pt prefers in-person visits so she can hear baby with the Doppler. Verbalizes severe anxiety when she has to go too long between visits  Preterm labor symptoms and general obstetric precautions including but not limited to vaginal bleeding, contractions, leaking of fluid and fetal movement were reviewed in detail with the patient. Please refer to After Visit Summary for other counseling recommendations.   Return in about 2 weeks (around 10/24/2019) for LOB, IN-PERSON.  Future Appointments  Date Time Provider Department Center  10/26/2019 10:15 AM Marylene Land, CNM [facility]  11/16/2019  9:30 AM [facility] NURSE [facility]  11/16/2019  9:45 AM [facility] US5 [facility]   Edd Arbour, CNM, MSN, [facility] 10/10/19 4:28 PM

## 2019-10-10 NOTE — Patient Instructions (Signed)
Try Alaffia African Black Soap with tea tree and eucalyptus for body/vulva washing. Helps balance pH without causing BV.  Third Trimester of Pregnancy  The third trimester is from week 28 through week 40 (months 7 through 9). This trimester is when your unborn baby (fetus) is growing very fast. At the end of the ninth month, the unborn baby is about 20 inches in length. It weighs about 6-10 pounds. Follow these instructions at home: Medicines  Take over-the-counter and prescription medicines only as told by your doctor. Some medicines are safe and some medicines are not safe during pregnancy.  Take a prenatal vitamin that contains at least 600 micrograms (mcg) of folic acid.  If you have trouble pooping (constipation), take medicine that will make your stool soft (stool softener) if your doctor approves. Eating and drinking   Eat regular, healthy meals.  Avoid raw meat and uncooked cheese.  If you get low calcium from the food you eat, talk to your doctor about taking a daily calcium supplement.  Eat four or five small meals rather than three large meals a day.  Avoid foods that are high in fat and sugars, such as fried and sweet foods.  To prevent constipation: ? Eat foods that are high in fiber, like fresh fruits and vegetables, whole grains, and beans. ? Drink enough fluids to keep your pee (urine) clear or pale yellow. Activity  Exercise only as told by your doctor. Stop exercising if you start to have cramps.  Avoid heavy lifting, wear low heels, and sit up straight.  Do not exercise if it is too hot, too humid, or if you are in a place of great height (high altitude).  You may continue to have sex unless your doctor tells you not to. Relieving pain and discomfort  Wear a good support bra if your breasts are tender.  Take frequent breaks and rest with your legs raised if you have leg cramps or low back pain.  Take warm water baths (sitz baths) to soothe pain or  discomfort caused by hemorrhoids. Use hemorrhoid cream if your doctor approves.  If you develop puffy, bulging veins (varicose veins) in your legs: ? Wear support hose or compression stockings as told by your doctor. ? Raise (elevate) your feet for 15 minutes, 3-4 times a day. ? Limit salt in your food. Safety  Wear your seat belt when driving.  Make a list of emergency phone numbers, including numbers for family, friends, the hospital, and police and fire departments. Preparing for your baby's arrival To prepare for the arrival of your baby:  Take prenatal classes.  Practice driving to the hospital.  Visit the hospital and tour the maternity area.  Talk to your work about taking leave once the baby comes.  Pack your hospital bag.  Prepare the baby's room.  Go to your doctor visits.  Buy a rear-facing car seat. Learn how to install it in your car. General instructions  Do not use hot tubs, steam rooms, or saunas.  Do not use any products that contain nicotine or tobacco, such as cigarettes and e-cigarettes. If you need help quitting, ask your doctor.  Do not drink alcohol.  Do not douche or use tampons or scented sanitary pads.  Do not cross your legs for long periods of time.  Do not travel for long distances unless you must. Only do so if your doctor says it is okay.  Visit your dentist if you have not gone during your pregnancy.  Use a soft toothbrush to brush your teeth. Be gentle when you floss.  Avoid cat litter boxes and soil used by cats. These carry germs that can cause birth defects in the baby and can cause a loss of your baby (miscarriage) or stillbirth.  Keep all your prenatal visits as told by your doctor. This is important. Contact a doctor if:  You are not sure if you are in labor or if your water has broken.  You are dizzy.  You have mild cramps or pressure in your lower belly.  You have a nagging pain in your belly area.  You continue to feel  sick to your stomach, you throw up, or you have watery poop.  You have bad smelling fluid coming from your vagina.  You have pain when you pee. Get help right away if:  You have a fever.  You are leaking fluid from your vagina.  You are spotting or bleeding from your vagina.  You have severe belly cramps or pain.  You lose or gain weight quickly.  You have trouble catching your breath and have chest pain.  You notice sudden or extreme puffiness (swelling) of your face, hands, ankles, feet, or legs.  You have not felt the baby move in over an hour.  You have severe headaches that do not go away with medicine.  You have trouble seeing.  You are leaking, or you are having a gush of fluid, from your vagina before you are 37 weeks.  You have regular belly spasms (contractions) before you are 37 weeks. Summary  The third trimester is from week 28 through week 40 (months 7 through 9). This time is when your unborn baby is growing very fast.  Follow your doctor's advice about medicine, food, and activity.  Get ready for the arrival of your baby by taking prenatal classes, getting all the baby items ready, preparing the baby's room, and visiting your doctor to be checked.  Get help right away if you are bleeding from your vagina, or you have chest pain and trouble catching your breath, or if you have not felt your baby move in over an hour. This information is not intended to replace advice given to you by your health care provider. Make sure you discuss any questions you have with your health care provider. Document Revised: 04/21/2018 Document Reviewed: 02/04/2016 Elsevier Patient Education  2020 [employer].

## 2019-10-11 LAB — GLUCOSE TOLERANCE, 2 HOURS W/ 1HR
Glucose, 1 hour: 166 mg/dL (ref 65–179)
Glucose, 2 hour: 135 mg/dL (ref 65–152)
Glucose, Fasting: 95 mg/dL — ABNORMAL HIGH (ref 65–91)

## 2019-10-11 LAB — CBC
Hematocrit: 32.6 % — ABNORMAL LOW (ref 34.0–46.6)
Hemoglobin: 10.4 g/dL — ABNORMAL LOW (ref 11.1–15.9)
MCH: 29.4 pg (ref 26.6–33.0)
MCHC: 31.9 g/dL (ref 31.5–35.7)
MCV: 92 fL (ref 79–97)
Platelets: 278 10*3/uL (ref 150–450)
RBC: 3.54 x10E6/uL — ABNORMAL LOW (ref 3.77–5.28)
RDW: 13.2 % (ref 11.7–15.4)
WBC: 8.5 10*3/uL (ref 3.4–10.8)

## 2019-10-11 LAB — RPR, QUANT+TP ABS (REFLEX)
Rapid Plasma Reagin, Quant: 1:2 {titer} — ABNORMAL HIGH
T Pallidum Abs: REACTIVE — AB

## 2019-10-11 LAB — RPR: RPR Ser Ql: REACTIVE — AB

## 2019-10-11 LAB — HIV ANTIBODY (ROUTINE TESTING W REFLEX): HIV Screen 4th Generation wRfx: NONREACTIVE

## 2019-10-26 ENCOUNTER — Ambulatory Visit (INDEPENDENT_AMBULATORY_CARE_PROVIDER_SITE_OTHER): Payer: Medicaid Other | Admitting: Student

## 2019-10-26 ENCOUNTER — Other Ambulatory Visit (HOSPITAL_COMMUNITY)
Admission: RE | Admit: 2019-10-26 | Discharge: 2019-10-26 | Disposition: A | Discharge status: Home / Self Care | Payer: Medicaid Other | Source: Ambulatory Visit | Attending: Student | Admitting: Student

## 2019-10-26 VITALS — BP 121/80 | HR 116 | Wt 247.5 lb

## 2019-10-26 DIAGNOSIS — Z23 Encounter for immunization: Secondary | ICD-10-CM | POA: Diagnosis not present

## 2019-10-26 DIAGNOSIS — O0990 Supervision of high risk pregnancy, unspecified, unspecified trimester: Secondary | ICD-10-CM

## 2019-10-26 DIAGNOSIS — N898 Other specified noninflammatory disorders of vagina: Secondary | ICD-10-CM | POA: Insufficient documentation

## 2019-10-26 DIAGNOSIS — O24419 Gestational diabetes mellitus in pregnancy, unspecified control: Secondary | ICD-10-CM | POA: Insufficient documentation

## 2019-10-26 DIAGNOSIS — A4902 Methicillin resistant Staphylococcus aureus infection, unspecified site: Secondary | ICD-10-CM

## 2019-10-26 DIAGNOSIS — O24410 Gestational diabetes mellitus in pregnancy, diet controlled: Secondary | ICD-10-CM

## 2019-10-26 HISTORY — DX: Gestational diabetes mellitus in pregnancy, unspecified control: O24.419

## 2019-10-26 NOTE — Progress Notes (Signed)
   PRENATAL VISIT NOTE  Subjective:  Brooke Howell is a 29 y.o. G4P0030 at [redacted]w[redacted]d being seen today for ongoing prenatal care.  She is currently monitored for the following issues for this high-risk pregnancy and has STD (sexually transmitted disease); ASCUS with positive high risk HPV; Positive RPR test; Supervision of high risk pregnancy, antepartum; Vaginal bleeding in pregnancy, first trimester; Obesity affecting pregnancy, antepartum; Short interval between pregnancies affecting pregnancy, antepartum; History of maternal syphilis, currently pregnant; Staphylococcus aureus, methicillin-resistant; and GDM (gestational diabetes mellitus) on their problem list.  Patient reports that she has an odor that smells kind of like sea salt. Denies any other vaginal complaints. .  Reports that blood pressure has been normal at home. She is under a lot of stress due to FOB currently incarcerated and social issues with his other partners. Reports some ankle swelling. Curious about [place] that showed enlargment of baby's stomach.    Contractions: Not present. Vag. Bleeding: None.  Movement: Present. Denies leaking of fluid.   The following portions of the patient's history were reviewed and updated as appropriate: allergies, current medications, past family history, past medical history, past social history, past surgical history and problem list.   Objective:   Vitals:   10/26/19 1036  BP: 121/80  Pulse: (!) 116  Weight: 247 lb 8 oz (112.3 kg)    Fetal Status: Fetal Heart Rate (bpm): 154 Fundal Height: 30 cm Movement: Present     General:  Alert, oriented and cooperative. Patient is in no acute distress.  Skin: Skin is warm and dry. No rash noted.   Cardiovascular: Normal heart rate noted  Respiratory: Normal respiratory effort, no problems with respiration noted  Abdomen: Soft, gravid, appropriate for gestational age.  Pain/Pressure: Present     Pelvic: Cervical exam deferred        Extremities:  Normal range of motion.  Edema: Trace  Mental Status: Normal mood and affect. Normal behavior. Normal judgment and thought content.   Assessment and Plan:  Pregnancy: G4P0030 at [redacted]w[redacted]d 1. Vaginal odor -Will check for STIs today through blind swab - Cervicovaginal ancillary only( [place])  2. 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  -Discussed diet and reducing carbs.  - Tdap vaccine greater than or equal to 7yo IM  Preterm labor symptoms and general obstetric precautions including but not limited to vaginal bleeding, contractions, leaking of fluid and fetal movement were reviewed in detail with the patient. Please refer to After Visit Summary for other counseling recommendations.   Return in about 2 weeks (around 11/09/2019), or HROB with KK or JW.  Future Appointments  Date Time Provider Department Center  11/07/2019  3:15 PM [facility]  11/09/2019  9:55 AM [facility]  11/16/2019  9:30 AM [facility] NURSE [facility]  11/16/2019  9:45 AM [facility] US5 [facility]    Samara Deist Gena Fray, CNM

## 2019-10-27 LAB — CERVICOVAGINAL ANCILLARY ONLY
Bacterial Vaginitis (gardnerella): NEGATIVE
Candida Glabrata: NEGATIVE
Candida Vaginitis: NEGATIVE
Chlamydia: NEGATIVE
Comment: NEGATIVE
Comment: NEGATIVE
Comment: NEGATIVE
Comment: NEGATIVE
Comment: NEGATIVE
Comment: NORMAL
Neisseria Gonorrhea: NEGATIVE
Trichomonas: NEGATIVE

## 2019-10-31 ENCOUNTER — Other Ambulatory Visit: Payer: Medicaid Other

## 2019-11-07 ENCOUNTER — Ambulatory Visit: Payer: Medicaid Other | Admitting: Registered"

## 2019-11-07 ENCOUNTER — Other Ambulatory Visit: Payer: Self-pay

## 2019-11-07 ENCOUNTER — Other Ambulatory Visit: Payer: Self-pay | Admitting: Lactation Services

## 2019-11-07 DIAGNOSIS — O24419 Gestational diabetes mellitus in pregnancy, unspecified control: Secondary | ICD-10-CM

## 2019-11-07 MED ORDER — ACCU-CHEK GUIDE VI STRP: ORAL_STRIP | 12 refills | Status: DC

## 2019-11-07 MED ORDER — ACCU-CHEK SOFTCLIX LANCETS MISC: 12 refills | Status: DC

## 2019-11-07 NOTE — Progress Notes (Signed)
Patient was seen on 11/07/19 for Gestational Diabetes self-management. EDD 01/01/20. Patient states no history of GDM. Diet history obtained. Patient eats variety of all food groups. Beverages include 1 gal water daily, sweet tea, can of fruity soda 2x/week.  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  The following learning objectives were met by the patient :   States the definition of Gestational Diabetes  States why dietary management is important in controlling blood glucose  Describes the effects of carbohydrates on blood glucose levels  Demonstrates ability to create a balanced meal plan  Demonstrates carbohydrate counting   States when to check blood glucose levels  Demonstrates proper blood glucose monitoring techniques  States the effect of stress and exercise on blood glucose levels  States the importance of limiting caffeine and abstaining from alcohol and smoking  Plan:  Aim for 3 Carbohydrate Choices per meal (45 grams) +/- 1 either way  Aim for 1-2 Carbohydrate Choices per snack Begin reading food labels for Total Carbohydrate of foods If OK with your MD, consider  increasing your activity level by walking, Arm Chair Exercises or other activity daily as tolerated Begin checking Blood Glucose before breakfast and 2 hours after first bite of breakfast, lunch and dinner as directed by MD  Bring Log Book/Sheet and meter to every medical appointment  Baby Scripts: Patient to record on glucose log sheet due to BS 2.0 not capable of glucose management at this time. Take medication if directed by MD  Blood glucose monitor given: Accu-chek Guide Me Lot #[id number] Exp: 10/31/2020 CBG: 120 mg/dL  Rx order placed for  Accu-chek Guide strips and Softclix lancets  Patient instructed to monitor glucose levels: FBS: 60 - 95 mg/dl 2 hour: <120 mg/dl  Patient received the following handouts:  Nutrition Diabetes and  Pregnancy  Carbohydrate Counting List  Blood glucose Log Sheet  Patient will be seen for follow-up as needed.

## 2019-11-07 NOTE — Progress Notes (Signed)
Accuchek Guide lancets and strips sent to pharmacy per Heywood Bene, RD

## 2019-11-09 ENCOUNTER — Ambulatory Visit (INDEPENDENT_AMBULATORY_CARE_PROVIDER_SITE_OTHER): Payer: Medicaid Other | Admitting: Certified Nurse Midwife

## 2019-11-09 ENCOUNTER — Other Ambulatory Visit: Payer: Self-pay

## 2019-11-09 VITALS — BP 123/73 | HR 106 | Wt 249.5 lb

## 2019-11-09 DIAGNOSIS — Z3A32 32 weeks gestation of pregnancy: Secondary | ICD-10-CM

## 2019-11-09 DIAGNOSIS — N949 Unspecified condition associated with female genital organs and menstrual cycle: Secondary | ICD-10-CM

## 2019-11-09 DIAGNOSIS — O24410 Gestational diabetes mellitus in pregnancy, diet controlled: Secondary | ICD-10-CM

## 2019-11-09 DIAGNOSIS — O0990 Supervision of high risk pregnancy, unspecified, unspecified trimester: Secondary | ICD-10-CM

## 2019-11-09 MED ORDER — COMFORT FIT MATERNITY SUPP LG MISC: 1.0000 [IU] | Freq: Every day | 0 refills | Status: DC

## 2019-11-09 NOTE — Patient Instructions (Signed)

## 2019-11-09 NOTE — Progress Notes (Signed)
   PRENATAL VISIT NOTE  Subjective:  Brooke Howell is a 29 y.o. G4P0030 at [redacted]w[redacted]d being seen today for ongoing prenatal care.  She is currently monitored for the following issues for this high-risk pregnancy and has STD (sexually transmitted disease); ASCUS with positive high risk HPV; Positive RPR test; Supervision of high risk pregnancy, antepartum; Vaginal bleeding in pregnancy, first trimester; Obesity affecting pregnancy, antepartum; Short interval between pregnancies affecting pregnancy, antepartum; History of maternal syphilis, currently pregnant; Staphylococcus aureus, methicillin-resistant; and GDM (gestational diabetes mellitus) on their problem list.  Patient reports round ligament pains.  Contractions: Not present. Vag. Bleeding: None.  Movement: Present. Denies leaking of fluid.   The following portions of the patient's history were reviewed and updated as appropriate: allergies, current medications, past family history, past medical history, past social history, past surgical history and problem list.   Objective:   Vitals:   11/09/19 1018  BP: 123/73  Pulse: (!) 106  Weight: 249 lb 8 oz (113.2 kg)    Fetal Status: Fetal Heart Rate (bpm): 154 Fundal Height: 32 cm Movement: Present     General:  Alert, oriented and cooperative. Patient is in no acute distress.  Skin: Skin is warm and dry. No rash noted.   Cardiovascular: Normal heart rate noted  Respiratory: Normal respiratory effort, no problems with respiration noted  Abdomen: Soft, gravid, appropriate for gestational age.  Pain/Pressure: Present     Pelvic: Cervical exam deferred        Extremities: Normal range of motion.  Edema: Mild pitting, slight indentation  Mental Status: Normal mood and affect. Normal behavior. Normal judgment and thought content.   Assessment and Plan:  Pregnancy: G4P0030 at [redacted]w[redacted]d 1. Supervision of high risk pregnancy, antepartum - Pt doing well overall, has some swelling in ankles/feet -  Advised to wear compression socks at work and increase protein/hydration  2. [redacted] weeks gestation of pregnancy - Needs UTI TOC at next visit, emphasized importance of giving a urine sample at next visit  3. Round ligament pain - Elastic Bandages & Supports (COMFORT FIT MATERNITY SUPP LG) MISC; 1 Units by Does not apply route daily.  Dispense: 1 each; Refill: 0 - Advised to put on while lying down for best support of pelvis - Demonstrated round ligament massage, advised to perform twice daily  4. Diet controlled gestational diabetes mellitus (GDM) in third trimester - Pt just began daily testing. Works nights and has been not eating on shift and then waking up to eat so she can stick to scheduled glucose testing. Advised to eat/rest as works best for her shifts and just notate which reading is fasting, it is usually the dinner time test that will be fasting for her.   Preterm labor symptoms and general obstetric precautions including but not limited to vaginal bleeding, contractions, leaking of fluid and fetal movement were reviewed in detail with the patient. Please refer to After Visit Summary for other counseling recommendations.   Return in about 2 weeks (around 11/23/2019) for IN-PERSON, HROB.  Future Appointments  Date Time Provider Department Center  11/16/2019  9:30 AM [facility] NURSE [facility]  11/16/2019  9:45 AM [facility] US5 [facility]  11/23/2019 10:55 AM Bernerd Limbo, CNM [facility]   Edd Arbour, CNM, MSN, [facility] 11/09/19 11:51 AM

## 2019-11-16 ENCOUNTER — Other Ambulatory Visit: Payer: Self-pay

## 2019-11-16 ENCOUNTER — Ambulatory Visit: Payer: Medicaid Other | Attending: Obstetrics and Gynecology

## 2019-11-16 ENCOUNTER — Other Ambulatory Visit: Payer: Self-pay | Admitting: *Deleted

## 2019-11-16 ENCOUNTER — Encounter: Payer: Self-pay | Admitting: *Deleted

## 2019-11-16 ENCOUNTER — Ambulatory Visit: Payer: Medicaid Other | Admitting: *Deleted

## 2019-11-16 DIAGNOSIS — Z148 Genetic carrier of other disease: Secondary | ICD-10-CM

## 2019-11-16 DIAGNOSIS — O99213 Obesity complicating pregnancy, third trimester: Secondary | ICD-10-CM | POA: Diagnosis not present

## 2019-11-16 DIAGNOSIS — O24419 Gestational diabetes mellitus in pregnancy, unspecified control: Secondary | ICD-10-CM

## 2019-11-16 DIAGNOSIS — O209 Hemorrhage in early pregnancy, unspecified: Secondary | ICD-10-CM

## 2019-11-16 DIAGNOSIS — O0990 Supervision of high risk pregnancy, unspecified, unspecified trimester: Secondary | ICD-10-CM | POA: Diagnosis present

## 2019-11-16 DIAGNOSIS — O99210 Obesity complicating pregnancy, unspecified trimester: Secondary | ICD-10-CM | POA: Diagnosis present

## 2019-11-16 DIAGNOSIS — O09899 Supervision of other high risk pregnancies, unspecified trimester: Secondary | ICD-10-CM | POA: Insufficient documentation

## 2019-11-16 DIAGNOSIS — E669 Obesity, unspecified: Secondary | ICD-10-CM | POA: Diagnosis not present

## 2019-11-16 DIAGNOSIS — O09893 Supervision of other high risk pregnancies, third trimester: Secondary | ICD-10-CM

## 2019-11-16 DIAGNOSIS — O24410 Gestational diabetes mellitus in pregnancy, diet controlled: Secondary | ICD-10-CM

## 2019-11-16 DIAGNOSIS — Z362 Encounter for other antenatal screening follow-up: Secondary | ICD-10-CM | POA: Insufficient documentation

## 2019-11-16 DIAGNOSIS — Z3A33 33 weeks gestation of pregnancy: Secondary | ICD-10-CM

## 2019-11-23 ENCOUNTER — Other Ambulatory Visit: Payer: Self-pay

## 2019-11-23 ENCOUNTER — Ambulatory Visit (INDEPENDENT_AMBULATORY_CARE_PROVIDER_SITE_OTHER): Payer: Medicaid Other | Admitting: Certified Nurse Midwife

## 2019-11-23 VITALS — BP 148/80 | HR 114 | Wt 253.8 lb

## 2019-11-23 DIAGNOSIS — Z3A34 34 weeks gestation of pregnancy: Secondary | ICD-10-CM

## 2019-11-23 DIAGNOSIS — O0993 Supervision of high risk pregnancy, unspecified, third trimester: Secondary | ICD-10-CM

## 2019-11-23 DIAGNOSIS — O24410 Gestational diabetes mellitus in pregnancy, diet controlled: Secondary | ICD-10-CM

## 2019-11-23 NOTE — Progress Notes (Signed)
PRENATAL VISIT NOTE  Subjective:  Brooke Howell is a 29 y.o. G4P0030 at [redacted]w[redacted]d being seen today for ongoing prenatal care.  She is currently monitored for the following issues for this high-risk pregnancy and has STD (sexually transmitted disease); ASCUS with positive high risk HPV; Positive RPR test; Supervision of high risk pregnancy, antepartum; Vaginal bleeding in pregnancy, first trimester; Obesity affecting pregnancy, antepartum; Short interval between pregnancies affecting pregnancy, antepartum; History of maternal syphilis, currently pregnant; Staphylococcus aureus, methicillin-resistant; and GDM (gestational diabetes mellitus) on their problem list.  Patient reports no complaints.  Contractions: Not present. Vag. Bleeding: None.  Movement: Present. Denies leaking of fluid.   The following portions of the patient's history were reviewed and updated as appropriate: allergies, current medications, past family history, past medical history, past social history, past surgical history and problem list.   Objective:   Vitals:   11/23/19 1131  BP: (!) 148/80  Pulse: (!) 114  Weight: 253 lb 12.8 oz (115.1 kg)    Fetal Status: Fetal Heart Rate (bpm): 160 Fundal Height: 34 cm Movement: Present     General:  Alert, oriented and cooperative. Patient is in no acute distress.  Skin: Skin is warm and dry. No rash noted.   Cardiovascular: Normal heart rate noted  Respiratory: Normal respiratory effort, no problems with respiration noted  Abdomen: Soft, gravid, appropriate for gestational age.  Pain/Pressure: Absent     Pelvic: Cervical exam deferred        Extremities: Normal range of motion.  Edema: Mild pitting, slight indentation  Mental Status: Normal mood and affect. Normal behavior. Normal judgment and thought content.   Assessment and Plan:  Pregnancy: G4P0030 at [redacted]w[redacted]d 1. Supervision of high risk pregnancy in third trimester - Pt feeling well other than having a harder time  keeping up at work. Wears her support band, but still has pain in her legs and lower back at the end of a shift. 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  2. [redacted] weeks gestation of pregnancy - Anticipatory guidance given regarding GBS testing at next visit  3. Diet controlled gestational diabetes mellitus (GDM) in third trimester - Glucose numbers mostly out of range, fastings between 95-105, post-prandials 120-130s. This Sunday-Wednesday morning were in range for all readings, pt states this is because she changed her diet and has not worked nights (night shift readings were always the highest). - After consulting with Dr. Macon Large, suggested addition of Metformin BID, pt declined starting it at this time. Emphasized importance of keeping glucose readings in range not only for AGA sizing but also for decreasing risk of poor fetal outcomes. Pt plans to change work schedule so she is not working nights, and will continue with her new eating plan. Agreed to this plan for a week with MD follow up in one week. Pt agrees to add Metformin at next visit if glucose readings are not in range.  Preterm labor symptoms and general obstetric precautions including but not limited to vaginal bleeding, contractions, leaking of fluid and fetal movement were reviewed in detail with the patient. Please refer to After Visit Summary for other counseling recommendations.   Return in about 1 week (around 11/30/2019) for HROB, IN-PERSON.  Future Appointments  Date Time Provider Department Center  11/30/2019 10:55 AM Malachy Chamber, MD [facility]  12/14/2019 10:15 AM Tereso Newcomer, MD [facility]  12/14/2019  2:15 PM [facility] NURSE [facility]  12/14/2019  2:30  PM [facility] US3 [facility]   Edd Arbour, CNM, MSN, [facility] 11/23/19 12:50 PM

## 2019-11-30 ENCOUNTER — Encounter: Payer: Self-pay | Admitting: *Deleted

## 2019-11-30 ENCOUNTER — Ambulatory Visit (INDEPENDENT_AMBULATORY_CARE_PROVIDER_SITE_OTHER): Payer: Medicaid Other | Admitting: Obstetrics & Gynecology

## 2019-11-30 ENCOUNTER — Encounter: Payer: Self-pay | Admitting: Obstetrics & Gynecology

## 2019-11-30 ENCOUNTER — Other Ambulatory Visit: Payer: Self-pay

## 2019-11-30 VITALS — BP 112/74 | HR 108 | Wt 255.0 lb

## 2019-11-30 DIAGNOSIS — O24410 Gestational diabetes mellitus in pregnancy, diet controlled: Secondary | ICD-10-CM

## 2019-11-30 DIAGNOSIS — O0990 Supervision of high risk pregnancy, unspecified, unspecified trimester: Secondary | ICD-10-CM

## 2019-11-30 NOTE — Progress Notes (Signed)
   PRENATAL VISIT NOTE  Subjective:  Brooke Howell is a 29 y.o. G4P0030 at [redacted]w[redacted]d being seen today for ongoing prenatal care.  She is currently monitored for the following issues for this high-risk pregnancy and has STD (sexually transmitted disease); ASCUS with positive high risk HPV; Positive RPR test; Supervision of high risk pregnancy, antepartum; Vaginal bleeding in pregnancy, first trimester; Obesity affecting pregnancy, antepartum; Short interval between pregnancies affecting pregnancy, antepartum; History of maternal syphilis, currently pregnant; Staphylococcus aureus, methicillin-resistant; and GDM (gestational diabetes mellitus) on their problem list.  Patient reports backachef.  Contractions: Not present. Vag. Bleeding: None.  Movement: Present. Denies leaking of fluid.   The following portions of the patient's history were reviewed and updated as appropriate: allergies, current medications, past family history, past medical history, past social history, past surgical history and problem list.   Objective:   Vitals:   11/30/19 1053  BP: 112/74  Pulse: (!) 108  Weight: 255 lb (115.7 kg)    Fetal Status: Fetal Heart Rate (bpm): 150   Movement: Present     General:  Alert, oriented and cooperative. Patient is in no acute distress.  Skin: Skin is warm and dry. No rash noted.   Cardiovascular: Normal heart rate noted  Respiratory: Normal respiratory effort, no problems with respiration noted  Abdomen: Soft, gravid, appropriate for gestational age.  Pain/Pressure: Absent     Pelvic: Cervical exam deferred        Extremities: Normal range of motion.  Edema: Trace  Mental Status: Normal mood and affect. Normal behavior. Normal judgment and thought content.   Assessment and Plan:  Pregnancy: G4P0030 at [redacted]w[redacted]d 1. Supervision of high risk pregnancy, antepartum Cultures next visit.   2. Diet controlled gestational diabetes mellitus (GDM), antepartum Glucose log reviewed, well  controlled with diet and reduction in hours.   Preterm labor symptoms and general obstetric precautions including but not limited to vaginal bleeding, contractions, leaking of fluid and fetal movement were reviewed in detail with the patient. Please refer to After Visit Summary for other counseling recommendations.   No follow-ups on file.  Future Appointments  Date Time Provider Department Center  12/14/2019 10:15 AM Tereso Newcomer, MD [facility]  12/14/2019  2:15 PM [facility] NURSE [facility]  12/14/2019  2:30 PM [facility] US3 [facility]    Malachy Chamber, MD

## 2019-12-06 ENCOUNTER — Other Ambulatory Visit (HOSPITAL_COMMUNITY)
Admission: RE | Admit: 2019-12-06 | Discharge: 2019-12-06 | Disposition: A | Discharge status: Home / Self Care | Payer: Medicaid Other | Source: Ambulatory Visit | Attending: Obstetrics & Gynecology | Admitting: Obstetrics & Gynecology

## 2019-12-06 ENCOUNTER — Other Ambulatory Visit: Payer: Self-pay

## 2019-12-06 ENCOUNTER — Ambulatory Visit (INDEPENDENT_AMBULATORY_CARE_PROVIDER_SITE_OTHER): Payer: Medicaid Other | Admitting: Obstetrics & Gynecology

## 2019-12-06 ENCOUNTER — Telehealth: Payer: Self-pay | Admitting: General Practice

## 2019-12-06 VITALS — BP 124/89 | HR 117 | Wt 260.4 lb

## 2019-12-06 DIAGNOSIS — O321XX0 Maternal care for breech presentation, not applicable or unspecified: Secondary | ICD-10-CM

## 2019-12-06 DIAGNOSIS — O0990 Supervision of high risk pregnancy, unspecified, unspecified trimester: Secondary | ICD-10-CM | POA: Diagnosis present

## 2019-12-06 DIAGNOSIS — O24410 Gestational diabetes mellitus in pregnancy, diet controlled: Secondary | ICD-10-CM

## 2019-12-06 LAB — POCT URINALYSIS DIP (DEVICE)
Bilirubin Urine: NEGATIVE
Glucose, UA: NEGATIVE mg/dL
Ketones, ur: NEGATIVE mg/dL
Leukocytes,Ua: NEGATIVE
Nitrite: NEGATIVE
Protein, ur: NEGATIVE mg/dL
Specific Gravity, Urine: >=1.03 (ref 1.005–1.030)
Urobilinogen, UA: 0.2 mg/dL (ref 0.0–1.0)
pH: 6.5 (ref 5.0–8.0)

## 2019-12-06 NOTE — Progress Notes (Signed)
   PRENATAL VISIT NOTE  Subjective:  Brooke Howell is a 29 y.o. G4P0030 at [redacted]w[redacted]d being seen today for ongoing prenatal care.  She is currently monitored for the following issues for this high-risk pregnancy and has Supervision of high risk pregnancy, antepartum; Obesity affecting pregnancy, antepartum; Short interval between pregnancies affecting pregnancy, antepartum; History of maternal syphilis, currently pregnant; Staphylococcus aureus, methicillin-resistant; and GDM (gestational diabetes mellitus) on their problem list.  Patient reports no complaints.  Contractions: Irritability. Vag. Bleeding: None.  Movement: Present. Denies leaking of fluid.   The following portions of the patient's history were reviewed and updated as appropriate: allergies, current medications, past family history, past medical history, past social history, past surgical history and problem list.   Objective:   Vitals:   12/06/19 0853  BP: 124/89  Pulse: (!) 117  Weight: 260 lb 6.4 oz (118.1 kg)    Fetal Status: Fetal Heart Rate (bpm): 155   Movement: Present     General:  Alert, oriented and cooperative. Patient is in no acute distress.  Skin: Skin is warm and dry. No rash noted.   Cardiovascular: Normal heart rate noted  Respiratory: Normal respiratory effort, no problems with respiration noted  Abdomen: Soft, gravid, appropriate for gestational age.  Pain/Pressure: Present     Pelvic: Cervical exam performed in the presence of a chaperone      closed/posterior/high  Extremities: Normal range of motion.  Edema: Trace  Mental Status: Normal mood and affect. Normal behavior. Normal judgment and thought content.   Assessment and Plan:  Pregnancy: G4P0030 at [redacted]w[redacted]d 1. Supervision of high risk pregnancy, antepartum - Culture, beta strep (group b only) - GC/Chlamydia probe amp ([place])not at [facility]  2. Diet controlled gestational diabetes mellitus (GDM) in third trimester Most CBGs are WNL; continue  diet Growth [place] 12/14/19  3.  Breech Pt opts for ECV; schedule at 37 weeks.   Term labor symptoms and general obstetric precautions including but not limited to vaginal bleeding, contractions, leaking of fluid and fetal movement were reviewed in detail with the patient. Please refer to After Visit Summary for other counseling recommendations.   No follow-ups on file.  Future Appointments  Date Time Provider Department Center  12/14/2019 10:15 AM Tereso Newcomer, MD [facility]  12/14/2019  2:15 PM [facility] NURSE [facility]  12/14/2019  2:30 PM [facility] US3 [facility]    Elsie Lincoln, MD

## 2019-12-06 NOTE — Telephone Encounter (Signed)
ECV scheduled for 12/2 @ 10am. Called to inform patient, no answer, unable to leave message as voicemail box was full. Will send mychart message

## 2019-12-06 NOTE — Patient Instructions (Signed)
AREA PEDIATRIC/FAMILY PRACTICE PHYSICIANS  [place] ([address]) . [facility] o Chambliss, MD; Eniola, MD; Hale, MD; Hensel, MD; McDiarmid, MD; McIntyer, MD; Neal, MD; Walden, MD o [address] [place], [place] [address] o [phone] o Mon-Fri 8:30-12:30, 1:30-5:00 o Providers come to see babies at [facility] o Accepting Medicaid . [facility] o Limited providers who accept newborns: Koirala, MD; Morrow, MD; Wolters, MD o [address], [place], [place] [address] o [phone] o Mon-Fri 8:00-5:30 o Babies seen by providers at [facility] o Does NOT accept Medicaid o Please call early in hospitalization for appointment (limited availability)  . [facility] o Mulberry, MD o [address] [place], [place] [address] o [phone] o Mon, Tue, Thur, Fri 8:30-5:00, Wed 10:00-7:00 (closed 1-2pm) o Babies seen by [facility] providers o Accepting Medicaid . [facility] - Pediatrician o Rubin, MD o [address], [place], [place] [address] o [phone] o Mon-Fri 8:30-5:00, Sat 8:30-12:00 o Provider comes to see babies at [facility] o Accepting Medicaid o Must have been referred from current patients or contacted office prior to delivery . [facility] ([facility]) o Brown, MD; Chandler, MD; Ettefagh, MD; Grant, MD; Lester, MD; McCormick, MD; McQueen, MD; Prose, MD; Simha, MD; Stanley, MD; Stryffeler, NP; Tebben, NP o [address], [place], [place] [address] o [phone] o Mon, Tue, Thur, Fri 8:30-5:30, Wed 9:30-5:30, Sat 8:30-12:30 o Babies seen by [facility] providers o Accepting Medicaid o Only accepting infants of first-time parents or siblings of current patients o Hospital discharge coordinator will make follow-up appointment . Jack Amos o [address],  [place], [place]  [address] o [phone]   Fax - [phone] . [facility] o [address], [place], [place]  [address] o Phone - [phone]   Fax - [phone] . Shilpa Gosrani o [address], [place], [place]  [address] o [phone]  [place] ([address]) . [facility] o Bates, MD; Brassfield, MD; Cooper, Cox, MD; MD; Davis, MD; Dovico, MD; Ettefaugh, MD; Little, MD; Lowe, MD; Keiffer, MD; Melvin, MD; Sumner, MD; Williams, MD o [address], [place], [place] [address] o [phone] o Mon-Fri 8:30-5:00 (extended evenings Mon-Thur as needed), Sat-Sun 10:00-1:00 o Providers come to see babies at [facility] o Accepting Medicaid for families of first-time babies and families with all children in the household age 3 and under. Must register with office prior to making appointment (M-F only). . [facility] o Henson, NP; Knapp, MD; Lalonde, MD; Tysinger, PA o [address] [place], [place] [address] o [phone] o Mon-Fri 8:00-5:00 o Babies seen by providers at [facility] o Does NOT accept Medicaid/Commercial Insurance Only . [facility] ([facility]  o Artis, MD; Barnes, MD; Bratton, MD; Coccaro, MD; [place] Gardner, MD; Kramer, MD; Marshall, MD; Netherton, MD; Poleto, MD; Skinner, MD o [address] [place], [place] [address] o [phone] o Mon-Fri 8:30-5:30, Sat (Oct.-Mar.) 9:00-1:00 o Babies seen by providers at [facility] o Accepting Medicaid  [place] ([address]) . [facility] o Reid, MD; Warner, MD o [address], [place], [place] [address] o [phone] o Mon-Fri 8:30-5:00, Sat 8:30-12:00 o Providers come to see babies at [facility] o Does NOT accept Medicaid . [facility] o Becker, PA; Hagler, MD; Scifres, PA; Sun, MD; Swayne, MD o [address],  [place], [place] [address] o [phone] o Mon-Fri 8:00-5:00 o Babies seen by providers at [facility] o Does NOT accept Medicaid o Only accepting babies of parents who are patients o Please call early in hospitalization for appointment (limited availability) . [facility] o Clark, MD; Frye, MD; Kelleher, MD; Mack, NP; Miller, MD; O'Keller, MD; Patterson, NP; Pudlo, MD; Puzio, MD; Thomas, MD; Tucker, MD; Twiselton, MD o [address], [place], [place] [address] o [phone] o Mon-Fri 8:00-5:00, Sat 9:00-12:00 o Providers come to see babies at [facility] o Does NOT accept Medicaid  [place] ([address]) . [facility] o Limited providers accepting new patients: Brake, NP; Wharton, PA o [address], [place], [place] [address] o [phone] o Mon-Fri 8:00-5:00 o Babies seen by providers at [facility] o Does NOT accept Medicaid o Only accepting babies of parents who are patients o Please call early in hospitalization for appointment (limited availability) . [facility] o Gay, MD; Quinlan, MD o [address] [place], [place] [address] o [phone] (press 1 to schedule appointment) o Mon-Fri 8:00-5:00 o Providers come to see babies at [facility] o Does NOT accept Medicaid . [facility] o Mazer, MD o [address] [place], [place] [address] o [phone] o Mon-Fri 8:30-5:00 (lunch 12:30-1:00), extended hours by appointment only Wed 5:00-6:30 o Babies seen by [facility] providers o Accepting Medicaid . [facility] o Banks, MD; Jordan, MD; Koberlein, MD o [address], [place], [place] [address] o [phone] o Mon-Fri 8:00-5:00 o Babies seen by [facility] providers o Does NOT accept Medicaid . [facility] o Parker, MD; Hunter, MD; Wallace, DO o [address] [place], [place]  [address] o [phone] o Mon-Fri 8:00-5:00 o Babies seen by [facility] providers o Does NOT accept Medicaid . [facility] o Brandon, PA; Brecken, PA; Christy, NP; Dees, MD; DeClaire, MD; DeWeese, MD; Hansen, NP; Mills, NP; Parrish, NP; Smoot, NP; Summer, MD; Vapne, MD o [address] [place], [place] [address] o [phone] o Mon-Fri 8:30-5:00, Sat 10:00-1:00 o Providers come to see babies at [facility] o Does NOT accept Medicaid o Free prenatal information session Tuesdays at 4:45pm . [facility] o Bouska, MD; Gordon, PA; Jeffery, PA; Weber, PA o [address] [place] [address] o [phone] o Mon-Fri 7:30-5:30 o Babies seen by [facility] providers . [facility] o [address], [place], [place]  [address] o [phone]   Fax - [phone]  [place] ([address]) . [facility] o Reese, MD o [address] [place], [place] [address] o [phone] o Mon-Thur 8:00-6:00 o Providers come to see babies at [facility] o Accepting Medicaid . [facility] o Anderson, NP; Badger, MD; Beal, PA; Spencer, PA o [address] [place], [place] [address] o [phone] o Mon-Thur 7:30-7:30, Fri 7:30-4:30 o Babies seen by [facility] providers o Accepting Medicaid . [facility] o Agbuya, MD; Klett, NP; Romgoolam, MD o [address], [place],  [address] o [phone] o Mon-Fri 8:30-5:00, Sat 8:30-12:00 o Providers come to see babies at [facility] o Accepting Medicaid o Must have "Meet & Greet" appointment at office prior to delivery . [facility] o McCord,   MD; Wallace, MD; Wood, MD o [address], [place], [place] [address] o [phone] o Mon-Wed 8:00-6:00, Thur-Fri 8:00-5:00, Sat 9:00-12:00 o Providers come to  see babies at [facility] o Does NOT accept Medicaid o Only accepting siblings of current patients . [facility]  o [address], [place], [place]  [address] o [phone]   Fax - [phone] . [facility] o [address], [place], [place]  [address] o [phone]   Fax - [phone]  [place] ([address]) . [facility] o Cirigliano, DO; Matthews, DO o [address] [place], [place] [address] o [phone] o Mon-Fri 7:00-5:00 o Babies seen by [facility] providers o Does NOT accept Medicaid . [facility] o Briscoe, MD; Howley, PA; Moreira, PA o [address], [place], [place] [address] o [phone] o Mon-Fri 8:00-5:00 o Babies seen by [facility] providers o Accepting Medicaid . [facility] o Boyd, MD; Church, PA; Jones, NP; Osborn, PA o [address], [place], [place] [address] o [phone] o Mon-Fri 8:00-5:00 o Babies seen by providers at [facility] o Accepting Medicaid   

## 2019-12-08 LAB — GC/CHLAMYDIA PROBE AMP (~~LOC~~) NOT AT ARMC
Chlamydia: NEGATIVE
Comment: NEGATIVE
Comment: NORMAL
Neisseria Gonorrhea: NEGATIVE

## 2019-12-09 ENCOUNTER — Other Ambulatory Visit: Payer: Self-pay | Admitting: Advanced Practice Midwife

## 2019-12-09 LAB — CULTURE, BETA STREP (GROUP B ONLY): Strep Gp B Culture: NEGATIVE

## 2019-12-11 ENCOUNTER — Telehealth (HOSPITAL_COMMUNITY): Payer: Self-pay | Admitting: *Deleted

## 2019-12-11 NOTE — Telephone Encounter (Signed)
Rescheduled covid test appointment for patient convenience.  Address reviewed with verbalized understanding.  Instructed to be NPO after midnight for Version.  Questions answered.

## 2019-12-12 ENCOUNTER — Other Ambulatory Visit (HOSPITAL_COMMUNITY): Payer: Medicaid Other

## 2019-12-12 ENCOUNTER — Telehealth: Payer: Self-pay | Admitting: General Practice

## 2019-12-12 ENCOUNTER — Inpatient Hospital Stay (HOSPITAL_COMMUNITY): Admission: RE | Admit: 2019-12-12 | Payer: Medicaid Other | Source: Ambulatory Visit

## 2019-12-12 NOTE — Telephone Encounter (Signed)
Patient called into front office upset regarding previous appointments being made. Patient states she was here last week and was told she needed to have a ECV scheduled. She states she never received a phone call about it just a Clinical cytogeneticist message. Patient states she messaged me stated she needed a different appointment but understands we have been closed. She states over the weekend another appointment was made without her knowing. She states she called on Monday and talked with a couple different people and found out she had an appt for a COVID test. Patient states when she went for her COVID test and the nurse there told her she couldn't go to work afterwards because she had to quarantine. 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 Patient states frustration due to the lack of communication and no one responding to the message she sent. Apologized to patient and explained that unfortunately her message only came to me and I have been out since the holiday. Apologized for the errors/miscommunications that have occurred. Explained preadmission is a different department from [place] and likely scheduled her testing appt over the weekend and thus didn't know her schedule/availability. Offered cancelling her ECV and scheduling an OB appt this week instead so she can come in and speak with a provider, talk about the procedure & schedule something while she is in the office so we can make sure it suits her schedule. Patient verbalized understanding & was appreciative. She states she is going to call her job and see if she can get coverage and if not will call us back.

## 2019-12-13 ENCOUNTER — Other Ambulatory Visit (HOSPITAL_COMMUNITY)
Admission: RE | Admit: 2019-12-13 | Discharge: 2019-12-13 | Disposition: A | Discharge status: Home / Self Care | Payer: Medicaid Other | Source: Ambulatory Visit | Attending: Family Medicine | Admitting: Family Medicine

## 2019-12-13 DIAGNOSIS — Z20822 Contact with and (suspected) exposure to covid-19: Secondary | ICD-10-CM | POA: Diagnosis not present

## 2019-12-13 DIAGNOSIS — Z01812 Encounter for preprocedural laboratory examination: Secondary | ICD-10-CM | POA: Insufficient documentation

## 2019-12-13 LAB — SARS CORONAVIRUS 2 (TAT 6-24 HRS): SARS Coronavirus 2: NEGATIVE

## 2019-12-14 ENCOUNTER — Ambulatory Visit (HOSPITAL_COMMUNITY)
Admission: RE | Admit: 2019-12-14 | Discharge: 2019-12-14 | Disposition: A | Discharge status: Home / Self Care | Payer: Medicaid Other | Source: Ambulatory Visit | Attending: Obstetrics & Gynecology | Admitting: Obstetrics & Gynecology

## 2019-12-14 ENCOUNTER — Observation Stay (HOSPITAL_COMMUNITY): Admit: 2019-12-14 | Payer: Medicaid Other | Admitting: Obstetrics & Gynecology

## 2019-12-14 ENCOUNTER — Other Ambulatory Visit: Payer: Self-pay

## 2019-12-14 ENCOUNTER — Inpatient Hospital Stay (HOSPITAL_COMMUNITY)
Admission: AD | Admit: 2019-12-14 | Payer: Medicaid Other | Source: Home / Self Care | Admitting: Obstetrics & Gynecology

## 2019-12-14 ENCOUNTER — Encounter: Payer: Medicaid Other | Admitting: Obstetrics & Gynecology

## 2019-12-14 ENCOUNTER — Observation Stay (HOSPITAL_COMMUNITY): Admission: RE | Admit: 2019-12-14 | Payer: Medicaid Other | Admitting: Obstetrics & Gynecology

## 2019-12-14 ENCOUNTER — Ambulatory Visit: Payer: Medicaid Other | Admitting: *Deleted

## 2019-12-14 ENCOUNTER — Ambulatory Visit (HOSPITAL_BASED_OUTPATIENT_CLINIC_OR_DEPARTMENT_OTHER): Payer: Medicaid Other

## 2019-12-14 ENCOUNTER — Encounter (HOSPITAL_COMMUNITY): Payer: Self-pay

## 2019-12-14 ENCOUNTER — Encounter: Payer: Self-pay | Admitting: *Deleted

## 2019-12-14 DIAGNOSIS — O99213 Obesity complicating pregnancy, third trimester: Secondary | ICD-10-CM | POA: Diagnosis not present

## 2019-12-14 DIAGNOSIS — Z87891 Personal history of nicotine dependence: Secondary | ICD-10-CM | POA: Diagnosis not present

## 2019-12-14 DIAGNOSIS — O09893 Supervision of other high risk pregnancies, third trimester: Secondary | ICD-10-CM

## 2019-12-14 DIAGNOSIS — O09899 Supervision of other high risk pregnancies, unspecified trimester: Secondary | ICD-10-CM

## 2019-12-14 DIAGNOSIS — O24419 Gestational diabetes mellitus in pregnancy, unspecified control: Secondary | ICD-10-CM

## 2019-12-14 DIAGNOSIS — O24410 Gestational diabetes mellitus in pregnancy, diet controlled: Secondary | ICD-10-CM

## 2019-12-14 DIAGNOSIS — Z3A37 37 weeks gestation of pregnancy: Secondary | ICD-10-CM

## 2019-12-14 DIAGNOSIS — O321XX0 Maternal care for breech presentation, not applicable or unspecified: Secondary | ICD-10-CM | POA: Insufficient documentation

## 2019-12-14 DIAGNOSIS — O0990 Supervision of high risk pregnancy, unspecified, unspecified trimester: Secondary | ICD-10-CM | POA: Insufficient documentation

## 2019-12-14 DIAGNOSIS — Z148 Genetic carrier of other disease: Secondary | ICD-10-CM

## 2019-12-14 DIAGNOSIS — Z01818 Encounter for other preprocedural examination: Secondary | ICD-10-CM | POA: Insufficient documentation

## 2019-12-14 DIAGNOSIS — O99210 Obesity complicating pregnancy, unspecified trimester: Secondary | ICD-10-CM | POA: Insufficient documentation

## 2019-12-14 DIAGNOSIS — Z7982 Long term (current) use of aspirin: Secondary | ICD-10-CM | POA: Diagnosis not present

## 2019-12-14 LAB — CBC
HCT: 34.5 % — ABNORMAL LOW (ref 36.0–46.0)
Hemoglobin: 11 g/dL — ABNORMAL LOW (ref 12.0–15.0)
MCH: 28.2 pg (ref 26.0–34.0)
MCHC: 31.9 g/dL (ref 30.0–36.0)
MCV: 88.5 fL (ref 80.0–100.0)
Platelets: 311 10*3/uL (ref 150–400)
RBC: 3.9 MIL/uL (ref 3.87–5.11)
RDW: 14.5 % (ref 11.5–15.5)
WBC: 8.6 10*3/uL (ref 4.0–10.5)
nRBC: 0 % (ref 0.0–0.2)

## 2019-12-14 LAB — TYPE AND SCREEN
ABO/RH(D): O POS
Antibody Screen: NEGATIVE

## 2019-12-14 MED ORDER — TERBUTALINE SULFATE 1 MG/ML IJ SOLN
0.2500 mg | Freq: Once | INTRAMUSCULAR | Status: AC
  Administered 2019-12-14: 0.25 mg via SUBCUTANEOUS

## 2019-12-14 MED ORDER — LACTATED RINGERS IV SOLN: INTRAVENOUS | Status: DC

## 2019-12-14 MED ORDER — TERBUTALINE SULFATE 1 MG/ML IJ SOLN
INTRAMUSCULAR | Status: AC
  Filled 2019-12-14: qty 1

## 2019-12-14 NOTE — Progress Notes (Signed)
C/o" watery discharge x 2-3 weeks, OB MD aware."

## 2019-12-14 NOTE — Progress Notes (Signed)
Pt states " attempted version this morning-failed, C/S scheduled for 12/24/19 at 0800."

## 2019-12-14 NOTE — Progress Notes (Signed)
Pt ambulatory, with support person. FHR reviewed with second reviewer. Discharge instructions reviewed with patient, and pt verbalized understanding.
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
## 2019-12-15 ENCOUNTER — Encounter (HOSPITAL_COMMUNITY): Payer: Self-pay

## 2019-12-15 ENCOUNTER — Telehealth (HOSPITAL_COMMUNITY): Payer: Self-pay | Admitting: *Deleted

## 2019-12-15 NOTE — Patient Instructions (Signed)
Brooke Howell  12/15/2019   Your procedure is scheduled on:  12/24/2019  Arrive at 0745 at Entrance C on [address] at [facility]  and [employer]. You are invited to use the FREE valet parking or use the Visitor's parking deck.  Pick up the phone at the desk and dial [phone].  Call this number if you have problems the morning of surgery: [phone]  Remember:   Do not eat food:(After Midnight) Desps de medianoche.  Do not drink clear liquids: (After Midnight) Desps de medianoche.  Take these medicines the morning of surgery with A SIP OF WATER:  none   Do not wear jewelry, make-up or nail polish.  Do not wear lotions, powders, or perfumes. Do not wear deodorant.  Do not shave 48 hours prior to surgery.  Do not bring valuables to the hospital.  [facility] is not   responsible for any belongings or valuables brought to the hospital.  Contacts, dentures or bridgework may not be worn into surgery.  Leave suitcase in the car. After surgery it may be brought to your room.  For patients admitted to the hospital, checkout time is 11:00 AM the day of              discharge.      Please read over the following fact sheets that you were given:     Preparing for Surgery

## 2019-12-15 NOTE — Telephone Encounter (Signed)
Preadmission screen  

## 2019-12-18 ENCOUNTER — Encounter (HOSPITAL_COMMUNITY): Payer: Self-pay

## 2019-12-20 ENCOUNTER — Other Ambulatory Visit (HOSPITAL_COMMUNITY): Payer: Medicaid Other

## 2019-12-20 ENCOUNTER — Encounter (HOSPITAL_COMMUNITY)
Admission: RE | Admit: 2019-12-20 | Payer: Medicaid Other | Source: Ambulatory Visit | Admitting: Obstetrics & Gynecology

## 2019-12-21 ENCOUNTER — Encounter (HOSPITAL_COMMUNITY)
Admission: AD | Disposition: A | Discharge status: Home / Self Care | Payer: Self-pay | Source: Home / Self Care | Attending: Family Medicine

## 2019-12-21 ENCOUNTER — Telehealth (HOSPITAL_COMMUNITY): Payer: Self-pay | Admitting: *Deleted

## 2019-12-21 ENCOUNTER — Encounter (HOSPITAL_COMMUNITY): Payer: Self-pay | Admitting: Anesthesiology

## 2019-12-21 ENCOUNTER — Ambulatory Visit (INDEPENDENT_AMBULATORY_CARE_PROVIDER_SITE_OTHER): Payer: Medicaid Other | Admitting: Obstetrics & Gynecology

## 2019-12-21 ENCOUNTER — Inpatient Hospital Stay (HOSPITAL_COMMUNITY)
Admission: AD | Admit: 2019-12-21 | Discharge: 2019-12-23 | DRG: 788 | Disposition: A | Discharge status: Home / Self Care | Payer: Medicaid Other | Attending: Family Medicine | Admitting: Family Medicine

## 2019-12-21 ENCOUNTER — Inpatient Hospital Stay (HOSPITAL_COMMUNITY): Payer: Medicaid Other | Admitting: Anesthesiology

## 2019-12-21 ENCOUNTER — Other Ambulatory Visit: Payer: Self-pay

## 2019-12-21 ENCOUNTER — Encounter (HOSPITAL_COMMUNITY): Payer: Self-pay | Admitting: Obstetrics and Gynecology

## 2019-12-21 ENCOUNTER — Other Ambulatory Visit: Payer: Self-pay | Admitting: Obstetrics and Gynecology

## 2019-12-21 VITALS — BP 140/87 | HR 94 | Wt 260.3 lb

## 2019-12-21 DIAGNOSIS — O163 Unspecified maternal hypertension, third trimester: Secondary | ICD-10-CM

## 2019-12-21 DIAGNOSIS — O321XX0 Maternal care for breech presentation, not applicable or unspecified: Secondary | ICD-10-CM | POA: Diagnosis present

## 2019-12-21 DIAGNOSIS — O134 Gestational [pregnancy-induced] hypertension without significant proteinuria, complicating childbirth: Principal | ICD-10-CM | POA: Diagnosis present

## 2019-12-21 DIAGNOSIS — O0990 Supervision of high risk pregnancy, unspecified, unspecified trimester: Secondary | ICD-10-CM

## 2019-12-21 DIAGNOSIS — O24420 Gestational diabetes mellitus in childbirth, diet controlled: Secondary | ICD-10-CM | POA: Diagnosis present

## 2019-12-21 DIAGNOSIS — Z91040 Latex allergy status: Secondary | ICD-10-CM

## 2019-12-21 DIAGNOSIS — Z87891 Personal history of nicotine dependence: Secondary | ICD-10-CM | POA: Diagnosis not present

## 2019-12-21 DIAGNOSIS — O09899 Supervision of other high risk pregnancies, unspecified trimester: Secondary | ICD-10-CM

## 2019-12-21 DIAGNOSIS — O99210 Obesity complicating pregnancy, unspecified trimester: Secondary | ICD-10-CM | POA: Diagnosis present

## 2019-12-21 DIAGNOSIS — Z3A38 38 weeks gestation of pregnancy: Secondary | ICD-10-CM

## 2019-12-21 DIAGNOSIS — A4902 Methicillin resistant Staphylococcus aureus infection, unspecified site: Secondary | ICD-10-CM | POA: Diagnosis present

## 2019-12-21 DIAGNOSIS — O99214 Obesity complicating childbirth: Secondary | ICD-10-CM | POA: Diagnosis present

## 2019-12-21 DIAGNOSIS — O0993 Supervision of high risk pregnancy, unspecified, third trimester: Secondary | ICD-10-CM

## 2019-12-21 DIAGNOSIS — O24410 Gestational diabetes mellitus in pregnancy, diet controlled: Secondary | ICD-10-CM

## 2019-12-21 DIAGNOSIS — O24419 Gestational diabetes mellitus in pregnancy, unspecified control: Secondary | ICD-10-CM | POA: Diagnosis present

## 2019-12-21 DIAGNOSIS — Z20822 Contact with and (suspected) exposure to covid-19: Secondary | ICD-10-CM | POA: Diagnosis present

## 2019-12-21 LAB — RESP PANEL BY RT-PCR (FLU A&B, COVID) ARPGX2
Influenza A by PCR: NEGATIVE
Influenza B by PCR: NEGATIVE
SARS Coronavirus 2 by RT PCR: NEGATIVE

## 2019-12-21 LAB — COMPREHENSIVE METABOLIC PANEL
ALT: 15 U/L (ref 0–44)
AST: 20 U/L (ref 15–41)
Albumin: 2.6 g/dL — ABNORMAL LOW (ref 3.5–5.0)
Alkaline Phosphatase: 214 U/L — ABNORMAL HIGH (ref 38–126)
Anion gap: 12 (ref 5–15)
BUN: 7 mg/dL (ref 6–20)
CO2: 23 mmol/L (ref 22–32)
Calcium: 9.5 mg/dL (ref 8.9–10.3)
Chloride: 101 mmol/L (ref 98–111)
Creatinine, Ser: 1.05 mg/dL — ABNORMAL HIGH (ref 0.44–1.00)
GFR, Estimated: >60 mL/min (ref >60)
Glucose, Bld: 81 mg/dL (ref 70–99)
Potassium: 4.1 mmol/L (ref 3.5–5.1)
Sodium: 136 mmol/L (ref 135–145)
Total Bilirubin: 0.5 mg/dL (ref 0.3–1.2)
Total Protein: 6.1 g/dL — ABNORMAL LOW (ref 6.5–8.1)

## 2019-12-21 LAB — CBC
HCT: 35.7 % — ABNORMAL LOW (ref 36.0–46.0)
Hemoglobin: 11.5 g/dL — ABNORMAL LOW (ref 12.0–15.0)
MCH: 28.1 pg (ref 26.0–34.0)
MCHC: 32.2 g/dL (ref 30.0–36.0)
MCV: 87.3 fL (ref 80.0–100.0)
Platelets: 315 10*3/uL (ref 150–400)
RBC: 4.09 MIL/uL (ref 3.87–5.11)
RDW: 14.6 % (ref 11.5–15.5)
WBC: 7.4 10*3/uL (ref 4.0–10.5)
nRBC: 0 % (ref 0.0–0.2)

## 2019-12-21 LAB — PROTEIN / CREATININE RATIO, URINE
Creatinine, Urine: 130.45 mg/dL
Protein Creatinine Ratio: 0.17 mg/mg{Cre} — ABNORMAL HIGH (ref 0.00–0.15)
Total Protein, Urine: 22 mg/dL

## 2019-12-21 LAB — TYPE AND SCREEN
ABO/RH(D): O POS
Antibody Screen: NEGATIVE

## 2019-12-21 LAB — GLUCOSE, CAPILLARY: Glucose-Capillary: 82 mg/dL (ref 70–99)

## 2019-12-21 SURGERY — Surgical Case
Anesthesia: Choice

## 2019-12-21 SURGERY — Surgical Case
Anesthesia: Spinal

## 2019-12-21 MED ORDER — DIPHENHYDRAMINE HCL 25 MG PO CAPS: 25.0000 mg | ORAL_CAPSULE | Freq: Four times a day (QID) | ORAL | Status: DC | PRN

## 2019-12-21 MED ORDER — WITCH HAZEL-GLYCERIN EX PADS: 1.0000 "application " | MEDICATED_PAD | CUTANEOUS | Status: DC | PRN

## 2019-12-21 MED ORDER — OXYTOCIN-SODIUM CHLORIDE 30-0.9 UT/500ML-% IV SOLN: 2.5000 [IU]/h | INTRAVENOUS | Status: AC

## 2019-12-21 MED ORDER — OXYTOCIN-SODIUM CHLORIDE 30-0.9 UT/500ML-% IV SOLN
INTRAVENOUS | Status: DC | PRN
  Administered 2019-12-21: 300 mL via INTRAVENOUS

## 2019-12-21 MED ORDER — DEXAMETHASONE SODIUM PHOSPHATE 10 MG/ML IJ SOLN
INTRAMUSCULAR | Status: AC
  Filled 2019-12-21: qty 1

## 2019-12-21 MED ORDER — SIMETHICONE 80 MG PO CHEW: 80.0000 mg | CHEWABLE_TABLET | ORAL | Status: DC | PRN

## 2019-12-21 MED ORDER — CEFAZOLIN SODIUM-DEXTROSE 2-4 GM/100ML-% IV SOLN
2.0000 g | INTRAVENOUS | Status: AC
  Administered 2019-12-21: 2 g via INTRAVENOUS
  Filled 2019-12-21: qty 100

## 2019-12-21 MED ORDER — OXYCODONE HCL 5 MG PO TABS: 5.0000 mg | ORAL_TABLET | Freq: Once | ORAL | Status: DC | PRN

## 2019-12-21 MED ORDER — LACTATED RINGERS IV SOLN: INTRAVENOUS | Status: DC | PRN

## 2019-12-21 MED ORDER — SODIUM CHLORIDE 0.9% FLUSH: 3.0000 mL | INTRAVENOUS | Status: DC | PRN

## 2019-12-21 MED ORDER — FENTANYL CITRATE (PF) 100 MCG/2ML IJ SOLN: 25.0000 ug | INTRAMUSCULAR | Status: DC | PRN

## 2019-12-21 MED ORDER — SIMETHICONE 80 MG PO CHEW
80.0000 mg | CHEWABLE_TABLET | Freq: Three times a day (TID) | ORAL | Status: DC
  Administered 2019-12-22 – 2019-12-23 (×4): 80 mg via ORAL
  Filled 2019-12-21 (×4): qty 1

## 2019-12-21 MED ORDER — PRENATAL MULTIVITAMIN CH
1.0000 | ORAL_TABLET | Freq: Every day | ORAL | Status: DC
  Administered 2019-12-22 – 2019-12-23 (×2): 1 via ORAL
  Filled 2019-12-21 (×2): qty 1

## 2019-12-21 MED ORDER — OXYCODONE HCL 5 MG/5ML PO SOLN: 5.0000 mg | Freq: Once | ORAL | Status: DC | PRN

## 2019-12-21 MED ORDER — BUPIVACAINE HCL (PF) 0.25 % IJ SOLN
INTRAMUSCULAR | Status: AC
  Filled 2019-12-21: qty 10

## 2019-12-21 MED ORDER — BUPIVACAINE IN DEXTROSE 0.75-8.25 % IT SOLN
INTRATHECAL | Status: DC | PRN
  Administered 2019-12-21: 1.7 mL via INTRATHECAL

## 2019-12-21 MED ORDER — PHENYLEPHRINE HCL-NACL 20-0.9 MG/250ML-% IV SOLN
INTRAVENOUS | Status: AC
  Filled 2019-12-21: qty 250

## 2019-12-21 MED ORDER — NALBUPHINE HCL 10 MG/ML IJ SOLN: 5.0000 mg | INTRAMUSCULAR | Status: DC | PRN

## 2019-12-21 MED ORDER — ONDANSETRON HCL 4 MG/2ML IJ SOLN
INTRAMUSCULAR | Status: AC
  Filled 2019-12-21: qty 2

## 2019-12-21 MED ORDER — IBUPROFEN 800 MG PO TABS
800.0000 mg | ORAL_TABLET | Freq: Three times a day (TID) | ORAL | Status: DC
  Administered 2019-12-21 – 2019-12-23 (×5): 800 mg via ORAL
  Filled 2019-12-21 (×6): qty 1

## 2019-12-21 MED ORDER — NALBUPHINE HCL 10 MG/ML IJ SOLN: 5.0000 mg | Freq: Once | INTRAMUSCULAR | Status: DC | PRN

## 2019-12-21 MED ORDER — POVIDONE-IODINE 10 % EX SWAB
2.0000 "application " | Freq: Once | CUTANEOUS | Status: AC
  Administered 2019-12-21: 2 via TOPICAL

## 2019-12-21 MED ORDER — INFLUENZA VAC SPLIT QUAD 0.5 ML IM SUSY: 0.5000 mL | PREFILLED_SYRINGE | INTRAMUSCULAR | Status: DC

## 2019-12-21 MED ORDER — MORPHINE SULFATE (PF) 0.5 MG/ML IJ SOLN
INTRAMUSCULAR | Status: AC
  Filled 2019-12-21: qty 10

## 2019-12-21 MED ORDER — FENTANYL CITRATE (PF) 100 MCG/2ML IJ SOLN
INTRAMUSCULAR | Status: DC | PRN
  Administered 2019-12-21: 15 ug via INTRATHECAL

## 2019-12-21 MED ORDER — MENTHOL 3 MG MT LOZG: 1.0000 | LOZENGE | OROMUCOSAL | Status: DC | PRN

## 2019-12-21 MED ORDER — DEXAMETHASONE SODIUM PHOSPHATE 10 MG/ML IJ SOLN
INTRAMUSCULAR | Status: DC | PRN
  Administered 2019-12-21: 10 mg via INTRAVENOUS

## 2019-12-21 MED ORDER — ONDANSETRON HCL 4 MG/2ML IJ SOLN: 4.0000 mg | Freq: Three times a day (TID) | INTRAMUSCULAR | Status: DC | PRN

## 2019-12-21 MED ORDER — ACETAMINOPHEN 500 MG PO TABS
1000.0000 mg | ORAL_TABLET | Freq: Once | ORAL | Status: AC
  Administered 2019-12-21: 1000 mg via ORAL
  Filled 2019-12-21: qty 2

## 2019-12-21 MED ORDER — ENOXAPARIN SODIUM 60 MG/0.6ML ~~LOC~~ SOLN
60.0000 mg | SUBCUTANEOUS | Status: DC
  Administered 2019-12-22 – 2019-12-23 (×2): 60 mg via SUBCUTANEOUS
  Filled 2019-12-21 (×2): qty 0.6

## 2019-12-21 MED ORDER — ACETAMINOPHEN 500 MG PO TABS
1000.0000 mg | ORAL_TABLET | Freq: Four times a day (QID) | ORAL | Status: DC
  Administered 2019-12-21 – 2019-12-23 (×6): 1000 mg via ORAL
  Filled 2019-12-21 (×8): qty 2

## 2019-12-21 MED ORDER — NALOXONE HCL 4 MG/10ML IJ SOLN
1.0000 ug/kg/h | INTRAVENOUS | Status: DC | PRN
  Filled 2019-12-21: qty 5

## 2019-12-21 MED ORDER — KETOROLAC TROMETHAMINE 30 MG/ML IJ SOLN
INTRAMUSCULAR | Status: AC
  Filled 2019-12-21: qty 1

## 2019-12-21 MED ORDER — SOD CITRATE-CITRIC ACID 500-334 MG/5ML PO SOLN
30.0000 mL | ORAL | Status: AC
  Administered 2019-12-21: 30 mL via ORAL
  Filled 2019-12-21: qty 15

## 2019-12-21 MED ORDER — NALOXONE HCL 0.4 MG/ML IJ SOLN: 0.4000 mg | INTRAMUSCULAR | Status: DC | PRN

## 2019-12-21 MED ORDER — MEPERIDINE HCL 25 MG/ML IJ SOLN: 6.2500 mg | INTRAMUSCULAR | Status: DC | PRN

## 2019-12-21 MED ORDER — DIBUCAINE (PERIANAL) 1 % EX OINT: 1.0000 "application " | TOPICAL_OINTMENT | CUTANEOUS | Status: DC | PRN

## 2019-12-21 MED ORDER — ONDANSETRON HCL 4 MG/2ML IJ SOLN
INTRAMUSCULAR | Status: DC | PRN
  Administered 2019-12-21: 4 mg via INTRAVENOUS

## 2019-12-21 MED ORDER — KETOROLAC TROMETHAMINE 30 MG/ML IJ SOLN
30.0000 mg | Freq: Four times a day (QID) | INTRAMUSCULAR | Status: DC | PRN
  Administered 2019-12-21: 30 mg via INTRAVENOUS

## 2019-12-21 MED ORDER — SCOPOLAMINE 1 MG/3DAYS TD PT72
MEDICATED_PATCH | TRANSDERMAL | Status: AC
  Filled 2019-12-21: qty 1

## 2019-12-21 MED ORDER — SCOPOLAMINE 1 MG/3DAYS TD PT72
1.0000 | MEDICATED_PATCH | Freq: Once | TRANSDERMAL | Status: DC
  Administered 2019-12-21: 1.5 mg via TRANSDERMAL

## 2019-12-21 MED ORDER — PHENYLEPHRINE HCL-NACL 20-0.9 MG/250ML-% IV SOLN
INTRAVENOUS | Status: DC | PRN
  Administered 2019-12-21: 60 ug/min via INTRAVENOUS

## 2019-12-21 MED ORDER — KETOROLAC TROMETHAMINE 30 MG/ML IJ SOLN: 30.0000 mg | Freq: Four times a day (QID) | INTRAMUSCULAR | Status: DC | PRN

## 2019-12-21 MED ORDER — ONDANSETRON HCL 4 MG/2ML IJ SOLN
4.0000 mg | Freq: Four times a day (QID) | INTRAMUSCULAR | Status: AC | PRN
  Administered 2019-12-21: 4 mg via INTRAVENOUS

## 2019-12-21 MED ORDER — FENTANYL CITRATE (PF) 100 MCG/2ML IJ SOLN
INTRAMUSCULAR | Status: AC
  Filled 2019-12-21: qty 2

## 2019-12-21 MED ORDER — MORPHINE SULFATE (PF) 0.5 MG/ML IJ SOLN
INTRAMUSCULAR | Status: DC | PRN
  Administered 2019-12-21: 150 ug via INTRATHECAL

## 2019-12-21 MED ORDER — BUPIVACAINE HCL 0.25 % IJ SOLN
INTRAMUSCULAR | Status: DC | PRN
  Administered 2019-12-21: 30 mL

## 2019-12-21 MED ORDER — DIPHENHYDRAMINE HCL 25 MG PO CAPS: 25.0000 mg | ORAL_CAPSULE | ORAL | Status: DC | PRN

## 2019-12-21 MED ORDER — DIPHENHYDRAMINE HCL 50 MG/ML IJ SOLN: 12.5000 mg | INTRAMUSCULAR | Status: DC | PRN

## 2019-12-21 MED ORDER — OXYCODONE HCL 5 MG PO TABS
5.0000 mg | ORAL_TABLET | ORAL | Status: DC | PRN
  Administered 2019-12-22 (×2): 5 mg via ORAL
  Administered 2019-12-22: 10 mg via ORAL
  Administered 2019-12-23 (×2): 5 mg via ORAL
  Filled 2019-12-21 (×2): qty 1
  Filled 2019-12-21: qty 2
  Filled 2019-12-21 (×2): qty 1

## 2019-12-21 MED ORDER — PHENYLEPHRINE 40 MCG/ML (10ML) SYRINGE FOR IV PUSH (FOR BLOOD PRESSURE SUPPORT)
PREFILLED_SYRINGE | INTRAVENOUS | Status: DC | PRN
  Administered 2019-12-21: 120 ug via INTRAVENOUS

## 2019-12-21 MED ORDER — OXYTOCIN-SODIUM CHLORIDE 30-0.9 UT/500ML-% IV SOLN
INTRAVENOUS | Status: AC
  Filled 2019-12-21: qty 500

## 2019-12-21 MED ORDER — SIMETHICONE 80 MG PO CHEW
80.0000 mg | CHEWABLE_TABLET | ORAL | Status: DC
  Administered 2019-12-21 – 2019-12-22 (×2): 80 mg via ORAL
  Filled 2019-12-21 (×2): qty 1

## 2019-12-21 MED ORDER — SENNOSIDES-DOCUSATE SODIUM 8.6-50 MG PO TABS
2.0000 | ORAL_TABLET | ORAL | Status: DC
  Administered 2019-12-21 – 2019-12-22 (×2): 2 via ORAL
  Filled 2019-12-21 (×2): qty 2

## 2019-12-21 MED ORDER — COCONUT OIL OIL: 1.0000 "application " | TOPICAL_OIL | Status: DC | PRN

## 2019-12-21 MED ORDER — TETANUS-DIPHTH-ACELL PERTUSSIS 5-2.5-18.5 LF-MCG/0.5 IM SUSY: 0.5000 mL | PREFILLED_SYRINGE | Freq: Once | INTRAMUSCULAR | Status: DC

## 2019-12-21 SURGICAL SUPPLY — 34 items
BENZOIN TINCTURE PRP APPL 2/3 (GAUZE/BANDAGES/DRESSINGS) ×3 IMPLANT
CLAMP CORD UMBIL (MISCELLANEOUS) ×3 IMPLANT
CLOSURE WOUND 1/2 X4 (GAUZE/BANDAGES/DRESSINGS) ×1
CLOTH BEACON ORANGE TIMEOUT ST (SAFETY) ×3 IMPLANT
DRSG OPSITE POSTOP 4X10 (GAUZE/BANDAGES/DRESSINGS) ×3 IMPLANT
DRSG PAD ABDOMINAL 8X10 ST (GAUZE/BANDAGES/DRESSINGS) ×3 IMPLANT
ELECT REM PT RETURN 9FT ADLT (ELECTROSURGICAL) ×3
ELECTRODE REM PT RTRN 9FT ADLT (ELECTROSURGICAL) ×1 IMPLANT
EXTRACTOR VACUUM M CUP 4 TUBE (SUCTIONS) IMPLANT
EXTRACTOR VACUUM M CUP 4' TUBE (SUCTIONS)
GLOVE BIOGEL PI IND STRL 7.0 (GLOVE) ×3 IMPLANT
GLOVE BIOGEL PI INDICATOR 7.0 (GLOVE) ×6
GLOVE ECLIPSE 7.0 STRL STRAW (GLOVE) ×3 IMPLANT
GOWN STRL REUS W/TWL LRG LVL3 (GOWN DISPOSABLE) ×6 IMPLANT
KIT ABG SYR 3ML LUER SLIP (SYRINGE) ×3 IMPLANT
NEEDLE HYPO 22GX1.5 SAFETY (NEEDLE) ×3 IMPLANT
NEEDLE HYPO 25X5/8 SAFETYGLIDE (NEEDLE) ×3 IMPLANT
NS IRRIG 1000ML POUR BTL (IV SOLUTION) ×3 IMPLANT
PACK C SECTION WH (CUSTOM PROCEDURE TRAY) ×3 IMPLANT
PAD ABD 7.5X8 STRL (GAUZE/BANDAGES/DRESSINGS) ×6 IMPLANT
PAD OB MATERNITY 4.3X12.25 (PERSONAL CARE ITEMS) ×3 IMPLANT
PENCIL SMOKE EVAC W/HOLSTER (ELECTROSURGICAL) ×3 IMPLANT
RTRCTR C-SECT PINK 25CM LRG (MISCELLANEOUS) ×3 IMPLANT
STRIP CLOSURE SKIN 1/2X4 (GAUZE/BANDAGES/DRESSINGS) ×2 IMPLANT
STRIP SURGICAL 1/2 X 6 IN (GAUZE/BANDAGES/DRESSINGS) ×3 IMPLANT
SUT MNCRL 0 VIOLET CTX 36 (SUTURE) ×2 IMPLANT
SUT MONOCRYL 0 CTX 36 (SUTURE) ×4
SUT VIC AB 0 CTX 36 (SUTURE) ×2
SUT VIC AB 0 CTX36XBRD ANBCTRL (SUTURE) ×1 IMPLANT
SUT VIC AB 4-0 KS 27 (SUTURE) ×3 IMPLANT
SYR 30ML LL (SYRINGE) ×3 IMPLANT
TOWEL OR 17X24 6PK STRL BLUE (TOWEL DISPOSABLE) ×3 IMPLANT
TRAY FOLEY W/BAG SLVR 14FR LF (SET/KITS/TRAYS/PACK) ×3 IMPLANT
WATER STERILE IRR 1000ML POUR (IV SOLUTION) ×3 IMPLANT

## 2019-12-21 NOTE — Anesthesia Procedure Notes (Signed)
Spinal  Patient location during procedure: OR Start time: 12/21/2019 4:05 PM End time: 12/21/2019 4:07 PM Staffing Performed: anesthesiologist  Anesthesiologist: Achille Rich, MD Preanesthetic Checklist Completed: patient identified, IV checked, risks and benefits discussed, surgical consent, monitors and equipment checked, pre-op evaluation and timeout performed Spinal Block Patient position: sitting Prep: DuraPrep Patient monitoring: cardiac monitor, continuous pulse ox and blood pressure Approach: midline Location: L3-4 Injection technique: single-shot Needle Needle type: Pencan  Needle gauge: 24 G Needle length: 9 cm Assessment Sensory level: T10 Additional Notes Functioning IV was confirmed and monitors were applied. Sterile prep and drape, including hand hygiene and sterile gloves were used. The patient was positioned and the spine was prepped. The skin was anesthetized with lidocaine.  Free flow of clear CSF was obtained prior to injecting local anesthetic into the CSF.  The spinal needle aspirated freely following injection.  The needle was carefully withdrawn.  The patient tolerated the procedure well.

## 2019-12-21 NOTE — Progress Notes (Signed)
   PRENATAL VISIT NOTE  Subjective:  Brooke Howell is a 29 y.o. G4P0030 at [redacted]w[redacted]d being seen today for ongoing prenatal care.  She is currently monitored for the following issues for this high-risk pregnancy and has Supervision of high risk pregnancy, antepartum; Obesity affecting pregnancy, antepartum; Short interval between pregnancies affecting pregnancy, antepartum; History of maternal syphilis, currently pregnant; Staphylococcus aureus, methicillin-resistant; and GDM (gestational diabetes mellitus) on their problem list.  Patient reports elevated BP today, denies h/o cHTN. Denies RUQ pain, vision changes, occasional HAs, nothing severe.  Contractions: Not present. Vag. Bleeding: None.  Movement: Present. Denies leaking of fluid. Declined repeat version, desires C/S. Scheduled on 12/12.    The following portions of the patient's history were reviewed and updated as appropriate: allergies, current medications, past family history, past medical history, past social history, past surgical history and problem list.   Objective:   Vitals:   12/21/19 1034  BP: 140/87  Pulse: 94  Weight: 260 lb 4.8 oz (118.1 kg)    Fetal Status: Fetal Heart Rate (bpm): 154   Movement: Present     General:  Alert, oriented and cooperative. Patient is in no acute distress.  Skin: Skin is warm and dry. No rash noted.   Cardiovascular: Normal heart rate noted  Respiratory: Normal respiratory effort, no problems with respiration noted  Abdomen: Soft, gravid, appropriate for gestational age.  Pain/Pressure: Present     Pelvic: Cervical exam performed in the presence of a chaperone        Extremities: Normal range of motion.  Edema: Trace  Mental Status: Normal mood and affect. Normal behavior. Normal judgment and thought content.   Assessment and Plan:  Pregnancy: G4P0030 at [redacted]w[redacted]d 1. Diet controlled gestational diabetes mellitus (GDM) in third trimester Good control  2. Supervision of high risk pregnancy  in third trimester Breech, failed version, declines repeat attempt. C/S scheduled.   3. Elevated blood pressure affecting pregnancy in third trimester, antepartum Asx, Mild range BP will send to MAU for workup. Risks of pre eclampsia discussed.   Term labor symptoms and general obstetric precautions including but not limited to vaginal bleeding, contractions, leaking of fluid and fetal movement were reviewed in detail with the patient. Please refer to After Visit Summary for other counseling recommendations.   No follow-ups on file.  Future Appointments  Date Time Provider Department Center  12/22/2019  9:15 AM [facility]ENING [facility] None  12/22/2019 10:00 AM [facility] PAT 1 [facility] None    Malachy Chamber, MD

## 2019-12-21 NOTE — Discharge Summary (Signed)
Postpartum Discharge Summary  Date of Service updated    Patient Name: Brooke Howell DOB: 09/20/1990 MRN: [id number]  Date of admission: 12/21/2019 Delivery date:12/21/2019  Delivering provider: Donnamae Jude  Date of discharge: 12/23/2019  Admitting diagnosis: Supervision of high-risk pregnancy [O09.90] Intrauterine pregnancy: [redacted]w[redacted]d     Secondary diagnosis:  Principal Problem:   Cesarean delivery delivered Active Problems:   Obesity affecting pregnancy, antepartum   Short interval between pregnancies affecting pregnancy, antepartum   History of maternal syphilis, currently pregnant   Staphylococcus aureus, methicillin-resistant   GDM (gestational diabetes mellitus)   Supervision of high-risk pregnancy  Additional problems: as noted above   Discharge diagnosis: primary Cesarean s/p failed ECV on 12/2                                     Post partum procedures:none Augmentation: N/A Complications: None  Hospital course: Sceduled C/S   29 y.o. yo [id number] at [redacted]w[redacted]d was admitted to the hospital 12/21/2019 for cesarean section given new diagnosis of gHTN in the setting of persistent fetal breech presentation s/p failed ECV on 12/14/19. Delivery details are as follows:  Membrane Rupture Time/Date: 4:27 PM ,12/21/2019   Delivery Method:C-Section, Low Transverse  Details of operation can be found in separate operative note.  Patient had an uncomplicated postpartum course.  She is ambulating, tolerating a regular diet, passing flatus, and urinating well. Patient is discharged home in stable condition on  12/23/19.        Newborn Data: Birth date:12/21/2019  Birth time:4:27 PM  Gender:Female  Living status:Living  Apgars:8 ,9  Weight:3220 g     Magnesium Sulfate received: No BMZ received: No Rhophylac:N/A MMR:N/A T-DaP:Given prenatally Flu: No Transfusion:No  Physical exam  Vitals:   12/22/19 0500 12/22/19 1507 12/22/19 2358 12/23/19 1614  BP:  101/65 128/66 139/88  Pulse:   (!) 108 98 85  Resp: $Remo'16 17 18 18  'UFbQk$ Temp:  98.3 F (36.8 C) 98.1 F (36.7 C) 98.4 F (36.9 C)  TempSrc:  Oral Oral   SpO2:  99%  99%   General: refer to progress note Lochia: refer to progress note Uterine Fundus: refer to progress note Incision: refer to progress note DVT Evaluation: refer to progress note Labs: Lab Results  Component Value Date   WBC 13.6 (H) 12/22/2019   HGB 10.7 (L) 12/22/2019   HCT 32.5 (L) 12/22/2019   MCV 86.9 12/22/2019   PLT 304 12/22/2019   CMP Latest Ref Rng & Units 12/21/2019  Glucose 70 - 99 mg/dL 81  BUN 6 - 20 mg/dL 7  Creatinine 0.44 - 1.00 mg/dL 1.05(H)  Sodium 135 - 145 mmol/L 136  Potassium 3.5 - 5.1 mmol/L 4.1  Chloride 98 - 111 mmol/L 101  CO2 22 - 32 mmol/L 23  Calcium 8.9 - 10.3 mg/dL 9.5  Total Protein 6.5 - 8.1 g/dL 6.1(L)  Total Bilirubin 0.3 - 1.2 mg/dL 0.5  Alkaline Phos 38 - 126 U/L 214(H)  AST 15 - 41 U/L 20  ALT 0 - 44 U/L 15   Edinburgh Score: Edinburgh Postnatal Depression Scale Screening Tool 12/23/2019  I have been able to laugh and see the funny side of things. 0  I have looked forward with enjoyment to things. 0  I have blamed myself unnecessarily when things went wrong. 0  I have been anxious or worried for no good reason. 0  I have  felt scared or panicky for no good reason. 1  Things have been getting on top of me. 0  I have been so unhappy that I have had difficulty sleeping. 0  I have felt sad or miserable. 0  I have been so unhappy that I have been crying. 0  The thought of harming myself has occurred to me. 0  Edinburgh Postnatal Depression Scale Total 1     After visit meds:  Allergies as of 12/23/2019      Reactions   Latex Itching, Swelling   Shellfish Allergy Itching, Swelling      Medication List    STOP taking these medications   Accu-Chek Guide test strip Generic drug: glucose blood   Accu-Chek Softclix Lancets lancets   aspirin EC 81 MG tablet   Blood Pressure Kit Devi   Comfort  Fit Maternity Supp Lg Misc   famotidine 20 MG tablet Commonly known as: Pepcid   ondansetron 4 MG disintegrating tablet Commonly known as: Zofran ODT   polyethylene glycol 17 g packet Commonly known as: MiraLax     TAKE these medications   ibuprofen 600 MG tablet Commonly known as: ADVIL Take 1 tablet (600 mg total) by mouth every 6 (six) hours as needed.   norethindrone 0.35 MG tablet Commonly known as: Ortho Micronor Take 1 tablet (0.35 mg total) by mouth daily.   PRENATAL GUMMIES/DHA & FA PO Take 1 tablet by mouth daily.   VITAMIN C PO Take 1 tablet by mouth daily.        Discharge home in stable condition Infant Feeding: breast and bottle Infant Disposition:home with mother Discharge instruction: per After Visit Summary and Postpartum booklet. Activity: Advance as tolerated. Pelvic rest for 6 weeks.  Diet: routine diet Future Appointments: No future appointments. Follow up Visit: Message sent to [facility] on 12/22/19 to schedule PP appt.  Please schedule this patient for a In person postpartum visit in 4 weeks with the following provider: Any provider. Additional Postpartum F/U:BP check 1 week & Incision check 1 week Low risk pregnancy complicated by: fetal breech presentation s/p failed ECV, gHTN (diagnosed on admission), h/o remote syphilis Delivery mode:  C-Section, Low Transverse  Anticipated Birth Control:  POPs   12/23/2019 Renee Harder, CNM

## 2019-12-21 NOTE — Telephone Encounter (Signed)
Attempted to call pt from arequest on her MyChart account.  Voicemail is full and pt is in MAU awaiting CS.
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
## 2019-12-21 NOTE — Anesthesia Preprocedure Evaluation (Signed)
Anesthesia Evaluation  Patient identified by MRN, date of birth, ID band Patient awake    Reviewed: Allergy & Precautions, H&P , NPO status , Patient's Chart, lab work & pertinent test results  Airway Mallampati: II   Neck ROM: full    Dental   Pulmonary former smoker,    breath sounds clear to auscultation       Cardiovascular negative cardio ROS   Rhythm:regular Rate:Normal     Neuro/Psych    GI/Hepatic   Endo/Other  diabetes, Gestational  Renal/GU      Musculoskeletal   Abdominal   Peds  Hematology   Anesthesia Other Findings   Reproductive/Obstetrics                             Anesthesia Physical Anesthesia Plan  ASA: II  Anesthesia Plan: Spinal   Post-op Pain Management:    Induction: Intravenous  PONV Risk Score and Plan: 2 and Ondansetron, Dexamethasone and Treatment may vary due to age or medical condition  Airway Management Planned: Simple Face Mask  Additional Equipment:   Intra-op Plan:   Post-operative Plan:   Informed Consent: I have reviewed the patients History and Physical, chart, labs and discussed the procedure including the risks, benefits and alternatives for the proposed anesthesia with the patient or authorized representative who has indicated his/her understanding and acceptance.       Plan Discussed with: CRNA, Anesthesiologist and Surgeon  Anesthesia Plan Comments:         Anesthesia Quick Evaluation  
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
## 2019-12-21 NOTE — Discharge Instructions (Signed)

## 2019-12-21 NOTE — MAU Note (Signed)
Report called to [facility] or Charge Nurse and OB Anesthesiologist.

## 2019-12-21 NOTE — Op Note (Signed)
Preoperative Diagnosis:  IUP @ [redacted]w[redacted]d, breech presentation s/p failed ECV on 12/14/19, gHTN  Postoperative Diagnosis:  Same  Procedure: primary low transverse cesarean section  Surgeon: Tinnie Gens, M.D.  Assistant: Lynnda Shields, MD  Findings: Viable female infant, APGAR (1 MIN): 8   APGAR (5 MINS): 9   Weight 3220g, breech presentation  Estimated blood loss: 306 ml  Complications: None known  Specimens: Placenta to labor and delivery  Reason for procedure: Briefly, the patient is a 29 y.o. [id number] [redacted]w[redacted]d who presented for   Procedure: Patient is a to the OR where spinal analgesia was administered. She was then placed in a supine position with left lateral tilt. She received 2 g of Ancef and SCDs were in place. A timeout was performed. She was prepped and draped in the usual sterile fashion. A Foley catheter was placed in the bladder. A knife was then used to make a Pfannenstiel incision. This incision was carried out to underlying fascia which was divided in the midline with the knife. The incision was extended laterally, sharply.  The rectus was divided in the midline.  The peritoneal cavity was entered bluntly.  Alexis retractor was placed inside the incision.  A knife was used to make a low transverse incision on the uterus. This incision was carried down to the amniotic cavity was entered. Fetus was in breech position and was brought up out of the incision without difficulty with use of breech maneuvers. Cord was clamped x 2 and cut. Infant taken to waiting nurse.  Cord blood was obtained. Placenta was delivered from the uterus.  Uterus was cleaned with dry lap pads. Uterine incision closed with 0 Monocryl suture in a locked running fashion. A second layer of 0 Monocryl in an imbricating fashion was used to achieve hemostasis. Alexis retractor was removed from the abdomen. Peritoneal closure was done with 0 Monocryl suture.  Fascia is closed with 0 Vicryl suture in a running fashion.  Subcutaneous tissue infused with 30cc 0.25% Marcaine. Skin closed using 4-0 Vicryl on a Keith needle.  Steri strips applied, followed by pressure dressing.  All instrument, needle and lap counts were correct x 2.  Patient was awake and taken to PACU stable.  Infant remained with mom in couplet care, stable.   Sheila Oats, MD OB Fellow, Faculty Practice 12/21/2019 10:24 PM

## 2019-12-21 NOTE — Transfer of Care (Signed)
Immediate Anesthesia Transfer of Care Note  Patient: Brooke Howell  Procedure(s) Performed: CESAREAN SECTION  Patient Location: PACU  Anesthesia Type:Spinal  Level of Consciousness: awake  Airway & Oxygen Therapy: Patient Spontanous Breathing  Post-op Assessment: Report given to RN  Post vital signs: Reviewed and stable  Last Vitals:  Vitals Value Taken Time  BP    Temp    Pulse    Resp    SpO2      Last Pain:  Vitals:   12/21/19 1306  TempSrc:   PainSc: 3          Complications: No complications documented.

## 2019-12-21 NOTE — Anesthesia Preprocedure Evaluation (Signed)
Anesthesia Evaluation  Patient identified by MRN, date of birth, ID band Patient awake    Reviewed: Allergy & Precautions, H&P , NPO status , Patient's Chart, lab work & pertinent test results  Airway Mallampati: II   Neck ROM: full    Dental   Pulmonary former smoker,    breath sounds clear to auscultation       Cardiovascular negative cardio ROS   Rhythm:regular Rate:Normal     Neuro/Psych    GI/Hepatic   Endo/Other  diabetes, Gestational  Renal/GU      Musculoskeletal   Abdominal   Peds  Hematology   Anesthesia Other Findings   Reproductive/Obstetrics                             Anesthesia Physical Anesthesia Plan  ASA: II  Anesthesia Plan: Spinal   Post-op Pain Management:    Induction: Intravenous  PONV Risk Score and Plan: 2 and Ondansetron, Dexamethasone and Treatment may vary due to age or medical condition  Airway Management Planned: Simple Face Mask  Additional Equipment:   Intra-op Plan:   Post-operative Plan:   Informed Consent: I have reviewed the patients History and Physical, chart, labs and discussed the procedure including the risks, benefits and alternatives for the proposed anesthesia with the patient or authorized representative who has indicated his/her understanding and acceptance.       Plan Discussed with: CRNA, Anesthesiologist and Surgeon  Anesthesia Plan Comments:         Anesthesia Quick Evaluation

## 2019-12-22 ENCOUNTER — Other Ambulatory Visit (HOSPITAL_COMMUNITY): Payer: Medicaid Other

## 2019-12-22 ENCOUNTER — Encounter (HOSPITAL_COMMUNITY)
Admission: AD | Admit: 2019-12-22 | Discharge: 2019-12-22 | Disposition: A | Discharge status: Home / Self Care | Payer: Medicaid Other | Attending: Obstetrics and Gynecology | Admitting: Obstetrics and Gynecology

## 2019-12-22 LAB — CBC
HCT: 32.5 % — ABNORMAL LOW (ref 36.0–46.0)
Hemoglobin: 10.7 g/dL — ABNORMAL LOW (ref 12.0–15.0)
MCH: 28.6 pg (ref 26.0–34.0)
MCHC: 32.9 g/dL (ref 30.0–36.0)
MCV: 86.9 fL (ref 80.0–100.0)
Platelets: 304 10*3/uL (ref 150–400)
RBC: 3.74 MIL/uL — ABNORMAL LOW (ref 3.87–5.11)
RDW: 14.6 % (ref 11.5–15.5)
WBC: 13.6 10*3/uL — ABNORMAL HIGH (ref 4.0–10.5)
nRBC: 0 % (ref 0.0–0.2)

## 2019-12-22 LAB — RPR
RPR Ser Ql: REACTIVE — AB
RPR Titer: 1:2 {titer}

## 2019-12-22 NOTE — Progress Notes (Signed)
POSTPARTUM PROGRESS NOTE  Subjective: Brooke Howell is a 29 y.o. [id number] s/p pLTCS at [redacted]w[redacted]d.  She reports she doing well. No acute events overnight. She denies any problems with ambulating or po intake. Denies nausea or vomiting. She has not passed flatus. Pain is well controlled.  Lochia is mild without clotting.  Objective: Blood pressure 128/72, pulse 100, temperature 98.2 F (36.8 C), temperature source Oral, resp. rate 16, last menstrual period 03/17/2019, SpO2 99 %, unknown if currently breastfeeding.  Physical Exam:  General: alert, cooperative and no distress Chest: no respiratory distress Abdomen: soft, non-tender, incision site covered with clean and dry dressing  Uterine Fundus: firm and at level of umbilicus Extremities: No calf swelling or tenderness, no LE edema noted bilaterally   Recent Labs    12/21/19 1342 12/22/19 0441  HGB 11.5* 10.7*  HCT 35.7* 32.5*    Assessment/Plan: Brooke Howell is a 29 y.o. [id number] s/p pLTCS at [redacted]w[redacted]d for newly diagnosed gHTN.  Routine Postpartum Care: Doing well, pain well-controlled.  -- Continue routine care, lactation support  -- Contraception: POPs -- Feeding: both breast and bottle feeding  --gHTN: Blood pressures have been within normotensive range over the past 12 hours, most recently 128/72. --A1GDM: Most recent CBG 82 wnl. --Anemia during pregnancy: Hgb 10.7, no additional iron supplementation needed at this time.  Dispo: Plan for discharge in 1-2 days.  Reece Leader, DO 12/22/2019 7:22 AM

## 2019-12-22 NOTE — Progress Notes (Signed)
Pt ambulated to bathroom with slow and steady gait.  Denies pain.  Peri care given with instructions.  Ambulated in the hall because pt stated that she was feeling stiff. Tolerated well. Denies being light headed or dizzy. Does have sudden bouts of vomiting. Had one dose of zofran previously.  Back to bed without complaint

## 2019-12-23 MED ORDER — OXYCODONE HCL 5 MG PO TABS: 5.0000 mg | ORAL_TABLET | ORAL | 0 refills | Status: DC | PRN

## 2019-12-23 MED ORDER — AMLODIPINE BESYLATE 5 MG PO TABS: 5.0000 mg | ORAL_TABLET | Freq: Every day | ORAL | 11 refills | Status: DC

## 2019-12-23 MED ORDER — IBUPROFEN 600 MG PO TABS: 600.0000 mg | ORAL_TABLET | Freq: Four times a day (QID) | ORAL | 3 refills | Status: DC | PRN

## 2019-12-23 MED ORDER — NORETHINDRONE 0.35 MG PO TABS: 1.0000 | ORAL_TABLET | Freq: Every day | ORAL | 11 refills | Status: DC

## 2019-12-23 NOTE — Anesthesia Postprocedure Evaluation (Signed)
Anesthesia Post Note  Patient: Brooke Howell  Procedure(s) Performed: CESAREAN SECTION     Patient location during evaluation: PACU Anesthesia Type: Spinal Level of consciousness: oriented and awake and alert Pain management: pain level controlled Vital Signs Assessment: post-procedure vital signs reviewed and stable Respiratory status: spontaneous breathing, respiratory function stable and patient connected to nasal cannula oxygen Cardiovascular status: blood pressure returned to baseline and stable Postop Assessment: no headache, no backache and no apparent nausea or vomiting Anesthetic complications: no   No complications documented.  Last Vitals:  Vitals:   12/22/19 1507 12/22/19 2358  BP: 101/65 128/66  Pulse: (!) 108 98  Resp: 17 18  Temp: 36.8 C 36.7 C  SpO2: 99%     Last Pain:  Vitals:   12/23/19 0741  TempSrc:   PainSc: 0-No pain                 Naavya Postma S

## 2019-12-23 NOTE — Progress Notes (Signed)
Post Partum Day 2 (pLTCS) Subjective: no complaints, up ad lib, voiding, tolerating PO and + flatus - pt moving around well, energetic and feeling well  Objective: Blood pressure 128/66, pulse 98, temperature 98.1 F (36.7 C), temperature source Oral, resp. rate 18, last menstrual period 03/17/2019, SpO2 99 %, unknown if currently breastfeeding.  Physical Exam:  General: alert, cooperative, appears stated age and no distress Lochia: appropriate Uterine Fundus: firm Incision: healing well, no significant drainage DVT Evaluation: No evidence of DVT seen on physical exam.  Recent Labs    12/21/19 1342 12/22/19 0441  HGB 11.5* 10.7*  HCT 35.7* 32.5*    Assessment/Plan: Plan for discharge tomorrow, Breastfeeding and Contraception POPs   LOS: 2 days   Bernerd Limbo 12/23/2019, 11:30 AM

## 2019-12-24 ENCOUNTER — Encounter (HOSPITAL_COMMUNITY)
Admission: RE | Disposition: A | Discharge status: Home / Self Care | Payer: Self-pay | Source: Ambulatory Visit | Attending: Obstetrics & Gynecology

## 2019-12-24 SURGERY — Surgical Case
Anesthesia: Regional

## 2019-12-25 LAB — T.PALLIDUM AB, TOTAL: T Pallidum Abs: REACTIVE — AB

## 2020-01-01 ENCOUNTER — Ambulatory Visit (INDEPENDENT_AMBULATORY_CARE_PROVIDER_SITE_OTHER): Payer: Medicaid Other | Admitting: General Practice

## 2020-01-01 ENCOUNTER — Other Ambulatory Visit: Payer: Self-pay

## 2020-01-01 VITALS — BP 129/87 | Ht 68.0 in | Wt 237.0 lb

## 2020-01-01 DIAGNOSIS — Z5189 Encounter for other specified aftercare: Secondary | ICD-10-CM

## 2020-01-01 DIAGNOSIS — Z0130 Encounter for examination of blood pressure without abnormal findings: Secondary | ICD-10-CM

## 2020-01-01 NOTE — Progress Notes (Signed)
Patient presents to office for BP check & wound check following primary c-section on 12/9. Patient reports doing well since then. She is taking Norvasc daily as prescribed. Denies headaches, dizziness, or blurry vision. No edema noted. BP 129/87. Incision is clean, dry & intact- appears to be healing well. Wound care and signs & symptoms of infection reviewed with patient. Patient will follow up at Pp visit on 1/7.  Chase Caller RN BSN 01/01/20

## 2020-01-19 ENCOUNTER — Other Ambulatory Visit: Payer: Self-pay

## 2020-01-19 ENCOUNTER — Encounter: Payer: Self-pay | Admitting: Obstetrics and Gynecology

## 2020-01-19 ENCOUNTER — Ambulatory Visit (INDEPENDENT_AMBULATORY_CARE_PROVIDER_SITE_OTHER): Payer: Medicaid Other | Admitting: Obstetrics and Gynecology

## 2020-01-19 VITALS — BP 132/88 | HR 88 | Wt 230.7 lb

## 2020-01-19 DIAGNOSIS — Z1389 Encounter for screening for other disorder: Secondary | ICD-10-CM | POA: Diagnosis not present

## 2020-01-19 DIAGNOSIS — O09899 Supervision of other high risk pregnancies, unspecified trimester: Secondary | ICD-10-CM

## 2020-01-19 DIAGNOSIS — O24410 Gestational diabetes mellitus in pregnancy, diet controlled: Secondary | ICD-10-CM

## 2020-01-19 NOTE — Patient Instructions (Addendum)
Please return to clinic in 2 weeks to repeat your diabetes screen. You will need to fast for 8 hours prior to this appointment.  Please continue your current blood pressure medication. We will recheck your blood pressure in clinic when you return in 2 weeks.  Postpartum Care After Cesarean Delivery This sheet gives you information about how to care for yourself from the time you deliver your baby to up to 6-12 weeks after delivery (postpartum period). Your health care provider may also give you more specific instructions. If you have problems or questions, contact your health care provider. Follow these instructions at home: Medicines  Take over-the-counter and prescription medicines only as told by your health care provider.  If you were prescribed an antibiotic medicine, take it as told by your health care provider. Do not stop taking the antibiotic even if you start to feel better.  Ask your health care provider if the medicine prescribed to you: ? Requires you to avoid driving or using heavy machinery. ? Can cause constipation. You may need to take actions to prevent or treat constipation, such as:  Drink enough fluid to keep your urine pale yellow.  Take over-the-counter or prescription medicines.  Eat foods that are high in fiber, such as beans, whole grains, and fresh fruits and vegetables.  Limit foods that are high in fat and processed sugars, such as fried or sweet foods. Activity  Gradually return to your normal activities as told by your health care provider.  Avoid activities that take a lot of effort and energy (are strenuous) until approved by your health care provider. Walking at a slow to moderate pace is usually safe. Ask your health care provider what activities are safe for you. ? Do not lift anything that is heavier than your baby or 10 lb (4.5 kg) as told by your health care provider. ? Do not vacuum, climb stairs, or drive a car for as long as told by your health  care provider.  If possible, have someone help you at home until you are able to do your usual activities yourself.  Rest as much as possible. Try to rest or take naps while your baby is sleeping. Vaginal bleeding  It is normal to have vaginal bleeding (lochia) after delivery. Wear a sanitary pad to absorb vaginal bleeding and discharge. ? During the first week after delivery, the amount and appearance of lochia is often similar to a menstrual period. ? Over the next few weeks, it will gradually decrease to a dry, yellow-brown discharge. ? For most women, lochia stops completely by 4-6 weeks after delivery. Vaginal bleeding can vary from woman to woman.  Change your sanitary pads frequently. Watch for any changes in your flow, such as: ? A sudden increase in volume. ? A change in color. ? Large blood clots.  If you pass a blood clot, save it and call your health care provider to discuss. Do not flush blood clots down the toilet before you get instructions from your health care provider.  Do not use tampons or douches until your health care provider says this is safe.  If you are not breastfeeding, your period should return 6-8 weeks after delivery. If you are breastfeeding, your period may return anytime between 8 weeks after delivery and the time that you stop breastfeeding. Perineal care   If your C-section (Cesarean section) was unplanned, and you were allowed to labor and push before delivery, you may have pain, swelling, and discomfort of the  tissue between your vaginal opening and your anus (perineum). You may also have an incision in the tissue (episiotomy) or the tissue may have torn during delivery. Follow these instructions as told by your health care provider: ? Keep your perineum clean and dry as told by your health care provider. Use medicated pads and pain-relieving sprays and creams as directed. ? If you have an episiotomy or vaginal tear, check the area every day for signs  of infection. Check for:  Redness, swelling, or pain.  Fluid or blood.  Warmth.  Pus or a bad smell. ? You may be given a squirt bottle to use instead of wiping to clean the perineum area after you go to the bathroom. As you start healing, you may use the squirt bottle before wiping yourself. Make sure to wipe gently. ? To relieve pain caused by an episiotomy, vaginal tear, or hemorrhoids, try taking a warm sitz bath 2-3 times a day. A sitz bath is a warm water bath that is taken while you are sitting down. The water should only come up to your hips and should cover your buttocks. Breast care  Within the first few days after delivery, your breasts may feel heavy, full, and uncomfortable (breast engorgement). You may also have milk leaking from your breasts. Your health care provider can suggest ways to help relieve breast discomfort. Breast engorgement should go away within a few days.  If you are breastfeeding: ? Wear a bra that supports your breasts and fits you well. ? Keep your nipples clean and dry. Apply creams and ointments as told by your health care provider. ? You may need to use breast pads to absorb milk leakage. ? You may have uterine contractions every time you breastfeed for several weeks after delivery. Uterine contractions help your uterus return to its normal size. ? If you have any problems with breastfeeding, work with your health care provider or a Advertising copywriter.  If you are not breastfeeding: ? Avoid touching your breasts as this can make your breasts produce more milk. ? Wear a well-fitting bra and use cold packs to help with swelling. ? Do not squeeze out (express) milk. This causes you to make more milk. Intimacy and sexuality  Ask your health care provider when you can engage in sexual activity. This may depend on your: ? Risk of infection. ? Healing rate. ? Comfort and desire to engage in sexual activity.  You are able to get pregnant after delivery,  even if you have not had your period. If desired, talk with your health care provider about methods of family planning or birth control (contraception). Lifestyle  Do not use any products that contain nicotine or tobacco, such as cigarettes, e-cigarettes, and chewing tobacco. If you need help quitting, ask your health care provider.  Do not drink alcohol, especially if you are breastfeeding. Eating and drinking   Drink enough fluid to keep your urine pale yellow.  Eat high-fiber foods every day. These may help prevent or relieve constipation. High-fiber foods include: ? Whole grain cereals and breads. ? Brown rice. ? Beans. ? Fresh fruits and vegetables.  Take your prenatal vitamins until your postpartum checkup or until your health care provider tells you it is okay to stop. General instructions  Keep all follow-up visits for you and your baby as told by your health care provider. Most women visit their health care provider for a postpartum checkup within the first 3-6 weeks after delivery. Contact a health care  provider if you:  Feel unable to cope with the changes that a new baby brings to your life, and these feelings do not go away.  Feel unusually sad or worried.  Have breasts that are painful, hard, or turn red.  Have a fever.  Have trouble holding urine or keeping urine from leaking.  Have little or no interest in activities you used to enjoy.  Have not breastfed at all and you have not had a menstrual period for 12 weeks after delivery.  Have stopped breastfeeding and you have not had a menstrual period for 12 weeks after you stopped breastfeeding.  Have questions about caring for yourself or your baby.  Pass a blood clot from your vagina. Get help right away if you:  Have chest pain.  Have difficulty breathing.  Have sudden, severe leg pain.  Have severe pain or cramping in your abdomen.  Bleed from your vagina so much that you fill more than one sanitary  pad in one hour. Bleeding should not be heavier than your heaviest period.  Develop a severe headache.  Faint.  Have blurred vision or spots in your vision.  Have a bad-smelling vaginal discharge.  Have thoughts about hurting yourself or your baby. If you ever feel like you may hurt yourself or others, or have thoughts about taking your own life, get help right away. You can go to your nearest emergency department or call:  Your local emergency services (911 in the [place]  A suicide crisis helpline, such as the National Suicide Prevention Lifeline at 631-377-4610. This is open 24 hours a day. Summary  The period of time from when you deliver your baby to up to 6-12 weeks after delivery is called the postpartum period.  Gradually return to your normal activities as told by your health care provider.  Keep all follow-up visits for you and your baby as told by your health care provider. This information is not intended to replace advice given to you by your health care provider. Make sure you discuss any questions you have with your health care provider. Document Revised: 08/18/2017 Document Reviewed: 08/18/2017 Elsevier Patient Education  2020 [employer].

## 2020-01-19 NOTE — Progress Notes (Signed)
Post Partum Visit Note  Brooke Howell is a 30 y.o. [phone] female who presents for a postpartum visit. She is 4 weeks postpartum following a primary cesarean section.  I have fully reviewed the prenatal and intrapartum course. The delivery was at [redacted]w[redacted]d gestational weeks.  Anesthesia: spinal. Postpartum course has been uncomplicated. Pt has continued norvasc 5mg  daily. Baby is doing well. Baby is feeding by bottle - . Bleeding no bleeding. Bowel function is normal. Bladder function is normal. Patient is not sexually active. Contraception method is none. Postpartum depression screening: negative.  The pregnancy intention screening data noted above was reviewed. Potential methods of contraception were discussed. The patient elected to proceed with Abstinence (FOB incarcerated).   The following portions of the patient's history were reviewed and updated as appropriate: allergies, current medications, past family history, past medical history, past social history, past surgical history and problem list.  Review of Systems Pertinent items are noted in HPI.    Objective:  LMP 03/17/2019 (Exact Date)    General:  alert, cooperative and appears stated age   Breasts:  Not evaluated  Lungs: normal WOB  Heart:  normal rate  Abdomen: soft, nontender, low transverse incision healing well without tenderness, surrounding redness   Vulva:  not evaluated  Vagina: not evaluated  Cervix:  not evaluated  Corpus: not evaluated  Adnexa:  not evaluated  Rectal Exam: Not performed.        Assessment:    Normal postpartum exam. Pap smear not done at today's visit given up-to-date. Intermittent tobacco use but no reported substance use. Pt declined nicotine replacement and BH support today. Pt with high-normal blood pressure in setting of good adherence to norvasc 5mg  daily. No reported mood or safety concerns.  Plan:   Essential components of care per ACOG recommendations:  1.  Mood and  well being: Patient with negative depression screening today. Reviewed local resources for support.  - Patient does use tobacco. We discussed complete cessation and benefits for mom and baby. Pt motivated to cut back but declined nicotine replacement and BH support today. - hx of drug use? No    2. Infant care and feeding:  -Patient currently breastmilk feeding? No  -Social determinants of health (SDOH) reviewed in EPIC. No concerns reported.  3. Sexuality, contraception and birth spacing  Short Interval Pregnancy: - Patient does not want a pregnancy in the next year.  Desired family size is undecided.  - Reviewed forms of contraception in tiered fashion. Patient declined all forms of birth control today s/p counseling. Encouraged pt to return to clinic if interest in starting birth control in the future. - Discussed birth spacing of 18 months.  4. Sleep and fatigue -Encouraged family/partner/community support of 4 hrs of uninterrupted sleep to help with mood and fatigue  5. Physical Recovery  - Discussed patients delivery and complications; no concerns reported. - Patient did NOT have any vaginal lacerations. Patient expressed understanding - Patient has urinary incontinence? No - Patient is safe to resume physical and sexual activity  6.  Health Maintenance - Last pap smear done 06/14/18 and was normal. Plan for repeat pap in 3 years from last result.  7. Gestational Hypertension: Taking norvasc 5mg  daily with good adherence (prescribed on hospital discharge). No concerning signs/symptoms today.  Last dose of norvasc this AM prior to clinic appointment. - plan for f/u nurse visit in 2 weeks to recheck blood pressure - encouraged pt to continue regular follow-up with PCP in  the future - discussed benefits of tobacco cessation   8. A1 Gestational Diabetes:  - counseled on healthy lifestyle modifications today - plan for f/u lab visit in 2 weeks to repeat 2hr gtt  9. Chronic  Disease - PCP follow up as needed  Sheila Oats, MD OB Fellow, [facility] 01/19/2020 10:41 AM

## 2020-02-02 ENCOUNTER — Other Ambulatory Visit: Payer: Medicaid Other

## 2020-02-02 ENCOUNTER — Telehealth: Payer: Self-pay | Admitting: Family Medicine

## 2020-02-02 ENCOUNTER — Ambulatory Visit: Payer: Medicaid Other

## 2020-02-02 NOTE — Telephone Encounter (Signed)
I called pt back to reschedule today's missed appt, unable to reach pt, no voicemail avail.

## 2020-05-01 ENCOUNTER — Encounter: Payer: Self-pay | Admitting: Nurse Practitioner

## 2020-05-01 ENCOUNTER — Other Ambulatory Visit: Payer: Self-pay

## 2020-05-01 ENCOUNTER — Ambulatory Visit (INDEPENDENT_AMBULATORY_CARE_PROVIDER_SITE_OTHER): Payer: Medicaid Other | Admitting: Nurse Practitioner

## 2020-05-01 VITALS — BP 116/76

## 2020-05-01 DIAGNOSIS — R8290 Unspecified abnormal findings in urine: Secondary | ICD-10-CM | POA: Diagnosis not present

## 2020-05-01 DIAGNOSIS — Z30013 Encounter for initial prescription of injectable contraceptive: Secondary | ICD-10-CM | POA: Diagnosis not present

## 2020-05-01 DIAGNOSIS — N898 Other specified noninflammatory disorders of vagina: Secondary | ICD-10-CM | POA: Diagnosis not present

## 2020-05-01 DIAGNOSIS — Z3009 Encounter for other general counseling and advice on contraception: Secondary | ICD-10-CM | POA: Diagnosis not present

## 2020-05-01 LAB — WET PREP FOR TRICH, YEAST, CLUE

## 2020-05-01 MED ORDER — MEDROXYPROGESTERONE ACETATE 150 MG/ML IM SUSP
150.0000 mg | Freq: Once | INTRAMUSCULAR | Status: AC
  Administered 2020-05-01: 150 mg via INTRAMUSCULAR

## 2020-05-01 NOTE — Progress Notes (Signed)
   Acute Office Visit  Subjective:    Patient ID: Brooke Howell, female    DOB: 27-Jan-1990, 30 y.o.   MRN: [id number]   HPI 30 y.o. presents today for malodorous urine and vaginal discharge. Has not been sexually active since delivery in December. She had a little girl by C-section. Would like to restart Depo Provera. She was on this for 9 years in the past. She does not want pill due to not remembering to take, she has tried patch and Nexplanon, and is not interested in IUD.    Review of Systems  Constitutional: Negative.   Genitourinary: Positive for vaginal discharge.       Malodorous urine       Objective:    Physical Exam Constitutional:      Appearance: Normal appearance.  Genitourinary:    General: Normal vulva.     Vagina: Vaginal discharge present. No erythema.     BP 116/76   LMP 04/17/2020  Wt Readings from Last 3 Encounters:  01/19/20 230 lb 11.2 oz (104.6 kg)  01/01/20 237 lb (107.5 kg)  12/18/19 260 lb (117.9 kg)   UA negative Wet prep negative     Assessment & Plan:   Problem List Items Addressed This Visit   None   Visit Diagnoses    Vaginal odor    -  Primary   Relevant Orders   WET PREP FOR TRICH, YEAST, CLUE   Abnormal urine odor       Relevant Orders   Urinalysis,Complete w/RFL Culture   Encounter for initial prescription of injectable contraceptive       Relevant Medications   medroxyPROGESTERone (DEPO-PROVERA) injection 150 mg (Start on 05/01/2020  3:00 PM)   Encounter for general counseling and advice on contraceptive management          Plan: UA, wet prep, and exam negative. Contraceptive options were reviewed, including hormonal methods, both combination (pill, patch, vaginal ring) and progesterone-only (pill, Depo Provera and Nexplanon), intrauterine devices (Mirena, [place], [place], and [place]), barrier methods (condoms, diaphragm) and female/female sterilization. The mechanisms, risks, benefits and side effects of all methods were  discussed. She would like to restart Depo Prover and first dose administered today. She was provided with time frame of when next dose is due. She is agreeable to plan.     Olivia Mackie DNP, 2:56 PM 05/01/2020

## 2020-05-03 LAB — URINALYSIS, COMPLETE W/RFL CULTURE
Bilirubin Urine: NEGATIVE
Glucose, UA: NEGATIVE
Hgb urine dipstick: NEGATIVE
Hyaline Cast: NONE SEEN /LPF
Ketones, ur: NEGATIVE
Leukocyte Esterase: NEGATIVE
Nitrites, Initial: NEGATIVE
Protein, ur: NEGATIVE
RBC / HPF: NONE SEEN /HPF (ref 0–2)
Specific Gravity, Urine: 1.025 (ref 1.001–1.03)
pH: 6 (ref 5.0–8.0)

## 2020-05-03 LAB — URINE CULTURE
MICRO NUMBER:: 11792406
SPECIMEN QUALITY:: ADEQUATE

## 2020-05-03 LAB — CULTURE INDICATED

## 2020-07-19 ENCOUNTER — Ambulatory Visit (INDEPENDENT_AMBULATORY_CARE_PROVIDER_SITE_OTHER): Payer: Medicaid Other | Admitting: *Deleted

## 2020-07-19 ENCOUNTER — Other Ambulatory Visit: Payer: Self-pay

## 2020-07-19 VITALS — BP 140/84 | HR 74 | Resp 16 | Ht 68.0 in | Wt 219.5 lb

## 2020-07-19 DIAGNOSIS — Z3042 Encounter for surveillance of injectable contraceptive: Secondary | ICD-10-CM

## 2020-07-19 MED ORDER — MEDROXYPROGESTERONE ACETATE 150 MG/ML IM SUSP
150.0000 mg | Freq: Once | INTRAMUSCULAR | Status: AC
  Administered 2020-07-19: 150 mg via INTRAMUSCULAR

## 2020-07-19 NOTE — Progress Notes (Signed)
Patient is here for Depo Provera Injection Patient is within Depo Provera Calender Limits yes, last given 05-01-20  Next Depo Due between: 9/23 to 10/7  Last AEX: 06-14-2018 [place]  AEX Scheduled: not scheduled   Patient is aware when next depo is due  Pt tolerated Injection well in LUOQ.   Routed to provider for review, encounter closed.

## 2020-09-03 ENCOUNTER — Encounter: Payer: Self-pay | Admitting: Nurse Practitioner

## 2020-09-03 ENCOUNTER — Ambulatory Visit (INDEPENDENT_AMBULATORY_CARE_PROVIDER_SITE_OTHER): Payer: Medicaid Other | Admitting: Nurse Practitioner

## 2020-09-03 ENCOUNTER — Other Ambulatory Visit: Payer: Self-pay

## 2020-09-03 VITALS — BP 118/76

## 2020-09-03 DIAGNOSIS — Z113 Encounter for screening for infections with a predominantly sexual mode of transmission: Secondary | ICD-10-CM | POA: Diagnosis not present

## 2020-09-03 DIAGNOSIS — N898 Other specified noninflammatory disorders of vagina: Secondary | ICD-10-CM | POA: Diagnosis not present

## 2020-09-03 LAB — WET PREP FOR TRICH, YEAST, CLUE

## 2020-09-03 NOTE — Progress Notes (Signed)
   Acute Office Visit  Subjective:    Patient ID: Brooke Howell, female    DOB: [birth year], 30 y.o.   MRN: [id number]   HPI 30 y.o. presents today for vaginal discharge and odor that started a few days ago. Denies itching or irritation.    Review of Systems  Constitutional: Negative.   Genitourinary:  Positive for vaginal discharge.       Vaginal odor      Objective:    Physical Exam Constitutional:      Appearance: Normal appearance.  Genitourinary:    General: Normal vulva.     Vagina: Vaginal discharge present.     Cervix: Normal.    BP 118/76  Wt Readings from Last 3 Encounters:  07/19/20 219 lb 8 oz (99.6 kg)  01/19/20 230 lb 11.2 oz (104.6 kg)  01/01/20 237 lb (107.5 kg)   Wet prep negative     Assessment & Plan:   Problem List Items Addressed This Visit   None Visit Diagnoses     Vaginal discharge    -  Primary   Relevant Orders   WET PREP FOR TRICH, YEAST, CLUE   NuSwab Vaginitis Plus (VG+)   Screen for STD (sexually transmitted disease)       Relevant Orders   NuSwab Vaginitis Plus (VG+)      Plan: Wet prep negative. Gonorrhea/chlamydia/Trich + vaginitis panel pending.      Olivia Mackie DNP, 10:49 AM 09/03/2020

## 2020-09-07 LAB — NUSWAB VAGINITIS PLUS (VG+)
Candida albicans, NAA: NEGATIVE
Candida glabrata, NAA: NEGATIVE
Chlamydia trachomatis, NAA: NEGATIVE
Neisseria gonorrhoeae, NAA: NEGATIVE
Trich vag by NAA: NEGATIVE

## 2020-10-08 ENCOUNTER — Encounter: Payer: Self-pay | Admitting: Nurse Practitioner

## 2020-10-08 ENCOUNTER — Other Ambulatory Visit: Payer: Self-pay

## 2020-10-08 ENCOUNTER — Ambulatory Visit (INDEPENDENT_AMBULATORY_CARE_PROVIDER_SITE_OTHER): Payer: Medicaid Other | Admitting: Nurse Practitioner

## 2020-10-08 VITALS — BP 118/76 | Ht 68.0 in | Wt 212.0 lb

## 2020-10-08 DIAGNOSIS — Z01419 Encounter for gynecological examination (general) (routine) without abnormal findings: Secondary | ICD-10-CM

## 2020-10-08 DIAGNOSIS — Z3042 Encounter for surveillance of injectable contraceptive: Secondary | ICD-10-CM

## 2020-10-08 MED ORDER — MEDROXYPROGESTERONE ACETATE 150 MG/ML IM SUSP
150.0000 mg | Freq: Once | INTRAMUSCULAR | Status: AC
  Administered 2020-10-08: 150 mg via INTRAMUSCULAR

## 2020-10-08 NOTE — Progress Notes (Signed)
   Brooke Howell 1990-06-26 [id number]   History:  30 y.o. [id number] presents for annual exam. No GYN complaints. Amenorrheic on Depo Provera. 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 History of syphilis, chlamydia.  Negative breast biopsy 2017.  History of recurrent BV.   Gynecologic History No LMP recorded. Patient has had an injection.   Contraception/Family planning: Depo-Provera injections Sexually active: Yes  Health Maintenance Last Pap: 06/14/2018. Results were: Normal, 5-year repeat Last mammogram: Not indicated Last colonoscopy: Not indicated Last Dexa: Not indicated  Past medical history, past surgical history, family history and social history were all reviewed and documented in the EPIC chart. 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   ROS:  A ROS was performed and pertinent positives and negatives are included.  Exam:  Vitals:   10/08/20 1511  BP: 118/76  Weight: 212 lb (96.2 kg)  Height: 5\' 8"  (1.727 m)   Body mass index is 32.23 kg/m.  General appearance:  Normal Thyroid:  Symmetrical, normal in size, without palpable masses or nodularity. Respiratory  Auscultation:  Clear without wheezing or rhonchi Cardiovascular  Auscultation:  Regular rate, without rubs, murmurs or gallops  Edema/varicosities:  Not grossly evident Abdominal  Soft,nontender, without masses, guarding or rebound.  Liver/spleen:  No organomegaly noted  Hernia:  None appreciated  Skin  Inspection:  Grossly normal Breasts: Examined lying and sitting.   Right: Without masses, retractions, nipple discharge or axillary adenopathy.   Left: Without masses, retractions, nipple discharge or axillary adenopathy. Genitourinary   Inguinal/mons:  Normal without inguinal adenopathy  External genitalia:  Normal appearing vulva with no masses, tenderness, or lesions  BUS/Urethra/Skene's glands:  Normal  Vagina:  Normal appearing with normal color and discharge, no  lesions  Cervix:  Normal appearing without discharge or lesions  Uterus:  Normal in size, shape and contour.  Midline and mobile, nontender  Adnexa/parametria:     Rt: Normal in size, without masses or tenderness.   Lt: Normal in size, without masses or tenderness.  Anus and perineum: Normal  Patient informed chaperone available to be present for breast and pelvic exam. Patient has requested no chaperone to be present. Patient has been advised what will be completed during breast and pelvic exam.   Assessment/Plan:  30 y.o. 30 for annual exam.   Well female exam with routine gynecological exam - Education provided on SBEs, importance of preventative screenings, current guidelines, high calcium diet, regular exercise, and multivitamin daily.   Encounter for surveillance of injectable contraceptive - Plan: medroxyPROGESTERone (DEPO-PROVERA) injection 150 mg every 3 months. Amenorrheic. Dose provided today.   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 Will repeat at 5-year interval per guidelines.  Return in 1 year for annual.   2014 DNP, 3:50 PM 10/08/2020

## 2020-11-25 ENCOUNTER — Encounter: Payer: Self-pay | Admitting: Nurse Practitioner

## 2020-11-25 ENCOUNTER — Ambulatory Visit (INDEPENDENT_AMBULATORY_CARE_PROVIDER_SITE_OTHER): Payer: Medicaid Other | Admitting: Nurse Practitioner

## 2020-11-25 ENCOUNTER — Other Ambulatory Visit: Payer: Self-pay

## 2020-11-25 VITALS — BP 120/70

## 2020-11-25 DIAGNOSIS — N920 Excessive and frequent menstruation with regular cycle: Secondary | ICD-10-CM | POA: Diagnosis not present

## 2020-11-25 DIAGNOSIS — R1030 Lower abdominal pain, unspecified: Secondary | ICD-10-CM | POA: Diagnosis not present

## 2020-11-25 NOTE — Progress Notes (Signed)
   Acute Office Visit  Subjective:    Patient ID: Brooke Howell, female    DOB: March 13, 1990, 30 y.o.   MRN: [id number]   HPI 30 y.o. presents today for dark brown spotting and lower abdominal pain. Brown spotting started about 2 weeks ago but she has had the pain since mid July. The pain is intermittent, in different locations but always in the pelvic region, knife-life/knawing,10/10 at its worst. She lays down when pain occurs. Restarted Depo Provera 07/2020 after having daughter December 2021. She as on Depo for years prior to pregnancy. Last Depo Provera 10/08/2020. No changes in bowels. Has not been sexually active since mid summer, reports negative STD since. She did have the flu 2 weeks ago.    Review of Systems  Constitutional: Negative.   Gastrointestinal:  Positive for abdominal pain (lower).  Genitourinary:  Positive for menstrual problem.      Objective:    Physical Exam Constitutional:      Appearance: Normal appearance.  Abdominal:     Palpations: Abdomen is soft.     Tenderness: There is no abdominal tenderness. There is no guarding or rebound.  Genitourinary:    General: Normal vulva.     Vagina: Vaginal discharge and bleeding present.     Cervix: Normal.     Uterus: Normal.     BP 120/70  Wt Readings from Last 3 Encounters:  10/08/20 212 lb (96.2 kg)  07/19/20 219 lb 8 oz (99.6 kg)  01/19/20 230 lb 11.2 oz (104.6 kg)        Assessment & Plan:   Problem List Items Addressed This Visit   None Visit Diagnoses     Lower abdominal pain    -  Primary   Relevant Orders   US PELVIS TRANSVAGINAL NON-OB (TV ONLY)   Spotting       Relevant Orders   US PELVIS TRANSVAGINAL NON-OB (TV ONLY)      Plan: Declines STD screening. Will schedule pelvic ultrasound for further evaluation. Ibuprofen/Tylenol as needed for pain.      Olivia Mackie DNP, 8:47 AM 11/25/2020

## 2020-12-17 ENCOUNTER — Other Ambulatory Visit: Payer: Self-pay

## 2020-12-17 ENCOUNTER — Ambulatory Visit: Payer: Medicaid Other | Admitting: Obstetrics and Gynecology

## 2020-12-17 ENCOUNTER — Encounter: Payer: Self-pay | Admitting: Obstetrics and Gynecology

## 2020-12-17 ENCOUNTER — Ambulatory Visit (INDEPENDENT_AMBULATORY_CARE_PROVIDER_SITE_OTHER): Payer: Medicaid Other

## 2020-12-17 VITALS — BP 100/64 | HR 78 | Ht 67.0 in | Wt 200.0 lb

## 2020-12-17 DIAGNOSIS — R1030 Lower abdominal pain, unspecified: Secondary | ICD-10-CM | POA: Diagnosis not present

## 2020-12-17 DIAGNOSIS — N920 Excessive and frequent menstruation with regular cycle: Secondary | ICD-10-CM

## 2020-12-17 DIAGNOSIS — R109 Unspecified abdominal pain: Secondary | ICD-10-CM

## 2020-12-17 DIAGNOSIS — R102 Pelvic and perineal pain: Secondary | ICD-10-CM
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
## 2020-12-19 ENCOUNTER — Encounter: Payer: Self-pay | Admitting: Obstetrics and Gynecology

## 2021-01-07 ENCOUNTER — Other Ambulatory Visit: Payer: Self-pay

## 2021-01-07 ENCOUNTER — Ambulatory Visit (INDEPENDENT_AMBULATORY_CARE_PROVIDER_SITE_OTHER): Payer: Medicaid Other

## 2021-01-07 DIAGNOSIS — Z3042 Encounter for surveillance of injectable contraceptive: Secondary | ICD-10-CM

## 2021-01-07 MED ORDER — MEDROXYPROGESTERONE ACETATE 150 MG/ML IM SUSP
150.0000 mg | Freq: Once | INTRAMUSCULAR | Status: AC
  Administered 2021-01-07: 14:00:00 150 mg via INTRAMUSCULAR

## 2021-03-04 DIAGNOSIS — K8020 Calculus of gallbladder without cholecystitis without obstruction: Secondary | ICD-10-CM

## 2021-03-04 HISTORY — DX: Calculus of gallbladder without cholecystitis without obstruction: K80.20

## 2021-03-08 ENCOUNTER — Inpatient Hospital Stay (HOSPITAL_COMMUNITY)
Admission: EM | Admit: 2021-03-08 | Discharge: 2021-03-11 | DRG: 418 | Disposition: A | Discharge status: Home / Self Care | Payer: Medicaid Other | Attending: Internal Medicine | Admitting: Internal Medicine

## 2021-03-08 DIAGNOSIS — B179 Acute viral hepatitis, unspecified: Principal | ICD-10-CM | POA: Diagnosis present

## 2021-03-08 DIAGNOSIS — R748 Abnormal levels of other serum enzymes: Secondary | ICD-10-CM

## 2021-03-08 DIAGNOSIS — Z803 Family history of malignant neoplasm of breast: Secondary | ICD-10-CM

## 2021-03-08 DIAGNOSIS — K7200 Acute and subacute hepatic failure without coma: Principal | ICD-10-CM | POA: Diagnosis present

## 2021-03-08 DIAGNOSIS — E669 Obesity, unspecified: Secondary | ICD-10-CM | POA: Diagnosis present

## 2021-03-08 DIAGNOSIS — Z20822 Contact with and (suspected) exposure to covid-19: Secondary | ICD-10-CM | POA: Diagnosis present

## 2021-03-08 DIAGNOSIS — Z6832 Body mass index (BMI) 32.0-32.9, adult: Secondary | ICD-10-CM

## 2021-03-08 DIAGNOSIS — Z8632 Personal history of gestational diabetes: Secondary | ICD-10-CM

## 2021-03-08 DIAGNOSIS — Z8249 Family history of ischemic heart disease and other diseases of the circulatory system: Secondary | ICD-10-CM

## 2021-03-08 DIAGNOSIS — Z833 Family history of diabetes mellitus: Secondary | ICD-10-CM

## 2021-03-08 DIAGNOSIS — F17210 Nicotine dependence, cigarettes, uncomplicated: Secondary | ICD-10-CM | POA: Diagnosis present

## 2021-03-08 DIAGNOSIS — Z820 Family history of epilepsy and other diseases of the nervous system: Secondary | ICD-10-CM

## 2021-03-08 DIAGNOSIS — Z91013 Allergy to seafood: Secondary | ICD-10-CM

## 2021-03-08 DIAGNOSIS — Z91040 Latex allergy status: Secondary | ICD-10-CM

## 2021-03-08 DIAGNOSIS — K8020 Calculus of gallbladder without cholecystitis without obstruction: Secondary | ICD-10-CM

## 2021-03-08 DIAGNOSIS — K8021 Calculus of gallbladder without cholecystitis with obstruction: Secondary | ICD-10-CM

## 2021-03-08 DIAGNOSIS — K760 Fatty (change of) liver, not elsewhere classified: Secondary | ICD-10-CM | POA: Diagnosis present

## 2021-03-08 DIAGNOSIS — K8062 Calculus of gallbladder and bile duct with acute cholecystitis without obstruction: Secondary | ICD-10-CM | POA: Diagnosis present

## 2021-03-08 LAB — COMPREHENSIVE METABOLIC PANEL
ALT: 902 U/L — ABNORMAL HIGH (ref 0–44)
AST: 963 U/L — ABNORMAL HIGH (ref 15–41)
Albumin: 3.8 g/dL (ref 3.5–5.0)
Alkaline Phosphatase: 110 U/L (ref 38–126)
Anion gap: 8 (ref 5–15)
BUN: 11 mg/dL (ref 6–20)
CO2: 27 mmol/L (ref 22–32)
Calcium: 9.5 mg/dL (ref 8.9–10.3)
Chloride: 105 mmol/L (ref 98–111)
Creatinine, Ser: 1.14 mg/dL — ABNORMAL HIGH (ref 0.44–1.00)
GFR, Estimated: >60 mL/min (ref >60)
Glucose, Bld: 109 mg/dL — ABNORMAL HIGH (ref 70–99)
Potassium: 4.3 mmol/L (ref 3.5–5.1)
Sodium: 140 mmol/L (ref 135–145)
Total Bilirubin: 3.2 mg/dL — ABNORMAL HIGH (ref 0.3–1.2)
Total Protein: 7.4 g/dL (ref 6.5–8.1)

## 2021-03-08 LAB — URINALYSIS, ROUTINE W REFLEX MICROSCOPIC
Glucose, UA: NEGATIVE mg/dL
Hgb urine dipstick: NEGATIVE
Ketones, ur: NEGATIVE mg/dL
Leukocytes,Ua: NEGATIVE
Nitrite: NEGATIVE
Protein, ur: 100 mg/dL — AB
Specific Gravity, Urine: 1.04 — ABNORMAL HIGH (ref 1.005–1.030)
pH: 6 (ref 5.0–8.0)

## 2021-03-08 LAB — CBC
HCT: 40.5 % (ref 36.0–46.0)
Hemoglobin: 13 g/dL (ref 12.0–15.0)
MCH: 29.3 pg (ref 26.0–34.0)
MCHC: 32.1 g/dL (ref 30.0–36.0)
MCV: 91.4 fL (ref 80.0–100.0)
Platelets: 278 10*3/uL (ref 150–400)
RBC: 4.43 MIL/uL (ref 3.87–5.11)
RDW: 12.6 % (ref 11.5–15.5)
WBC: 4 10*3/uL (ref 4.0–10.5)
nRBC: 0 % (ref 0.0–0.2)

## 2021-03-08 LAB — I-STAT BETA HCG BLOOD, ED (MC, WL, AP ONLY): I-stat hCG, quantitative: <5 m[IU]/mL (ref <5)

## 2021-03-08 LAB — LIPASE, BLOOD: Lipase: 27 U/L (ref 11–51)

## 2021-03-08 MED ORDER — ONDANSETRON 4 MG PO TBDP
4.0000 mg | ORAL_TABLET | Freq: Once | ORAL | Status: AC | PRN
  Administered 2021-03-08: 4 mg via ORAL
  Filled 2021-03-08: qty 1

## 2021-03-08 NOTE — ED Triage Notes (Signed)
Pt c/o intermittent abd/ back pain x59mos, "like balloon is blowing up in me," associates N/V, poor PO intake. Pt endorses cloudy urine, "like sediment is in it." Reassuring physical within last 3 mos, US performed previously, no findings. No meds PTA

## 2021-03-09 ENCOUNTER — Other Ambulatory Visit: Payer: Self-pay

## 2021-03-09 ENCOUNTER — Encounter (HOSPITAL_COMMUNITY): Payer: Self-pay | Admitting: Family Medicine

## 2021-03-09 ENCOUNTER — Inpatient Hospital Stay (HOSPITAL_COMMUNITY): Payer: Medicaid Other

## 2021-03-09 ENCOUNTER — Emergency Department (HOSPITAL_COMMUNITY): Payer: Medicaid Other

## 2021-03-09 DIAGNOSIS — K8020 Calculus of gallbladder without cholecystitis without obstruction: Secondary | ICD-10-CM

## 2021-03-09 DIAGNOSIS — Z8249 Family history of ischemic heart disease and other diseases of the circulatory system: Secondary | ICD-10-CM | POA: Diagnosis not present

## 2021-03-09 DIAGNOSIS — Z20822 Contact with and (suspected) exposure to covid-19: Secondary | ICD-10-CM | POA: Diagnosis present

## 2021-03-09 DIAGNOSIS — Z820 Family history of epilepsy and other diseases of the nervous system: Secondary | ICD-10-CM | POA: Diagnosis not present

## 2021-03-09 DIAGNOSIS — Z91013 Allergy to seafood: Secondary | ICD-10-CM | POA: Diagnosis not present

## 2021-03-09 DIAGNOSIS — Z8632 Personal history of gestational diabetes: Secondary | ICD-10-CM | POA: Diagnosis not present

## 2021-03-09 DIAGNOSIS — Z833 Family history of diabetes mellitus: Secondary | ICD-10-CM | POA: Diagnosis not present

## 2021-03-09 DIAGNOSIS — B179 Acute viral hepatitis, unspecified: Principal | ICD-10-CM | POA: Diagnosis present

## 2021-03-09 DIAGNOSIS — F17210 Nicotine dependence, cigarettes, uncomplicated: Secondary | ICD-10-CM | POA: Diagnosis present

## 2021-03-09 DIAGNOSIS — K8062 Calculus of gallbladder and bile duct with acute cholecystitis without obstruction: Secondary | ICD-10-CM | POA: Diagnosis present

## 2021-03-09 DIAGNOSIS — K810 Acute cholecystitis: Secondary | ICD-10-CM | POA: Diagnosis not present

## 2021-03-09 DIAGNOSIS — E669 Obesity, unspecified: Secondary | ICD-10-CM | POA: Diagnosis present

## 2021-03-09 DIAGNOSIS — Z803 Family history of malignant neoplasm of breast: Secondary | ICD-10-CM | POA: Diagnosis not present

## 2021-03-09 DIAGNOSIS — R1011 Right upper quadrant pain: Secondary | ICD-10-CM | POA: Diagnosis present

## 2021-03-09 DIAGNOSIS — Z6832 Body mass index (BMI) 32.0-32.9, adult: Secondary | ICD-10-CM | POA: Diagnosis not present

## 2021-03-09 DIAGNOSIS — K760 Fatty (change of) liver, not elsewhere classified: Secondary | ICD-10-CM | POA: Diagnosis present

## 2021-03-09 DIAGNOSIS — K7200 Acute and subacute hepatic failure without coma: Secondary | ICD-10-CM | POA: Diagnosis present

## 2021-03-09 DIAGNOSIS — Z91040 Latex allergy status: Secondary | ICD-10-CM | POA: Diagnosis not present

## 2021-03-09 LAB — COMPREHENSIVE METABOLIC PANEL
ALT: 730 U/L — ABNORMAL HIGH (ref 0–44)
AST: 540 U/L — ABNORMAL HIGH (ref 15–41)
Albumin: 3.3 g/dL — ABNORMAL LOW (ref 3.5–5.0)
Alkaline Phosphatase: 111 U/L (ref 38–126)
Anion gap: 9 (ref 5–15)
BUN: 10 mg/dL (ref 6–20)
CO2: 23 mmol/L (ref 22–32)
Calcium: 8.9 mg/dL (ref 8.9–10.3)
Chloride: 109 mmol/L (ref 98–111)
Creatinine, Ser: 1.04 mg/dL — ABNORMAL HIGH (ref 0.44–1.00)
GFR, Estimated: >60 mL/min (ref >60)
Glucose, Bld: 94 mg/dL (ref 70–99)
Potassium: 4 mmol/L (ref 3.5–5.1)
Sodium: 141 mmol/L (ref 135–145)
Total Bilirubin: 2.9 mg/dL — ABNORMAL HIGH (ref 0.3–1.2)
Total Protein: 6.4 g/dL — ABNORMAL LOW (ref 6.5–8.1)

## 2021-03-09 LAB — CBC
HCT: 35.8 % — ABNORMAL LOW (ref 36.0–46.0)
Hemoglobin: 11.7 g/dL — ABNORMAL LOW (ref 12.0–15.0)
MCH: 29.7 pg (ref 26.0–34.0)
MCHC: 32.7 g/dL (ref 30.0–36.0)
MCV: 90.9 fL (ref 80.0–100.0)
Platelets: 207 10*3/uL (ref 150–400)
RBC: 3.94 MIL/uL (ref 3.87–5.11)
RDW: 12.5 % (ref 11.5–15.5)
WBC: 3.6 10*3/uL — ABNORMAL LOW (ref 4.0–10.5)
nRBC: 0 % (ref 0.0–0.2)

## 2021-03-09 LAB — HEPATITIS PANEL, ACUTE
HCV Ab: NONREACTIVE
Hep A IgM: NONREACTIVE
Hep B C IgM: NONREACTIVE
Hepatitis B Surface Ag: NONREACTIVE

## 2021-03-09 LAB — PROTIME-INR
INR: 1 (ref 0.8–1.2)
Prothrombin Time: 13.1 seconds (ref 11.4–15.2)

## 2021-03-09 LAB — HIV ANTIBODY (ROUTINE TESTING W REFLEX): HIV Screen 4th Generation wRfx: NONREACTIVE

## 2021-03-09 LAB — RESP PANEL BY RT-PCR (FLU A&B, COVID) ARPGX2
Influenza A by PCR: NEGATIVE
Influenza B by PCR: NEGATIVE
SARS Coronavirus 2 by RT PCR: NEGATIVE

## 2021-03-09 LAB — APTT: aPTT: 28 seconds (ref 24–36)

## 2021-03-09 MED ORDER — ONDANSETRON HCL 4 MG/2ML IJ SOLN: 4.0000 mg | Freq: Four times a day (QID) | INTRAMUSCULAR | Status: DC | PRN

## 2021-03-09 MED ORDER — ONDANSETRON HCL 4 MG PO TABS: 4.0000 mg | ORAL_TABLET | Freq: Four times a day (QID) | ORAL | Status: DC | PRN

## 2021-03-09 MED ORDER — MORPHINE SULFATE (PF) 2 MG/ML IV SOLN: 2.0000 mg | INTRAVENOUS | Status: DC | PRN

## 2021-03-09 MED ORDER — GADOBUTROL 1 MMOL/ML IV SOLN
9.7000 mL | Freq: Once | INTRAVENOUS | Status: AC | PRN
  Administered 2021-03-09: 9.7 mL via INTRAVENOUS

## 2021-03-09 MED ORDER — ENOXAPARIN SODIUM 40 MG/0.4ML IJ SOSY: 40.0000 mg | PREFILLED_SYRINGE | INTRAMUSCULAR | Status: DC

## 2021-03-09 MED ORDER — DEXTROSE-NACL 5-0.9 % IV SOLN: INTRAVENOUS | Status: DC

## 2021-03-09 MED ORDER — TRAZODONE HCL 50 MG PO TABS
25.0000 mg | ORAL_TABLET | Freq: Every evening | ORAL | Status: DC | PRN
Start: 2021-03-09 — End: 2021-03-11

## 2021-03-09 MED ORDER — MAGNESIUM HYDROXIDE 400 MG/5ML PO SUSP
30.0000 mL | Freq: Every day | ORAL | Status: DC | PRN
  Filled 2021-03-09: qty 30

## 2021-03-09 MED ORDER — LACTATED RINGERS IV BOLUS
1000.0000 mL | Freq: Once | INTRAVENOUS | Status: AC
  Administered 2021-03-09: 1000 mL via INTRAVENOUS

## 2021-03-09 MED ORDER — SODIUM CHLORIDE 0.9 % IV SOLN
1.5000 g | Freq: Four times a day (QID) | INTRAVENOUS | Status: DC
  Administered 2021-03-09 – 2021-03-10 (×3): 1.5 g via INTRAVENOUS
  Filled 2021-03-09 (×6): qty 4

## 2021-03-09 NOTE — Progress Notes (Signed)
PROGRESS NOTE    Brooke Howell  PJA:[id number] DOB: 24-Jul-1990 DOA: 03/08/2021 PCP: Pcp, No   Brief Narrative:  Brooke Howell is a 31 y.o. obese African-American female with no chronic medical problems, presents emergency room with acute onset of epigastric and right upper quadrant abdominal pain felt as pressure which which has been going on since Friday (a day and half).  She has been having intermittent pain that is not as intense as today's about every couple weeks and it would resolve spontaneously.  This time she had associated nausea and significant anorexia.  She had recent pelvic ultrasound and labs by her obstetrician that were unremarkable.  No new recent medications.  She denied any recent blood transfusions.  Her pain started 6 months after having her last baby in December 2021 on an intermittent basis.    Assessment & Plan:   Principal Problem:   Acute hepatitis Active Problems:   Cholelithiasis  Acute versus acute on chronic hepatitis- (present on admission) -Unclear etiology, CT abdomen shows cholelithiasis without obstruction -Right upper quadrant ultrasound shows thickened gallbladder without adjacent fluid or Murphy's -Patient indicates somewhat chronic abdominal discomfort -MRCP pending -Hepatitis panel nonreactive -AST ALT bilirubin downtrending appropriately; alk phos stable within normal limits -Acetaminophen level ordered for drawl off initial labs in the ED and again now to follow for potential overdose   Cholelithiasis versus cholecystitis, POA -Imaging remarkable for gallbladder wall thickening concerning for acute versus chronic cholecystitis of unknown etiology -General surgery following, appreciate insight recommendations -MRCP pending  DVT prophylaxis: lovenox Code Status: Full Family Communication: None present  Status is: Inpatient  Dispo: The patient is from: Home              Anticipated d/c is to: Home              Anticipated d/c date  is: 24 to 48 hours              Patient currently not medically stable for discharge  Consultants:  General surgery  Procedures:  None  Antimicrobials:  Unasyn  Subjective: No acute issues or events overnight, feeling markedly improved but not yet back to baseline denies nausea vomiting diarrhea constipation headache fevers chills or chest pain  Objective: Vitals:   03/09/21 0141 03/09/21 0200 03/09/21 0348 03/09/21 0514  BP: 128/74 136/83 128/85 121/79  Pulse: 69 80 71 82  Resp: $Remo'17 16 10 20  'KpRVH$ Temp:    98 F (36.7 C)  TempSrc:    Oral  SpO2: 98% 97% 99% 98%  Weight:    97.6 kg  Height:    '5\' 8"'$  (1.727 m)    Intake/Output Summary (Last 24 hours) at 03/09/2021 0757 Last data filed at 03/09/2021 0539 Gross per 24 hour  Intake 1401.1 ml  Output --  Net 1401.1 ml   Filed Weights   03/09/21 0514  Weight: 97.6 kg    Examination:  General exam: Appears calm and comfortable  Respiratory system: Clear to auscultation. Respiratory effort normal. Cardiovascular system: S1 & S2 heard, RRR. No JVD, murmurs, rubs, gallops or clicks. No pedal edema. Gastrointestinal system: Abdomen is nondistended, soft and nontender. No organomegaly or masses felt. Normal bowel sounds heard. Central nervous system: Alert and oriented. No focal neurological deficits. Extremities: Symmetric 5 x 5 power. Skin: No rashes, lesions or ulcers Psychiatry: Judgement and insight appear normal. Mood & affect appropriate.   Data Reviewed: I have personally reviewed following labs and imaging studies  CBC: Recent Labs  Lab  03/08/21 2044 03/09/21 0457  WBC 4.0 3.6*  HGB 13.0 11.7*  HCT 40.5 35.8*  MCV 91.4 90.9  PLT 278 992   Basic Metabolic Panel: Recent Labs  Lab 03/08/21 2044 03/09/21 0457  NA 140 141  K 4.3 4.0  CL 105 109  CO2 27 23  GLUCOSE 109* 94  BUN 11 10  CREATININE 1.14* 1.04*  CALCIUM 9.5 8.9   GFR: Estimated Creatinine Clearance: 94.9 mL/min (A) (by C-G formula based on  SCr of 1.04 mg/dL (H)). Liver Function Tests: Recent Labs  Lab 03/08/21 2044 03/09/21 0457  AST 963* 540*  ALT 902* 730*  ALKPHOS 110 111  BILITOT 3.2* 2.9*  PROT 7.4 6.4*  ALBUMIN 3.8 3.3*   Recent Labs  Lab 03/08/21 2044  LIPASE 27   No results for input(s): AMMONIA in the last 168 hours. Coagulation Profile: Recent Labs  Lab 03/09/21 0645  INR 1.0   Cardiac Enzymes: No results for input(s): CKTOTAL, CKMB, CKMBINDEX, TROPONINI in the last 168 hours. BNP (last 3 results) No results for input(s): PROBNP in the last 8760 hours. HbA1C: No results for input(s): HGBA1C in the last 72 hours. CBG: No results for input(s): GLUCAP in the last 168 hours. Lipid Profile: No results for input(s): CHOL, HDL, LDLCALC, TRIG, CHOLHDL, LDLDIRECT in the last 72 hours. Thyroid Function Tests: No results for input(s): TSH, T4TOTAL, FREET4, T3FREE, THYROIDAB in the last 72 hours. Anemia Panel: No results for input(s): VITAMINB12, FOLATE, FERRITIN, TIBC, IRON, RETICCTPCT in the last 72 hours. Sepsis Labs: No results for input(s): PROCALCITON, LATICACIDVEN in the last 168 hours.  Recent Results (from the past 240 hour(s))  Resp Panel by RT-PCR (Flu A&B, Covid) Nasopharyngeal Swab     Status: None   Collection Time: 03/09/21  2:36 AM   Specimen: Nasopharyngeal Swab; Nasopharyngeal(NP) swabs in vial transport medium  Result Value Ref Range Status   SARS Coronavirus 2 by RT PCR NEGATIVE NEGATIVE Final    Comment: (NOTE) SARS-CoV-2 target nucleic acids are NOT DETECTED.  The SARS-CoV-2 RNA is generally detectable in upper respiratory specimens during the acute phase of infection. The lowest concentration of SARS-CoV-2 viral copies this assay can detect is 138 copies/mL. A negative result does not preclude SARS-Cov-2 infection and should not be used as the sole basis for treatment or other patient management decisions. A negative result may occur with  improper specimen  collection/handling, submission of specimen other than nasopharyngeal swab, presence of viral mutation(s) within the areas targeted by this assay, and inadequate number of viral copies(<138 copies/mL). A negative result must be combined with clinical observations, patient history, and epidemiological information. The expected result is Negative.  Fact Sheet for Patients:  EntrepreneurPulse.com.au  Fact Sheet for Healthcare Providers:  IncredibleEmployment.be  This test is no t yet approved or cleared by the [place] FDA and  has been authorized for detection and/or diagnosis of SARS-CoV-2 by FDA under an Emergency Use Authorization (EUA). This EUA will remain  in effect (meaning this test can be used) for the duration of the COVID-19 declaration under Section 564(b)(1) of the Act, 21 U.S.C.section 360bbb-3(b)(1), unless the authorization is terminated  or revoked sooner.       Influenza A by PCR NEGATIVE NEGATIVE Final   Influenza B by PCR NEGATIVE NEGATIVE Final    Comment: (NOTE) The Xpert Xpress SARS-CoV-2/FLU/RSV plus assay is intended as an aid in the diagnosis of influenza from Nasopharyngeal swab specimens and should not be used as a sole basis  for treatment. Nasal washings and aspirates are unacceptable for Xpert Xpress SARS-CoV-2/FLU/RSV testing.  Fact Sheet for Patients: EntrepreneurPulse.com.au  Fact Sheet for Healthcare Providers: IncredibleEmployment.be  This test is not yet approved or cleared by the [place] FDA and has been authorized for detection and/or diagnosis of SARS-CoV-2 by FDA under an Emergency Use Authorization (EUA). This EUA will remain in effect (meaning this test can be used) for the duration of the COVID-19 declaration under Section 564(b)(1) of the Act, 21 U.S.C. section 360bbb-3(b)(1), unless the authorization is terminated or revoked.  Performed at [facility], [place] [address], [place], [place] [address]     Radiology Studies: US Abdomen Limited RUQ (LIVER/GB)  Result Date: 03/09/2021 CLINICAL DATA:  Elevated liver enzymes. EXAM: ULTRASOUND ABDOMEN LIMITED RIGHT UPPER QUADRANT COMPARISON:  None. FINDINGS: Gallbladder: There are small layering stones in the gallbladder. No pericholecystic fluid is seen with the free wall show mild thickening up to 3.8 mm. There is no positive sonographic Murphy's sign. Largest calculus is 7 mm. Common bile duct: Diameter: 3.9 mm.  No intrahepatic biliary prominence. Liver: No focal lesion identified. There is mild increased hepatic echogenicity consistent with steatosis. Portal vein is patent on color Doppler imaging with normal direction of blood flow towards the liver. Other: None. IMPRESSION: 1. Mildly thickened gallbladder with stones but no adjacent fluid or positive sonographic Murphy's sign. 2. Differential diagnosis is chronic cholecystitis, pain-medicated acute cholecystitis, and wall thickening related to hepatic dysfunction and/or passive congestion, or reactive from adjacent inflammatory process. 3. Mild increased liver echogenicity of steatosis. Electronically Signed   By: Telford Nab M.D.   On: 03/09/2021 01:07    Scheduled Meds:  enoxaparin (LOVENOX) injection  40 mg Subcutaneous Q24H   Continuous Infusions:  dextrose 5 % and 0.9% NaCl 100 mL/hr at 03/09/21 0539     LOS: 0 days   Time spent: 60min  Brooke Buerkle C Darrien Belter, DO [facility]  If 7PM-7AM, please contact night-coverage [email or web address]  03/09/2021, 7:57 AM
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
## 2021-03-09 NOTE — ED Provider Notes (Signed)
[facility] EMERGENCY DEPARTMENT Provider Note   CSN: [id number] Arrival date & time: 03/08/21  2013     History  Chief Complaint  Patient presents with   Abdominal Pain   Back Pain    Brooke Howell is a 31 y.o. female.  31 year old female who presents emerged from today with pain.  Patient states that she had a child in December 2021 and about 5 or 6 months after that she started having some pressure in her epigastric and right upper quadrant area.  This would happen every couple weeks.  It would be pretty severe in nature keeping her down for couple hours may be some nausea with it but nothing too bad from that standpoint.  Patient states that this would happen every couple weeks and no issues in between.  She did not notice that it was related to eating, drinking, movement or any other activities.  She states it could happen anytime of the day.  She had a pelvic ultrasound done and some basic labs by her obstetrician which were unremarkable.  The pain came back again on Friday and did not improve over the day or today so came here for further evaluation.  Little bit nauseous but nothing severe this improved with Zofran.  Does not taking pain medication at home.  No history of hepatitis or IV drug use.  No blood transfusions.  No excessive Tylenol use.    Abdominal Pain Back Pain Associated symptoms: abdominal pain       Home Medications Prior to Admission medications   Medication Sig Start Date End Date Taking? Authorizing Provider  Ascorbic Acid (VITAMIN C PO) Take 1 tablet by mouth daily.   Yes [provider]  medroxyPROGESTERone (DEPO-PROVERA) 150 MG/ML injection Inject 150 mg into the muscle every 3 (three) months.   Yes [provider]      Allergies    Latex and Shellfish allergy    Review of Systems   Review of Systems  Gastrointestinal:  Positive for abdominal pain.  Musculoskeletal:  Positive for back pain.   Physical  Exam Updated Vital Signs BP 121/79 (BP Location: Right Arm)    Pulse 82    Temp 98 F (36.7 C) (Oral)    Resp 20    Ht 5\' 8"  (1.727 m)    Wt 97.6 kg    SpO2 98%    BMI 32.72 kg/m  Physical Exam Vitals and nursing note reviewed.  Constitutional:      Appearance: She is well-developed.  HENT:     Head: Normocephalic and atraumatic.     Mouth/Throat:     Mouth: Mucous membranes are moist.     Pharynx: Oropharynx is clear.  Eyes:     Pupils: Pupils are equal, round, and reactive to light.  Cardiovascular:     Rate and Rhythm: Normal rate and regular rhythm.  Pulmonary:     Effort: No respiratory distress.     Breath sounds: No stridor.  Abdominal:     General: There is no distension.     Tenderness: There is abdominal tenderness in the right upper quadrant and epigastric area. There is no guarding.     Hernia: No hernia is present.  Musculoskeletal:        General: No swelling or tenderness. Normal range of motion.     Cervical back: Normal range of motion.  Skin:    General: Skin is warm and dry.  Neurological:     General: No  focal deficit present.     Mental Status: She is alert.    ED Results / Procedures / Treatments   Labs (all labs ordered are listed, but only abnormal results are displayed) Labs Reviewed  COMPREHENSIVE METABOLIC PANEL - Abnormal; Notable for the following components:      Result Value   Glucose, Bld 109 (*)    Creatinine, Ser 1.14 (*)    AST 963 (*)    ALT 902 (*)    Total Bilirubin 3.2 (*)    All other components within normal limits  URINALYSIS, ROUTINE W REFLEX MICROSCOPIC - Abnormal; Notable for the following components:   Color, Urine AMBER (*)    Specific Gravity, Urine 1.040 (*)    Bilirubin Urine MODERATE (*)    Protein, ur 100 (*)    Bacteria, UA RARE (*)    All other components within normal limits  CBC - Abnormal; Notable for the following components:   WBC 3.6 (*)    Hemoglobin 11.7 (*)    HCT 35.8 (*)    All other components  within normal limits  RESP PANEL BY RT-PCR (FLU A&B, COVID) ARPGX2  LIPASE, BLOOD  CBC  HEPATITIS PANEL, ACUTE  HIV ANTIBODY (ROUTINE TESTING W REFLEX)  COMPREHENSIVE METABOLIC PANEL  PROTIME-INR  APTT  I-STAT BETA HCG BLOOD, ED ([facility], [facility], [facility] ONLY)    EKG None  Radiology US Abdomen Limited RUQ (LIVER/GB)  Result Date: 03/09/2021 CLINICAL DATA:  Elevated liver enzymes. EXAM: ULTRASOUND ABDOMEN LIMITED RIGHT UPPER QUADRANT COMPARISON:  None. FINDINGS: Gallbladder: There are small layering stones in the gallbladder. No pericholecystic fluid is seen with the free wall show mild thickening up to 3.8 mm. There is no positive sonographic Murphy's sign. Largest calculus is 7 mm. Common bile duct: Diameter: 3.9 mm.  No intrahepatic biliary prominence. Liver: No focal lesion identified. There is mild increased hepatic echogenicity consistent with steatosis. Portal vein is patent on color Doppler imaging with normal direction of blood flow towards the liver. Other: None. IMPRESSION: 1. Mildly thickened gallbladder with stones but no adjacent fluid or positive sonographic Murphy's sign. 2. Differential diagnosis is chronic cholecystitis, pain-medicated acute cholecystitis, and wall thickening related to hepatic dysfunction and/or passive congestion, or reactive from adjacent inflammatory process. 3. Mild increased liver echogenicity of steatosis. Electronically Signed   By: Almira Bar M.D.   On: 03/09/2021 01:07    Procedures Procedures    Medications Ordered in ED Medications  enoxaparin (LOVENOX) injection 40 mg (has no administration in time range)  dextrose 5 %-0.9 % sodium chloride infusion ( Intravenous New Bag/Given 03/09/21 0403)  traZODone (DESYREL) tablet 25 mg (has no administration in time range)  magnesium hydroxide (MILK OF MAGNESIA) suspension 30 mL (has no administration in time range)  ondansetron (ZOFRAN) tablet 4 mg (has no administration in time range)    Or  ondansetron  (ZOFRAN) injection 4 mg (has no administration in time range)  morphine (PF) 2 MG/ML injection 2 mg (has no administration in time range)  ondansetron (ZOFRAN-ODT) disintegrating tablet 4 mg (4 mg Oral Given 03/08/21 2028)  lactated ringers bolus 1,000 mL (0 mLs Intravenous Stopped 03/09/21 0302)    ED Course/ Medical Decision Making/ A&P                           Medical Decision Making Amount and/or Complexity of Data Reviewed Labs: ordered. Radiology: ordered.  Risk Prescription drug management. Decision regarding hospitalization.  Eval for hepatobiliary abnormalities with her elevated bilirubin and liver enzymes. Not wanting pain meds at this time. Decreased appetite over last couple days, will give fluids as well. [place] as above. With her level of transaminitis, d/w EGS, will consult in AM, recommend Medicine/GI admit/consult.  Dr. Aldean Jewett with [facility] consulted per institutional protocol to see in AM. D/w Medicine for same.   Final Clinical Impression(s) / ED Diagnoses Final diagnoses:  Elevated liver enzymes    Rx / DC Orders ED Discharge Orders     None         Sulayman Manning, Barbara Cower, MD 03/09/21 [phone]
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
## 2021-03-09 NOTE — Plan of Care (Signed)

## 2021-03-09 NOTE — ED Notes (Signed)
Patient transported to Ultrasound 
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
## 2021-03-09 NOTE — Assessment & Plan Note (Addendum)
-   We will obtain a general surgery consult.  Dr. Sophronia Simas was notified about the patient and further follow-up can be done in a.m. with her. - We will follow bilirubin level. - GI consult will be obtained in a.m. as above and will defer to the gastroenterologist given common bile duct diameter of 3.9 mm to decide about further work-up including  MRCP. - The patient serum lipase level was within normal with no clinical or chemical evidence of pancreatitis.

## 2021-03-09 NOTE — Assessment & Plan Note (Addendum)
-   The patient will be admitted to a medical telemetry bed. - We will continue hydration with IV D5 normal saline. - Pain management will be provided. - We will obtain acute viral hepatitis panel. - We will follow LFTs. - GI consult will be obtained.  Ox[facility] is on-call for unassigned and can be called in AM. - We will defer further work-up to the gastroenterologist. - We will obtain coagulation profile.

## 2021-03-10 ENCOUNTER — Other Ambulatory Visit (HOSPITAL_COMMUNITY): Payer: Self-pay

## 2021-03-10 ENCOUNTER — Encounter (HOSPITAL_COMMUNITY): Payer: Self-pay | Admitting: Family Medicine

## 2021-03-10 ENCOUNTER — Inpatient Hospital Stay (HOSPITAL_COMMUNITY): Payer: Medicaid Other | Admitting: Certified Registered Nurse Anesthetist

## 2021-03-10 ENCOUNTER — Encounter (HOSPITAL_COMMUNITY)
Admission: EM | Disposition: A | Discharge status: Home / Self Care | Payer: Self-pay | Source: Home / Self Care | Attending: Internal Medicine

## 2021-03-10 ENCOUNTER — Other Ambulatory Visit: Payer: Self-pay

## 2021-03-10 DIAGNOSIS — K810 Acute cholecystitis: Secondary | ICD-10-CM

## 2021-03-10 HISTORY — PX: CHOLECYSTECTOMY: SHX55

## 2021-03-10 LAB — CBC WITH DIFFERENTIAL/PLATELET
Abs Immature Granulocytes: 0.02 10*3/uL (ref 0.00–0.07)
Basophils Absolute: 0 10*3/uL (ref 0.0–0.1)
Basophils Relative: 0 %
Eosinophils Absolute: 0.2 10*3/uL (ref 0.0–0.5)
Eosinophils Relative: 5 %
HCT: 35.5 % — ABNORMAL LOW (ref 36.0–46.0)
Hemoglobin: 11.7 g/dL — ABNORMAL LOW (ref 12.0–15.0)
Immature Granulocytes: 0 %
Lymphocytes Relative: 49 %
Lymphs Abs: 2.3 10*3/uL (ref 0.7–4.0)
MCH: 30.1 pg (ref 26.0–34.0)
MCHC: 33 g/dL (ref 30.0–36.0)
MCV: 91.3 fL (ref 80.0–100.0)
Monocytes Absolute: 0.3 10*3/uL (ref 0.1–1.0)
Monocytes Relative: 6 %
Neutro Abs: 1.9 10*3/uL (ref 1.7–7.7)
Neutrophils Relative %: 40 %
Platelets: 218 10*3/uL (ref 150–400)
RBC: 3.89 MIL/uL (ref 3.87–5.11)
RDW: 12.7 % (ref 11.5–15.5)
WBC: 4.7 10*3/uL (ref 4.0–10.5)
nRBC: 0 % (ref 0.0–0.2)

## 2021-03-10 LAB — COMPREHENSIVE METABOLIC PANEL
ALT: 499 U/L — ABNORMAL HIGH (ref 0–44)
AST: 158 U/L — ABNORMAL HIGH (ref 15–41)
Albumin: 3 g/dL — ABNORMAL LOW (ref 3.5–5.0)
Alkaline Phosphatase: 120 U/L (ref 38–126)
Anion gap: 6 (ref 5–15)
BUN: 10 mg/dL (ref 6–20)
CO2: 23 mmol/L (ref 22–32)
Calcium: 8.7 mg/dL — ABNORMAL LOW (ref 8.9–10.3)
Chloride: 112 mmol/L — ABNORMAL HIGH (ref 98–111)
Creatinine, Ser: 1.16 mg/dL — ABNORMAL HIGH (ref 0.44–1.00)
GFR, Estimated: >60 mL/min (ref >60)
Glucose, Bld: 111 mg/dL — ABNORMAL HIGH (ref 70–99)
Potassium: 4.4 mmol/L (ref 3.5–5.1)
Sodium: 141 mmol/L (ref 135–145)
Total Bilirubin: 0.7 mg/dL (ref 0.3–1.2)
Total Protein: 6.2 g/dL — ABNORMAL LOW (ref 6.5–8.1)

## 2021-03-10 LAB — ACETAMINOPHEN LEVEL: Acetaminophen (Tylenol), Serum: <10 ug/mL — ABNORMAL LOW (ref 10–30)

## 2021-03-10 SURGERY — LAPAROSCOPIC CHOLECYSTECTOMY
Anesthesia: General | Site: Abdomen

## 2021-03-10 MED ORDER — PROPOFOL 10 MG/ML IV BOLUS
INTRAVENOUS | Status: DC | PRN
  Administered 2021-03-10: 150 mg via INTRAVENOUS
  Administered 2021-03-10: 50 mg via INTRAVENOUS

## 2021-03-10 MED ORDER — MIDAZOLAM HCL 2 MG/2ML IJ SOLN
INTRAMUSCULAR | Status: AC
  Filled 2021-03-10: qty 2

## 2021-03-10 MED ORDER — TRAMADOL HCL 50 MG PO TABS
50.0000 mg | ORAL_TABLET | Freq: Four times a day (QID) | ORAL | 0 refills | Status: DC | PRN
Start: 2021-03-10 — End: 2021-04-11
  Filled 2021-03-10: qty 15, 4d supply, fill #0

## 2021-03-10 MED ORDER — LACTATED RINGERS IV SOLN: INTRAVENOUS | Status: DC | PRN

## 2021-03-10 MED ORDER — KETOROLAC TROMETHAMINE 30 MG/ML IJ SOLN
INTRAMUSCULAR | Status: AC
  Filled 2021-03-10: qty 1

## 2021-03-10 MED ORDER — HYDROMORPHONE HCL 1 MG/ML IJ SOLN: 1.0000 mg | INTRAMUSCULAR | Status: DC | PRN

## 2021-03-10 MED ORDER — DEXAMETHASONE SODIUM PHOSPHATE 10 MG/ML IJ SOLN
INTRAMUSCULAR | Status: DC | PRN
Start: 2021-03-10 — End: 2021-03-10
  Administered 2021-03-10: 5 mg via INTRAVENOUS

## 2021-03-10 MED ORDER — BUPIVACAINE HCL (PF) 0.25 % IJ SOLN
INTRAMUSCULAR | Status: AC
  Filled 2021-03-10: qty 30

## 2021-03-10 MED ORDER — HYDROMORPHONE HCL 1 MG/ML IJ SOLN: 0.2500 mg | INTRAMUSCULAR | Status: DC | PRN

## 2021-03-10 MED ORDER — SODIUM CHLORIDE 0.9 % IR SOLN
Status: DC | PRN
  Administered 2021-03-10: 1000 mL

## 2021-03-10 MED ORDER — MIDAZOLAM HCL 5 MG/5ML IJ SOLN
INTRAMUSCULAR | Status: DC | PRN
  Administered 2021-03-10: 2 mg via INTRAVENOUS

## 2021-03-10 MED ORDER — OXYCODONE HCL 5 MG PO TABS
ORAL_TABLET | ORAL | Status: AC
  Administered 2021-03-10: 5 mg via ORAL
  Filled 2021-03-10: qty 1

## 2021-03-10 MED ORDER — ROCURONIUM BROMIDE 10 MG/ML (PF) SYRINGE
PREFILLED_SYRINGE | INTRAVENOUS | Status: DC | PRN
  Administered 2021-03-10: 50 mg via INTRAVENOUS

## 2021-03-10 MED ORDER — LIDOCAINE 2% (20 MG/ML) 5 ML SYRINGE
INTRAMUSCULAR | Status: AC
  Filled 2021-03-10: qty 5

## 2021-03-10 MED ORDER — SUGAMMADEX SODIUM 200 MG/2ML IV SOLN
INTRAVENOUS | Status: DC | PRN
  Administered 2021-03-10: 200 mg via INTRAVENOUS

## 2021-03-10 MED ORDER — ROCURONIUM BROMIDE 10 MG/ML (PF) SYRINGE
PREFILLED_SYRINGE | INTRAVENOUS | Status: AC
  Filled 2021-03-10: qty 10

## 2021-03-10 MED ORDER — INDIGOTINDISULFONATE SODIUM 8 MG/ML IJ SOLN
INTRAMUSCULAR | Status: DC | PRN
  Administered 2021-03-10: 1 mL via INTRAVENOUS

## 2021-03-10 MED ORDER — DEXAMETHASONE SODIUM PHOSPHATE 10 MG/ML IJ SOLN
INTRAMUSCULAR | Status: AC
  Filled 2021-03-10: qty 1

## 2021-03-10 MED ORDER — KETOROLAC TROMETHAMINE 30 MG/ML IJ SOLN
INTRAMUSCULAR | Status: DC | PRN
  Administered 2021-03-10: 30 mg via INTRAVENOUS

## 2021-03-10 MED ORDER — IBUPROFEN 600 MG PO TABS
600.0000 mg | ORAL_TABLET | Freq: Four times a day (QID) | ORAL | Status: DC | PRN
  Administered 2021-03-10 – 2021-03-11 (×2): 600 mg via ORAL
  Filled 2021-03-10 (×3): qty 1

## 2021-03-10 MED ORDER — CHLORHEXIDINE GLUCONATE 0.12 % MT SOLN
OROMUCOSAL | Status: AC
  Administered 2021-03-10: 15 mL
  Filled 2021-03-10: qty 15

## 2021-03-10 MED ORDER — ONDANSETRON HCL 4 MG/2ML IJ SOLN: 4.0000 mg | Freq: Four times a day (QID) | INTRAMUSCULAR | Status: DC | PRN

## 2021-03-10 MED ORDER — OXYCODONE HCL 5 MG PO TABS: 5.0000 mg | ORAL_TABLET | Freq: Once | ORAL | Status: AC

## 2021-03-10 MED ORDER — 0.9 % SODIUM CHLORIDE (POUR BTL) OPTIME
TOPICAL | Status: DC | PRN
  Administered 2021-03-10: 1000 mL

## 2021-03-10 MED ORDER — NICOTINE 21 MG/24HR TD PT24
21.0000 mg | MEDICATED_PATCH | Freq: Every day | TRANSDERMAL | Status: DC
  Filled 2021-03-10: qty 1

## 2021-03-10 MED ORDER — ONDANSETRON HCL 4 MG/2ML IJ SOLN
INTRAMUSCULAR | Status: AC
  Filled 2021-03-10: qty 2

## 2021-03-10 MED ORDER — LIDOCAINE 2% (20 MG/ML) 5 ML SYRINGE
INTRAMUSCULAR | Status: DC | PRN
Start: 2021-03-10 — End: 2021-03-10
  Administered 2021-03-10: 60 mg via INTRAVENOUS

## 2021-03-10 MED ORDER — FENTANYL CITRATE (PF) 250 MCG/5ML IJ SOLN
INTRAMUSCULAR | Status: DC | PRN
  Administered 2021-03-10: 50 ug via INTRAVENOUS
  Administered 2021-03-10: 100 ug via INTRAVENOUS
  Administered 2021-03-10: 50 ug via INTRAVENOUS

## 2021-03-10 MED ORDER — PROPOFOL 10 MG/ML IV BOLUS
INTRAVENOUS | Status: AC
  Filled 2021-03-10: qty 20

## 2021-03-10 MED ORDER — BUPIVACAINE HCL 0.25 % IJ SOLN
INTRAMUSCULAR | Status: DC | PRN
Start: 2021-03-10 — End: 2021-03-10
  Administered 2021-03-10: 10 mL

## 2021-03-10 MED ORDER — FENTANYL CITRATE (PF) 250 MCG/5ML IJ SOLN
INTRAMUSCULAR | Status: AC
  Filled 2021-03-10: qty 5

## 2021-03-10 MED ORDER — IBUPROFEN 600 MG PO TABS: 600.0000 mg | ORAL_TABLET | ORAL | Status: DC | PRN

## 2021-03-10 MED ORDER — TRAMADOL HCL 50 MG PO TABS
50.0000 mg | ORAL_TABLET | Freq: Four times a day (QID) | ORAL | Status: DC | PRN
  Filled 2021-03-10: qty 1

## 2021-03-10 MED ORDER — DEXTROSE-NACL 5-0.9 % IV SOLN: INTRAVENOUS | Status: DC

## 2021-03-10 MED ORDER — ONDANSETRON 4 MG PO TBDP: 4.0000 mg | ORAL_TABLET | Freq: Four times a day (QID) | ORAL | Status: DC | PRN

## 2021-03-10 MED ORDER — ONDANSETRON HCL 4 MG/2ML IJ SOLN
INTRAMUSCULAR | Status: DC | PRN
  Administered 2021-03-10: 4 mg via INTRAVENOUS

## 2021-03-10 SURGICAL SUPPLY — 47 items
APPLIER CLIP 5 13 M/L LIGAMAX5 (MISCELLANEOUS)
BAG COUNTER SPONGE SURGICOUNT (BAG) ×3 IMPLANT
CANISTER SUCT 3000ML PPV (MISCELLANEOUS) ×3 IMPLANT
CHLORAPREP W/TINT 26 (MISCELLANEOUS) ×3 IMPLANT
CLIP APPLIE 5 13 M/L LIGAMAX5 (MISCELLANEOUS) IMPLANT
CLIP LIGATING HEMO O LOK GREEN (MISCELLANEOUS) ×5 IMPLANT
COVER MAYO STAND STRL (DRAPES) ×1 IMPLANT
COVER SURGICAL LIGHT HANDLE (MISCELLANEOUS) ×3 IMPLANT
COVER TRANSDUCER ULTRASND (DRAPES) ×1 IMPLANT
DERMABOND ADVANCED (GAUZE/BANDAGES/DRESSINGS) ×1
DERMABOND ADVANCED .7 DNX12 (GAUZE/BANDAGES/DRESSINGS) ×2 IMPLANT
DRAPE C-ARM 42X120 X-RAY (DRAPES) ×1 IMPLANT
ELECT REM PT RETURN 9FT ADLT (ELECTROSURGICAL) ×3
ELECTRODE REM PT RTRN 9FT ADLT (ELECTROSURGICAL) ×2 IMPLANT
GLOVE SURG SYN 7.5  E (GLOVE) ×1
GLOVE SURG SYN 7.5 E (GLOVE) ×2 IMPLANT
GLOVE SURG SYN 7.5 PF PI (GLOVE) ×1 IMPLANT
GOWN STRL REUS W/ TWL LRG LVL3 (GOWN DISPOSABLE) ×5 IMPLANT
GOWN STRL REUS W/ TWL XL LVL3 (GOWN DISPOSABLE) ×2 IMPLANT
GOWN STRL REUS W/TWL LRG LVL3 (GOWN DISPOSABLE) ×3
GOWN STRL REUS W/TWL XL LVL3 (GOWN DISPOSABLE) ×1
GRASPER SUT TROCAR 14GX15 (MISCELLANEOUS) ×3 IMPLANT
IV CATH 14GX2 1/4 (CATHETERS) ×1 IMPLANT
KIT BASIN OR (CUSTOM PROCEDURE TRAY) ×3 IMPLANT
KIT TURNOVER KIT B (KITS) ×3 IMPLANT
NDL INSUFFLATION 14GA 120MM (NEEDLE) ×1 IMPLANT
NEEDLE INSUFFLATION 14GA 120MM (NEEDLE) ×3 IMPLANT
NS IRRIG 1000ML POUR BTL (IV SOLUTION) ×3 IMPLANT
PAD ARMBOARD 7.5X6 YLW CONV (MISCELLANEOUS) ×6 IMPLANT
POUCH LAPAROSCOPIC INSTRUMENT (MISCELLANEOUS) ×3 IMPLANT
POUCH RETRIEVAL ECOSAC 10 (ENDOMECHANICALS) ×1 IMPLANT
POUCH RETRIEVAL ECOSAC 10MM (ENDOMECHANICALS) ×1
SCISSORS LAP 5X35 DISP (ENDOMECHANICALS) ×3 IMPLANT
SET CHOLANGIOGRAPHY FRANKLIN (SET/KITS/TRAYS/PACK) ×1 IMPLANT
SET IRRIG TUBING LAPAROSCOPIC (IRRIGATION / IRRIGATOR) ×3 IMPLANT
SET TUBE SMOKE EVAC HIGH FLOW (TUBING) ×3 IMPLANT
SLEEVE ENDOPATH XCEL 5M (ENDOMECHANICALS) ×5 IMPLANT
SPECIMEN JAR SMALL (MISCELLANEOUS) ×3 IMPLANT
STOPCOCK 4 WAY LG BORE MALE ST (IV SETS) ×1 IMPLANT
SUT MNCRL AB 4-0 PS2 18 (SUTURE) ×3 IMPLANT
TOWEL GREEN STERILE (TOWEL DISPOSABLE) ×3 IMPLANT
TOWEL GREEN STERILE FF (TOWEL DISPOSABLE) ×3 IMPLANT
TRAY LAPAROSCOPIC MC (CUSTOM PROCEDURE TRAY) ×3 IMPLANT
TROCAR XCEL NON-BLD 11X100MML (ENDOMECHANICALS) ×3 IMPLANT
TROCAR XCEL NON-BLD 5MMX100MML (ENDOMECHANICALS) ×3 IMPLANT
WARMER LAPAROSCOPE (MISCELLANEOUS) ×3 IMPLANT
WATER STERILE IRR 1000ML POUR (IV SOLUTION) ×3 IMPLANT

## 2021-03-10 NOTE — Progress Notes (Addendum)
PROGRESS NOTE    Brooke Howell  TKZ:[id number] DOB: 12-13-1990 DOA: 03/08/2021 PCP: Pcp, No   Brief Narrative:  Brooke Howell is a 31 y.o. obese African-American female with no chronic medical problems, presents emergency room with acute onset of epigastric and right upper quadrant abdominal pain felt as pressure which which has been going on since Friday (a day and half).  She has been having intermittent pain that is not as intense as today's about every couple weeks and it would resolve spontaneously.  This time she had associated nausea and significant anorexia.  She had recent pelvic ultrasound and labs by her obstetrician that were unremarkable.  No new recent medications.  She denied any recent blood transfusions.  Her intermittent pain started 6 months after her last baby in Dec 2021 on an intermittent basis.   Assessment & Plan:   Principal Problem:   Acute hepatitis Active Problems:   Cholelithiasis  Acute versus acute on chronic hepatitis- (present on admission) -Unclear etiology, CT abdomen shows cholelithiasis without obstruction -Right upper quadrant ultrasound shows thickened gallbladder without adjacent fluid or Murphy's -Patient indicates somewhat chronic abdominal discomfort -MRCP pending again shows cholelithiasis with diffuse gallbladder wall thickening without inflammatory changes -no evidence of biliary ductal dilation choledocholithiasis or hepatic mass -Hepatitis panel nonreactive -AST ALT bilirubin downtrending appropriately; alk phos stable within normal limits -Acetaminophen level ordered but patient keeps refusing additional labs -Tentative plan for lap chole today with general surgery given ongoing cholelithiasis thought to be etiology of above   Cholelithiasis versus cholecystitis, POA -Imaging remarkable for gallbladder wall thickening concerning for acute versus chronic cholecystitis of unknown etiology -General surgery following, lap chole planned  today -MRCP nondiagnostic  DVT prophylaxis: lovenox Code Status: Full Family Communication: None present  Status is: Inpatient  Dispo: The patient is from: Home              Anticipated d/c is to: Home              Anticipated d/c date is: 24 to 48 hours              Patient currently not medically stable for discharge  Consultants:  General surgery  Procedures:  Lap chole 03/10/2021  Antimicrobials:  Unasyn  Subjective: No acute issues or events overnight, feeling markedly improved but not yet back to baseline denies nausea vomiting diarrhea constipation headache fevers chills or chest pain  Objective: Vitals:   03/10/21 1205 03/10/21 1215 03/10/21 1220 03/10/21 1239  BP: 117/63  125/73 116/74  Pulse: 68 75 79 62  Resp: _0 Temp:   (!) 97 F (36.1 C) 97.8 F (36.6 C)  TempSrc:    Oral  SpO2: 100% 99% 100% 99%  Weight:      Height:        Intake/Output Summary (Last 24 hours) at 03/10/2021 1325 Last data filed at 03/10/2021 1309 Gross per 24 hour  Intake 1355.06 ml  Output 5 ml  Net 1350.06 ml    Filed Weights   03/09/21 0514 03/10/21 0930  Weight: 97.6 kg 97.1 kg    Examination:  General exam: Appears calm and comfortable  Respiratory system: Clear to auscultation. Respiratory effort normal. Cardiovascular system: S1 & S2 heard, RRR. No JVD, murmurs, rubs, gallops or clicks. No pedal edema. Gastrointestinal system: Abdomen is nondistended, soft and nontender. No organomegaly or masses felt. Normal bowel sounds heard. Central nervous system: Alert and oriented. No focal neurological deficits. Extremities: Symmetric 5 x  5 power. Skin: No rashes, lesions or ulcers Psychiatry: Judgement and insight appear normal. Mood & affect appropriate.   Data Reviewed: I have personally reviewed following labs and imaging studies  CBC: Recent Labs  Lab 03/08/21 2044 03/09/21 0457 03/10/21 0633  WBC 4.0 3.6* 4.7  NEUTROABS  --   --  1.9  HGB 13.0 11.7*  11.7*  HCT 40.5 35.8* 35.5*  MCV 91.4 90.9 91.3  PLT 278 207 299    Basic Metabolic Panel: Recent Labs  Lab 03/08/21 2044 03/09/21 0457 03/10/21 0633  NA 140 141 141  K 4.3 4.0 4.4  CL 105 109 112*  CO2 _0 GLUCOSE 109* 94 111*  BUN _1 CREATININE 1.14* 1.04* 1.16*  CALCIUM 9.5 8.9 8.7*    GFR: Estimated Creatinine Clearance: 84.9 mL/min (A) (by C-G formula based on SCr of 1.16 mg/dL (H)). Liver Function Tests: Recent Labs  Lab 03/08/21 2044 03/09/21 0457 03/10/21 0633  AST 963* 540* 158*  ALT 902* 730* 499*  ALKPHOS 110 111 120  BILITOT 3.2* 2.9* 0.7  PROT 7.4 6.4* 6.2*  ALBUMIN 3.8 3.3* 3.0*    Recent Labs  Lab 03/08/21 2044  LIPASE 27    No results for input(s): AMMONIA in the last 168 hours. Coagulation Profile: Recent Labs  Lab 03/09/21 0645  INR 1.0    Cardiac Enzymes: No results for input(s): CKTOTAL, CKMB, CKMBINDEX, TROPONINI in the last 168 hours. BNP (last 3 results) No results for input(s): PROBNP in the last 8760 hours. HbA1C: No results for input(s): HGBA1C in the last 72 hours. CBG: No results for input(s): GLUCAP in the last 168 hours. Lipid Profile: No results for input(s): CHOL, HDL, LDLCALC, TRIG, CHOLHDL, LDLDIRECT in the last 72 hours. Thyroid Function Tests: No results for input(s): TSH, T4TOTAL, FREET4, T3FREE, THYROIDAB in the last 72 hours. Anemia Panel: No results for input(s): VITAMINB12, FOLATE, FERRITIN, TIBC, IRON, RETICCTPCT in the last 72 hours. Sepsis Labs: No results for input(s): PROCALCITON, LATICACIDVEN in the last 168 hours.  Recent Results (from the past 240 hour(s))  Resp Panel by RT-PCR (Flu A&B, Covid) Nasopharyngeal Swab     Status: None   Collection Time: 03/09/21  2:36 AM   Specimen: Nasopharyngeal Swab; Nasopharyngeal(NP) swabs in vial transport medium  Result Value Ref Range Status   SARS Coronavirus 2 by RT PCR NEGATIVE NEGATIVE Final    Comment: (NOTE) SARS-CoV-2 target nucleic  acids are NOT DETECTED.  The SARS-CoV-2 RNA is generally detectable in upper respiratory specimens during the acute phase of infection. The lowest concentration of SARS-CoV-2 viral copies this assay can detect is 138 copies/mL. A negative result does not preclude SARS-Cov-2 infection and should not be used as the sole basis for treatment or other patient management decisions. A negative result may occur with  improper specimen collection/handling, submission of specimen other than nasopharyngeal swab, presence of viral mutation(s) within the areas targeted by this assay, and inadequate number of viral copies(<138 copies/mL). A negative result must be combined with clinical observations, patient history, and epidemiological information. The expected result is Negative.  Fact Sheet for Patients:  EntrepreneurPulse.com.au  Fact Sheet for Healthcare Providers:  IncredibleEmployment.be  This test is no t yet approved or cleared by the [place] FDA and  has been authorized for detection and/or diagnosis of SARS-CoV-2 by FDA under an Emergency Use Authorization (EUA). This EUA will remain  in effect (meaning this test can be used) for the duration of the  COVID-19 declaration under Section 564(b)(1) of the Act, 21 U.S.C.section 360bbb-3(b)(1), unless the authorization is terminated  or revoked sooner.       Influenza A by PCR NEGATIVE NEGATIVE Final   Influenza B by PCR NEGATIVE NEGATIVE Final    Comment: (NOTE) The Xpert Xpress SARS-CoV-2/FLU/RSV plus assay is intended as an aid in the diagnosis of influenza from Nasopharyngeal swab specimens and should not be used as a sole basis for treatment. Nasal washings and aspirates are unacceptable for Xpert Xpress SARS-CoV-2/FLU/RSV testing.  Fact Sheet for Patients: EntrepreneurPulse.com.au  Fact Sheet for Healthcare Providers: IncredibleEmployment.be  This  test is not yet approved or cleared by the [place] FDA and has been authorized for detection and/or diagnosis of SARS-CoV-2 by FDA under an Emergency Use Authorization (EUA). This EUA will remain in effect (meaning this test can be used) for the duration of the COVID-19 declaration under Section 564(b)(1) of the Act, 21 U.S.C. section 360bbb-3(b)(1), unless the authorization is terminated or revoked.  Performed at [facility], [place] [address] [place], [place] [address]      Radiology Studies: MR ABDOMEN MRCP W WO CONTAST  Result Date: 03/09/2021 CLINICAL DATA:  Jaundice. Abdominal pain. Elevated liver function tests. EXAM: MRI ABDOMEN WITHOUT AND WITH CONTRAST (INCLUDING MRCP) TECHNIQUE: Multiplanar multisequence MR imaging of the abdomen was performed both before and after the administration of intravenous contrast. Heavily T2-weighted images of the biliary and pancreatic ducts were obtained, and three-dimensional MRCP images were rendered by post processing. CONTRAST:  9.28m GADAVIST GADOBUTROL 1 MMOL/ML IV SOLN COMPARISON:  Ultrasound on 03/09/2021 FINDINGS: Lower chest: No acute findings. Hepatobiliary: No hepatic masses identified. No evidence of steatosis on chemical shift imaging. The gallbladder is incompletely distended. A few tiny gallstones are seen. Mild diffuse gallbladder wall thickening is also demonstrated, without adjacent pericholecystic inflammatory changes. No evidence of biliary ductal dilatation or choledocholithiasis. Pancreas:  No mass or inflammatory changes. Spleen:  Within normal limits in size and appearance. Adrenals/Urinary Tract: No masses identified. No evidence of hydronephrosis. Stomach/Bowel: Unremarkable. Vascular/Lymphatic: No pathologically enlarged lymph nodes identified. No acute vascular findings. Other:  None. Musculoskeletal:  No suspicious bone lesions identified. IMPRESSION: Cholelithiasis. Mild diffuse gallbladder wall thickening, without  dilatation or pericholecystic inflammatory changes. This finding is nonspecific. Chronic cholecystitis cannot be excluded. No evidence of biliary ductal dilatation or choledocholithiasis. No evidence of hepatic mass or steatosis. Electronically Signed   By: JMarlaine HindM.D.   On: 03/09/2021 13:30   UKoreaAbdomen Limited RUQ (LIVER/GB)  Result Date: 03/09/2021 CLINICAL DATA:  Elevated liver enzymes. EXAM: ULTRASOUND ABDOMEN LIMITED RIGHT UPPER QUADRANT COMPARISON:  None. FINDINGS: Gallbladder: There are small layering stones in the gallbladder. No pericholecystic fluid is seen with the free wall show mild thickening up to 3.8 mm. There is no positive sonographic Murphy's sign. Largest calculus is 7 mm. Common bile duct: Diameter: 3.9 mm.  No intrahepatic biliary prominence. Liver: No focal lesion identified. There is mild increased hepatic echogenicity consistent with steatosis. Portal vein is patent on color Doppler imaging with normal direction of blood flow towards the liver. Other: None. IMPRESSION: 1. Mildly thickened gallbladder with stones but no adjacent fluid or positive sonographic Murphy's sign. 2. Differential diagnosis is chronic cholecystitis, pain-medicated acute cholecystitis, and wall thickening related to hepatic dysfunction and/or passive congestion, or reactive from adjacent inflammatory process. 3. Mild increased liver echogenicity of steatosis. Electronically Signed   By: KTelford NabM.D.   On: 03/09/2021 01:07    Scheduled  Meds:  enoxaparin (LOVENOX) injection  40 mg Subcutaneous Q24H   Continuous Infusions:  dextrose 5 % and 0.9% NaCl 100 mL/hr at 03/09/21 0539   dextrose 5 % and 0.9% NaCl       LOS: 1 day   Time spent: 56mn  Monetta Lick C Paulyne Mooty, DO [facility]  If 7PM-7AM, please contact night-coverage [email or web address]  03/10/2021, 1:25 PM

## 2021-03-10 NOTE — Progress Notes (Signed)
Subjective: CC: Patient reports intermittent epigastric/RUQ pain for the last year. More severe pain on Friday that lasted for longer than her normal episodes and was assc with nausea. Pain improved this am. Still some mild RUQ tenderness.   Reports her 44 month old daughter is sick at home and would like to get home as early as possible.   Objective: Vital signs in last 24 hours: Temp:  [98.1 F (36.7 C)-98.8 F (37.1 C)] 98.8 F (37.1 C) (02/27 0535) Pulse Rate:  [67-80] 68 (02/27 0535) Resp:  [18] 18 (02/27 0535) BP: (104-124)/(57-85) 124/85 (02/27 0535) SpO2:  [98 %-100 %] 98 % (02/27 0535) Last BM Date : 03/08/21  Intake/Output from previous day: 02/26 0701 - 02/27 0700 In: 1065.1 [P.O.:480; I.V.:585.1] Out: 0  Intake/Output this shift: No intake/output data recorded.  PE: Gen:  Alert, NAD, pleasant Pulm:  rate and effort normal Abd: Soft, ND, RUQ tenderness, +BS Psych: A&Ox3  Skin: no rashes noted, warm and dry  Lab Results:  Recent Labs    03/09/21 0457 03/10/21 0633  WBC 3.6* 4.7  HGB 11.7* 11.7*  HCT 35.8* 35.5*  PLT 207 218   BMET Recent Labs    03/09/21 0457 03/10/21 0633  NA 141 141  K 4.0 4.4  CL 109 112*  CO2 23 23  GLUCOSE 94 111*  BUN 10 10  CREATININE 1.04* 1.16*  CALCIUM 8.9 8.7*   PT/INR Recent Labs    03/09/21 0645  LABPROT 13.1  INR 1.0   CMP     Component Value Date/Time   NA 141 03/10/2021 0633   K 4.4 03/10/2021 0633   CL 112 (H) 03/10/2021 0633   CO2 23 03/10/2021 0633   GLUCOSE 111 (H) 03/10/2021 0633   BUN 10 03/10/2021 0633   CREATININE 1.16 (H) 03/10/2021 0633   CREATININE 1.13 (H) 06/14/2018 1129   CALCIUM 8.7 (L) 03/10/2021 0633   PROT 6.2 (L) 03/10/2021 0633   ALBUMIN 3.0 (L) 03/10/2021 0633   AST 158 (H) 03/10/2021 0633   ALT 499 (H) 03/10/2021 0633   ALKPHOS 120 03/10/2021 0633   BILITOT 0.7 03/10/2021 0633   GFRNONAA >60 03/10/2021 0633   GFRAA >60 10/03/2019 0513   Lipase     Component  Value Date/Time   LIPASE 27 03/08/2021 2044    Studies/Results: MR ABDOMEN MRCP W WO CONTAST  Result Date: 03/09/2021 CLINICAL DATA:  Jaundice. Abdominal pain. Elevated liver function tests. EXAM: MRI ABDOMEN WITHOUT AND WITH CONTRAST (INCLUDING MRCP) TECHNIQUE: Multiplanar multisequence MR imaging of the abdomen was performed both before and after the administration of intravenous contrast. Heavily T2-weighted images of the biliary and pancreatic ducts were obtained, and three-dimensional MRCP images were rendered by post processing. CONTRAST:  9.80mL GADAVIST GADOBUTROL 1 MMOL/ML IV SOLN COMPARISON:  Ultrasound on 03/09/2021 FINDINGS: Lower chest: No acute findings. Hepatobiliary: No hepatic masses identified. No evidence of steatosis on chemical shift imaging. The gallbladder is incompletely distended. A few tiny gallstones are seen. Mild diffuse gallbladder wall thickening is also demonstrated, without adjacent pericholecystic inflammatory changes. No evidence of biliary ductal dilatation or choledocholithiasis. Pancreas:  No mass or inflammatory changes. Spleen:  Within normal limits in size and appearance. Adrenals/Urinary Tract: No masses identified. No evidence of hydronephrosis. Stomach/Bowel: Unremarkable. Vascular/Lymphatic: No pathologically enlarged lymph nodes identified. No acute vascular findings. Other:  None. Musculoskeletal:  No suspicious bone lesions identified. IMPRESSION: Cholelithiasis. Mild diffuse gallbladder wall thickening, without dilatation or pericholecystic inflammatory changes. This  finding is nonspecific. Chronic cholecystitis cannot be excluded. No evidence of biliary ductal dilatation or choledocholithiasis. No evidence of hepatic mass or steatosis. Electronically Signed   By: Danae Orleans M.D.   On: 03/09/2021 13:30   US Abdomen Limited RUQ (LIVER/GB)  Result Date: 03/09/2021 CLINICAL DATA:  Elevated liver enzymes. EXAM: ULTRASOUND ABDOMEN LIMITED RIGHT UPPER QUADRANT  COMPARISON:  None. FINDINGS: Gallbladder: There are small layering stones in the gallbladder. No pericholecystic fluid is seen with the free wall show mild thickening up to 3.8 mm. There is no positive sonographic Murphy's sign. Largest calculus is 7 mm. Common bile duct: Diameter: 3.9 mm.  No intrahepatic biliary prominence. Liver: No focal lesion identified. There is mild increased hepatic echogenicity consistent with steatosis. Portal vein is patent on color Doppler imaging with normal direction of blood flow towards the liver. Other: None. IMPRESSION: 1. Mildly thickened gallbladder with stones but no adjacent fluid or positive sonographic Murphy's sign. 2. Differential diagnosis is chronic cholecystitis, pain-medicated acute cholecystitis, and wall thickening related to hepatic dysfunction and/or passive congestion, or reactive from adjacent inflammatory process. 3. Mild increased liver echogenicity of steatosis. Electronically Signed   By: Almira Bar M.D.   On: 03/09/2021 01:07    Anti-infectives: Anti-infectives (From admission, onward)    Start     Dose/Rate Route Frequency Ordered Stop   03/09/21 1200  ampicillin-sulbactam (UNASYN) 1.5 g in sodium chloride 0.9 % 100 mL IVPB        1.5 g 200 mL/hr over 30 Minutes Intravenous Every 6 hours 03/09/21 1024          Assessment/Plan Cholelithiasis  Elevated LFT's - Suspect she may have passed a gallstone. MRCP negative for CBD stone and LFT's are downtrending. Hepatitis panel negative. There was diffuse GB wall thickening on imaging without pericholecystitis fluid that could suggest chronic cholecystitis - Recommend Laparoscopic Cholecystectomy.  I have explained the procedure, risks, and aftercare of Laparoscopic cholecystectomy with possible IOC.  Risks include but are not limited to anesthesia (MI, CVA, death), bleeding, infection, wound problems, hernia, bile leak, injury to common bile duct and surrounding structures. She seems to  understand and agrees to proceed.  FEN - NPO, IVF VTE - SCDs, Lovenox ID - Unasyn  This care required moderate level of medical decision making.    LOS: 1 day    Jacinto Halim , [facility] 03/10/2021, 8:45 AM Please see Amion for pager number during day hours 7:00am-4:30pm

## 2021-03-10 NOTE — Discharge Instructions (Signed)
[facility]  Please arrive at least 30 min before your appointment to complete your check in paperwork.  If you are unable to arrive 30 min prior to your appointment time we may have to cancel or reschedule you. LAPAROSCOPIC SURGERY: POST OP INSTRUCTIONS Always review your discharge instruction sheet given to you by the facility where your surgery was performed. IF YOU HAVE DISABILITY OR FAMILY LEAVE FORMS, YOU MUST BRING THEM TO THE OFFICE FOR PROCESSING.   DO NOT GIVE THEM TO YOUR DOCTOR.  PAIN CONTROL  First take acetaminophen (Tylenol) AND/or ibuprofen (Advil) to control your pain after surgery.  Follow directions on package.  Taking acetaminophen (Tylenol) and/or ibuprofen (Advil) regularly after surgery will help to control your pain and lower the amount of prescription pain medication you may need.  You should not take more than 4,000 mg (4 grams) of acetaminophen (Tylenol) in 24 hours.  You should not take ibuprofen (Advil), aleve, motrin, naprosyn or other NSAIDS if you have a history of stomach ulcers or chronic kidney disease.  A prescription for pain medication may be given to you upon discharge.  Take your pain medication as prescribed, if you still have uncontrolled pain after taking acetaminophen (Tylenol) or ibuprofen (Advil). Use ice packs to help control pain. If you need a refill on your pain medication, please contact your pharmacy.  They will contact our office to request authorization. Prescriptions will not be filled after 5pm or on week-ends.  HOME MEDICATIONS Take your usually prescribed medications unless otherwise directed.  DIET You should follow a light diet the first few days after arrival home.  Be sure to include lots of fluids daily. Avoid fatty, fried foods.   CONSTIPATION It is common to experience some constipation after surgery and if you are taking pain medication.  Increasing fluid intake and taking a stool softener (such as Colace)  will usually help or prevent this problem from occurring.  A mild laxative (Milk of Magnesia or Miralax) should be taken according to package instructions if there are no bowel movements after 48 hours.  WOUND/INCISION CARE Most patients will experience some swelling and bruising in the area of the incisions.  Ice packs will help.  Swelling and bruising can take several days to resolve.  Unless discharge instructions indicate otherwise, follow guidelines below  STERI-STRIPS - you may remove your outer bandages 48 hours after surgery, and you may shower at that time.  You have steri-strips (small skin tapes) in place directly over the incision.  These strips should be left on the skin for 7-10 days.   DERMABOND/SKIN GLUE - you may shower in 24 hours.  The glue will flake off over the next 2-3 weeks. Any sutures or staples will be removed at the office during your follow-up visit.  ACTIVITIES You may resume regular (light) daily activities beginning the next day--such as daily self-care, walking, climbing stairs--gradually increasing activities as tolerated.  You may have sexual intercourse when it is comfortable.  Refrain from any heavy lifting or straining until approved by your doctor. You may drive when you are no longer taking prescription pain medication, you can comfortably wear a seatbelt, and you can safely maneuver your car and apply brakes.  FOLLOW-UP You should see your doctor in the office for a follow-up appointment approximately 2-3 weeks after your surgery.  You should have been given your post-op/follow-up appointment when your surgery was scheduled.  If you did not receive a post-op/follow-up appointment, make sure   that you call for this appointment within a day or two after you arrive home to insure a convenient appointment time.   WHEN TO CALL YOUR DOCTOR: Fever over 101.0 Inability to urinate Continued bleeding from incision. Increased pain, redness, or drainage from the  incision. Increasing abdominal pain  The clinic staff is available to answer your questions during regular business hours.  Please don't hesitate to call and ask to speak to one of the nurses for clinical concerns.  If you have a medical emergency, go to the nearest emergency room or call 911.  A surgeon from [facility] is always on call at the hospital. [address], [address], [place], [place]  [address] ? [address] [address], [place], [place]   [address] [phone] ? [phone] ? FAX [phone]  

## 2021-03-10 NOTE — Op Note (Signed)
03/10/2021  11:14 AM  PATIENT:  Brooke Howell  31 y.o. female  PRE-OPERATIVE DIAGNOSIS:  Acute Cholecystitis  POST-OPERATIVE DIAGNOSIS:  Acute Cholecystitis  PROCEDURE:  Procedure(s): LAPAROSCOPIC CHOLECYSTECTOMY WITH ICG DYE (N/A)  SURGEON:  Surgeon(s) and Role:    Axel Filler, MD - Primary  PHYSICIAN ASSISTANT: Trixie Deis PA-C    ANESTHESIA:   local and general  EBL:  minimal   BLOOD ADMINISTERED:none  DRAINS: none   LOCAL MEDICATIONS USED:  BUPIVICAINE   SPECIMEN:  Source of Specimen:  gallbladder  DISPOSITION OF SPECIMEN:  PATHOLOGY  COUNTS:  YES  TOURNIQUET:  * No tourniquets in log *  DICTATION: .Dragon Dictation  The patient was taken to the operating and placed in the supine position with bilateral SCDs in place.  The patient was prepped and draped in the usual sterile fashion. A time out was called and all facts were verified. A pneumoperitoneum was obtained via A Veress needle technique to a pressure of 73mm of mercury.  A 64mm trochar was then placed in the right upper quadrant under visualization, and there were no injuries to any abdominal organs. A 11 mm port was then placed in the umbilical region after infiltrating with local anesthesia under direct visualization. A second and third epigastric port and right lower quadrant port placement under direct visualization, respectively.    The gallbladder was identified and retracted, the peritoneum was then sharply dissected from the gallbladder and this dissection was carried down to Calot's triangle. The cystic duct was identified and stripped away circumferentially and seen going into the gallbladder 360, the critical angle was obtained.  The green light and ICG dye confirmed the common bile duct placement. 2 clips were placed proximally one distally and the cystic duct transected. The cystic artery was identified and 2 clips placed proximally and one distally and transected.  We then proceeded to  remove the gallbladder off the hepatic fossa with Bovie cautery. A retrieval bag was then placed in the abdomen and gallbladder placed in the bag. The hepatic fossa was then reexamined and hemostasis was achieved with Bovie cautery and was excellent at the end of the case.   The subhepatic fossa and perihepatic fossa was then irrigated until the effluent was clear.  The gallbladder and bag were removed from the abdominal cavity. The 11 mm trocar fascia was reapproximated with the Endo Close #1 Vicryl 2.  The pneumoperitoneum was evacuated and all trochars removed under direct visulalization.  The skin was then closed with 4-0 Monocryl and the skin dressed with Dermabond.    The patient was awaken from general anesthesia and taken to the recovery room in stable condition.   PLAN OF CARE: Admit for overnight observation  PATIENT DISPOSITION:  PACU - hemodynamically stable.   Delay start of Pharmacological VTE agent (>24hrs) due to surgical blood loss or risk of bleeding: not applicable

## 2021-03-10 NOTE — Anesthesia Procedure Notes (Signed)
Procedure Name: Intubation Date/Time: 03/10/2021 10:40 AM Performed by: Waynard Edwards, CRNA Pre-anesthesia Checklist: Patient identified, Emergency Drugs available, Suction available and Patient being monitored Patient Re-evaluated:Patient Re-evaluated prior to induction Oxygen Delivery Method: Circle system utilized Preoxygenation: Pre-oxygenation with 100% oxygen Induction Type: IV induction Ventilation: Mask ventilation without difficulty Laryngoscope Size: Miller and 2 Grade View: Grade I Tube type: Oral Tube size: 7.0 mm Number of attempts: 1 Airway Equipment and Method: Stylet Placement Confirmation: ETT inserted through vocal cords under direct vision, positive ETCO2 and breath sounds checked- equal and bilateral Secured at: 22 cm Tube secured with: Tape Dental Injury: Teeth and Oropharynx as per pre-operative assessment

## 2021-03-10 NOTE — Transfer of Care (Signed)
Immediate Anesthesia Transfer of Care Note  Patient: Brooke Howell  Procedure(s) Performed: LAPAROSCOPIC CHOLECYSTECTOMY WITH ICG DYE (Abdomen)  Patient Location: PACU  Anesthesia Type:General  Level of Consciousness: drowsy and patient cooperative  Airway & Oxygen Therapy: Patient Spontanous Breathing and Patient connected to nasal cannula oxygen  Post-op Assessment: Report given to RN and Post -op Vital signs reviewed and stable  Post vital signs: Reviewed and stable  Last Vitals:  Vitals Value Taken Time  BP 131/72 03/10/21 1136  Temp    Pulse 99 03/10/21 1136  Resp 26 03/10/21 1136  SpO2 100 % 03/10/21 1136  Vitals shown include unvalidated device data.  Last Pain:  Vitals:   03/10/21 0930  TempSrc: Oral  PainSc:          Complications: No notable events documented.

## 2021-03-10 NOTE — Anesthesia Postprocedure Evaluation (Signed)
Anesthesia Post Note  Patient: Brooke Howell  Procedure(s) Performed: LAPAROSCOPIC CHOLECYSTECTOMY WITH ICG DYE (Abdomen)     Patient location during evaluation: PACU Anesthesia Type: General Level of consciousness: awake and alert Pain management: pain level controlled Vital Signs Assessment: post-procedure vital signs reviewed and stable Respiratory status: spontaneous breathing, nonlabored ventilation and respiratory function stable Cardiovascular status: blood pressure returned to baseline and stable Postop Assessment: no apparent nausea or vomiting Anesthetic complications: no   No notable events documented.  Last Vitals:  Vitals:   03/10/21 1136 03/10/21 1151  BP: 131/72 121/77  Pulse: (!) 106 93  Resp: 19 14  Temp: 36.8 C   SpO2: 99% 99%    Last Pain:  Vitals:   03/10/21 1136  TempSrc:   PainSc: Asleep                 Ira Busbin,W. EDMOND

## 2021-03-10 NOTE — Progress Notes (Signed)
°  Transition of Care (TOC) Screening Note   Patient Details  Name: Brooke Howell Date of Birth: 1990/03/10   Transition of Care [facility]) CM/SW Contact:    Tom-Johnson, [name], RN Phone Number: 03/10/2021, 3:15 PM  Transition of Care Department [facility]) has reviewed patient and no TOC needs or recommendations have been identified at this time. TOC will continue to monitor patient advancement through interdisciplinary progression rounds. If new patient transition needs arise, please place a TOC consult.

## 2021-03-10 NOTE — Anesthesia Preprocedure Evaluation (Addendum)
Anesthesia Evaluation  Patient identified by MRN, date of birth, ID band Patient awake    Reviewed: Allergy & Precautions, H&P , NPO status , Patient's Chart, lab work & pertinent test results  Airway Mallampati: II  TM Distance: >3 FB Neck ROM: Full    Dental no notable dental hx. (+) Teeth Intact, Dental Advisory Given   Pulmonary neg pulmonary ROS, Current Smoker and Patient abstained from smoking.,    Pulmonary exam normal breath sounds clear to auscultation       Cardiovascular negative cardio ROS   Rhythm:Regular Rate:Normal     Neuro/Psych negative neurological ROS  negative psych ROS   GI/Hepatic negative GI ROS, Neg liver ROS,   Endo/Other  negative endocrine ROSdiabetes  Renal/GU negative Renal ROS  negative genitourinary   Musculoskeletal   Abdominal   Peds  Hematology negative hematology ROS (+)   Anesthesia Other Findings   Reproductive/Obstetrics negative OB ROS                            Anesthesia Physical Anesthesia Plan  ASA: 2  Anesthesia Plan: General   Post-op Pain Management:    Induction: Intravenous  PONV Risk Score and Plan: 3 and Ondansetron, Dexamethasone and Midazolam  Airway Management Planned: Oral ETT  Additional Equipment:   Intra-op Plan:   Post-operative Plan: Extubation in OR  Informed Consent: I have reviewed the patients History and Physical, chart, labs and discussed the procedure including the risks, benefits and alternatives for the proposed anesthesia with the patient or authorized representative who has indicated his/her understanding and acceptance.     Dental advisory given  Plan Discussed with: CRNA  Anesthesia Plan Comments:         Anesthesia Quick Evaluation

## 2021-03-11 ENCOUNTER — Other Ambulatory Visit (HOSPITAL_COMMUNITY): Payer: Self-pay

## 2021-03-11 ENCOUNTER — Encounter (HOSPITAL_COMMUNITY): Payer: Self-pay | Admitting: General Surgery

## 2021-03-11 LAB — COMPREHENSIVE METABOLIC PANEL
ALT: 356 U/L — ABNORMAL HIGH (ref 0–44)
AST: 80 U/L — ABNORMAL HIGH (ref 15–41)
Albumin: 3.1 g/dL — ABNORMAL LOW (ref 3.5–5.0)
Alkaline Phosphatase: 96 U/L (ref 38–126)
Anion gap: 7 (ref 5–15)
BUN: 10 mg/dL (ref 6–20)
CO2: 23 mmol/L (ref 22–32)
Calcium: 8.9 mg/dL (ref 8.9–10.3)
Chloride: 110 mmol/L (ref 98–111)
Creatinine, Ser: 0.97 mg/dL (ref 0.44–1.00)
GFR, Estimated: >60 mL/min (ref >60)
Glucose, Bld: 152 mg/dL — ABNORMAL HIGH (ref 70–99)
Potassium: 4.3 mmol/L (ref 3.5–5.1)
Sodium: 140 mmol/L (ref 135–145)
Total Bilirubin: 0.4 mg/dL (ref 0.3–1.2)
Total Protein: 6.4 g/dL — ABNORMAL LOW (ref 6.5–8.1)

## 2021-03-11 LAB — SURGICAL PATHOLOGY

## 2021-03-11 MED ORDER — NICOTINE 21 MG/24HR TD PT24
21.0000 mg | MEDICATED_PATCH | Freq: Every day | TRANSDERMAL | 0 refills | Status: DC
  Filled 2021-03-11: qty 28, 28d supply, fill #0

## 2021-03-11 MED ORDER — IBUPROFEN 600 MG PO TABS
600.0000 mg | ORAL_TABLET | Freq: Four times a day (QID) | ORAL | 0 refills | Status: DC | PRN
  Filled 2021-03-11: qty 30, 8d supply, fill #0

## 2021-03-11 MED ORDER — MENTHOL 3 MG MT LOZG
1.0000 | LOZENGE | OROMUCOSAL | Status: DC | PRN
  Administered 2021-03-11: 3 mg via ORAL
  Filled 2021-03-11: qty 9

## 2021-03-11 NOTE — Discharge Summary (Signed)
Physician Discharge Summary  Cinda Hara VFI:[id number] DOB: [birth year] DOA: 03/08/2021  PCP: Merryl Hacker, No  Admit date: 03/08/2021 Discharge date: 03/11/2021  Admitted From: Home Disposition:  Home  Recommendations for Outpatient Follow-up:  Follow up with Residency clinic in 1 to 2 weeks as scheduled Please obtain BMP/CBC in one week Follow up with general surgery as scheduled  Discharge Condition:Stable  CODE STATUS:Full  Diet recommendation: As tolerated    Brief/Interim Summary: Tharon Kitch is a 31 y.o. obese African-American female with no chronic medical problems, presents emergency room with acute onset of epigastric and right upper quadrant abdominal pain felt as pressure which which has been going on since Friday (a day and half).  She has been having intermittent pain that is not as intense as today's about every couple weeks and it would resolve spontaneously.  This time she had associated nausea and significant anorexia.  She had recent pelvic ultrasound and labs by her obstetrician that were unremarkable.  No new recent medications.  She denied any recent blood transfusions.  Her intermittent pain started 6 months after her last baby in Dec 2021 on an intermittent basis.   Discharge Diagnoses:  Principal Problem:   Acute hepatitis Active Problems:   Cholelithiasis  Acute versus acute on chronic hepatitis- (present on admission) -Unclear etiology, CT abdomen shows cholelithiasis without obstruction -likely previously passed choledocholithiasis -Right upper quadrant ultrasound shows thickened gallbladder without adjacent fluid or Murphy's -Patient indicates somewhat chronic abdominal discomfort -MRCP again shows cholelithiasis with diffuse gallbladder wall thickening without inflammatory changes -no evidence of biliary ductal dilation choledocholithiasis or hepatic mass -Hepatitis panel nonreactive -AST ALT bilirubin downtrending appropriately; alk phos stable within  normal limits   Cholelithiasis versus cholecystitis, POA -Lap chole 03/10/2021 tolerated well follow-up outpatient with general surgery as scheduled    Discharge Instructions  Discharge Instructions     Discharge patient   Complete by: As directed    Discharge disposition: 01-Home or Self Care   Discharge patient date: 03/11/2021      Allergies as of 03/11/2021       Reactions   Latex Itching, Swelling   Shellfish Allergy Itching, Swelling        Medication List     TAKE these medications    ibuprofen 600 MG tablet Commonly known as: ADVIL Take 1 tablet (600 mg total) by mouth every 6 (six) hours as needed for fever, headache, moderate pain or cramping.   medroxyPROGESTERone 150 MG/ML injection Commonly known as: DEPO-PROVERA Inject 150 mg into the muscle every 3 (three) months.   nicotine 21 mg/24hr patch Commonly known as: NICODERM CQ - dosed in mg/24 hours Place 1 patch (21 mg total) onto the skin daily.   traMADol 50 MG tablet Commonly known as: ULTRAM Take 1 tablet (50 mg total) by mouth every 6 (six) hours as needed (breakthrough pain).   VITAMIN C PO Take 1 tablet by mouth daily.        Follow-up Information     [facility] [place] Follow up.   Specialty: General Surgery Why: Please call to confirm your appointment date and time. We are working hard to make this for you. Please arrive 30 minutes prior to your appointment for paperwork. Please bring a copy of your photo ID and insurance card. Contact information: [address] [place]  [address] [phone]                Allergies  Allergen Reactions   Latex Itching and Swelling  Shellfish Allergy Itching and Swelling    Consultations: General surgery  Procedures/Studies: MR ABDOMEN MRCP W WO CONTAST  Result Date: 03/09/2021 CLINICAL DATA:  Jaundice. Abdominal pain. Elevated liver function tests. EXAM: MRI ABDOMEN WITHOUT AND WITH CONTRAST (INCLUDING MRCP)  TECHNIQUE: Multiplanar multisequence MR imaging of the abdomen was performed both before and after the administration of intravenous contrast. Heavily T2-weighted images of the biliary and pancreatic ducts were obtained, and three-dimensional MRCP images were rendered by post processing. CONTRAST:  9.78mL GADAVIST GADOBUTROL 1 MMOL/ML IV SOLN COMPARISON:  Ultrasound on 03/09/2021 FINDINGS: Lower chest: No acute findings. Hepatobiliary: No hepatic masses identified. No evidence of steatosis on chemical shift imaging. The gallbladder is incompletely distended. A few tiny gallstones are seen. Mild diffuse gallbladder wall thickening is also demonstrated, without adjacent pericholecystic inflammatory changes. No evidence of biliary ductal dilatation or choledocholithiasis. Pancreas:  No mass or inflammatory changes. Spleen:  Within normal limits in size and appearance. Adrenals/Urinary Tract: No masses identified. No evidence of hydronephrosis. Stomach/Bowel: Unremarkable. Vascular/Lymphatic: No pathologically enlarged lymph nodes identified. No acute vascular findings. Other:  None. Musculoskeletal:  No suspicious bone lesions identified. IMPRESSION: Cholelithiasis. Mild diffuse gallbladder wall thickening, without dilatation or pericholecystic inflammatory changes. This finding is nonspecific. Chronic cholecystitis cannot be excluded. No evidence of biliary ductal dilatation or choledocholithiasis. No evidence of hepatic mass or steatosis. Electronically Signed   By: Marlaine Hind M.D.   On: 03/09/2021 13:30   US Abdomen Limited RUQ (LIVER/GB)  Result Date: 03/09/2021 CLINICAL DATA:  Elevated liver enzymes. EXAM: ULTRASOUND ABDOMEN LIMITED RIGHT UPPER QUADRANT COMPARISON:  None. FINDINGS: Gallbladder: There are small layering stones in the gallbladder. No pericholecystic fluid is seen with the free wall show mild thickening up to 3.8 mm. There is no positive sonographic Murphy's sign. Largest calculus is 7 mm.  Common bile duct: Diameter: 3.9 mm.  No intrahepatic biliary prominence. Liver: No focal lesion identified. There is mild increased hepatic echogenicity consistent with steatosis. Portal vein is patent on color Doppler imaging with normal direction of blood flow towards the liver. Other: None. IMPRESSION: 1. Mildly thickened gallbladder with stones but no adjacent fluid or positive sonographic Murphy's sign. 2. Differential diagnosis is chronic cholecystitis, pain-medicated acute cholecystitis, and wall thickening related to hepatic dysfunction and/or passive congestion, or reactive from adjacent inflammatory process. 3. Mild increased liver echogenicity of steatosis. Electronically Signed   By: Telford Nab M.D.   On: 03/09/2021 01:07     Subjective: No acute issues or events overnight tolerated procedure well   Discharge Exam: Vitals:   03/10/21 1843 03/10/21 2033  BP: 135/76 132/75  Pulse: 93 90  Resp: 18 18  Temp:  98.2 F (36.8 C)  SpO2: 97% 100%   Vitals:   03/10/21 1220 03/10/21 1239 03/10/21 1843 03/10/21 2033  BP: 125/73 116/74 135/76 132/75  Pulse: 79 62 93 90  Resp: $Remo'14 16 18 18  'gKqmM$ Temp: (!) 97 F (36.1 C) 97.8 F (36.6 C)  98.2 F (36.8 C)  TempSrc:  Oral  Oral  SpO2: 100% 99% 97% 100%  Weight:      Height:        General: Pt is alert, awake, not in acute distress Cardiovascular: RRR, S1/S2 +, no rubs, no gallops Respiratory: CTA bilaterally, no wheezing, no rhonchi Abdominal: Soft, minimally tender, bandages clean dry intact Extremities: no edema, no cyanosis    The results of significant diagnostics from this hospitalization (including imaging, microbiology, ancillary and laboratory) are listed below for reference.  Microbiology: Recent Results (from the past 240 hour(s))  Resp Panel by RT-PCR (Flu A&B, Covid) Nasopharyngeal Swab     Status: None   Collection Time: 03/09/21  2:36 AM   Specimen: Nasopharyngeal Swab; Nasopharyngeal(NP) swabs in vial  transport medium  Result Value Ref Range Status   SARS Coronavirus 2 by RT PCR NEGATIVE NEGATIVE Final    Comment: (NOTE) SARS-CoV-2 target nucleic acids are NOT DETECTED.  The SARS-CoV-2 RNA is generally detectable in upper respiratory specimens during the acute phase of infection. The lowest concentration of SARS-CoV-2 viral copies this assay can detect is 138 copies/mL. A negative result does not preclude SARS-Cov-2 infection and should not be used as the sole basis for treatment or other patient management decisions. A negative result may occur with  improper specimen collection/handling, submission of specimen other than nasopharyngeal swab, presence of viral mutation(s) within the areas targeted by this assay, and inadequate number of viral copies(<138 copies/mL). A negative result must be combined with clinical observations, patient history, and epidemiological information. The expected result is Negative.  Fact Sheet for Patients:  EntrepreneurPulse.com.au  Fact Sheet for Healthcare Providers:  IncredibleEmployment.be  This test is no t yet approved or cleared by the [place] FDA and  has been authorized for detection and/or diagnosis of SARS-CoV-2 by FDA under an Emergency Use Authorization (EUA). This EUA will remain  in effect (meaning this test can be used) for the duration of the COVID-19 declaration under Section 564(b)(1) of the Act, 21 U.S.C.section 360bbb-3(b)(1), unless the authorization is terminated  or revoked sooner.       Influenza A by PCR NEGATIVE NEGATIVE Final   Influenza B by PCR NEGATIVE NEGATIVE Final    Comment: (NOTE) The Xpert Xpress SARS-CoV-2/FLU/RSV plus assay is intended as an aid in the diagnosis of influenza from Nasopharyngeal swab specimens and should not be used as a sole basis for treatment. Nasal washings and aspirates are unacceptable for Xpert Xpress SARS-CoV-2/FLU/RSV testing.  Fact  Sheet for Patients: EntrepreneurPulse.com.au  Fact Sheet for Healthcare Providers: IncredibleEmployment.be  This test is not yet approved or cleared by the [place] FDA and has been authorized for detection and/or diagnosis of SARS-CoV-2 by FDA under an Emergency Use Authorization (EUA). This EUA will remain in effect (meaning this test can be used) for the duration of the COVID-19 declaration under Section 564(b)(1) of the Act, 21 U.S.C. section 360bbb-3(b)(1), unless the authorization is terminated or revoked.  Performed at [facility], [place] [address] [place], [place] [address]      Labs: BNP (last 3 results) No results for input(s): BNP in the last 8760 hours. Basic Metabolic Panel: Recent Labs  Lab 03/08/21 2044 03/09/21 0457 03/10/21 0633 03/11/21 0425  NA 140 141 141 140  K 4.3 4.0 4.4 4.3  CL 105 109 112* 110  CO2 $Re'27 23 23 23  'sLn$ GLUCOSE 109* 94 111* 152*  BUN $Re'11 10 10 10  'Vid$ CREATININE 1.14* 1.04* 1.16* 0.97  CALCIUM 9.5 8.9 8.7* 8.9   Liver Function Tests: Recent Labs  Lab 03/08/21 2044 03/09/21 0457 03/10/21 0633 03/11/21 0425  AST 963* 540* 158* 80*  ALT 902* 730* 499* 356*  ALKPHOS 110 111 120 96  BILITOT 3.2* 2.9* 0.7 0.4  PROT 7.4 6.4* 6.2* 6.4*  ALBUMIN 3.8 3.3* 3.0* 3.1*   Recent Labs  Lab 03/08/21 2044  LIPASE 27   No results for input(s): AMMONIA in the last 168 hours. CBC: Recent Labs  Lab 03/08/21 2044  03/09/21 0457 03/10/21 0633  WBC 4.0 3.6* 4.7  NEUTROABS  --   --  1.9  HGB 13.0 11.7* 11.7*  HCT 40.5 35.8* 35.5*  MCV 91.4 90.9 91.3  PLT 278 207 218   Cardiac Enzymes: No results for input(s): CKTOTAL, CKMB, CKMBINDEX, TROPONINI in the last 168 hours. BNP: Invalid input(s): POCBNP CBG: No results for input(s): GLUCAP in the last 168 hours. D-Dimer No results for input(s): DDIMER in the last 72 hours. Hgb A1c No results for input(s): HGBA1C in the last 72 hours. Lipid  Profile No results for input(s): CHOL, HDL, LDLCALC, TRIG, CHOLHDL, LDLDIRECT in the last 72 hours. Thyroid function studies No results for input(s): TSH, T4TOTAL, T3FREE, THYROIDAB in the last 72 hours.  Invalid input(s): FREET3 Anemia work up No results for input(s): VITAMINB12, FOLATE, FERRITIN, TIBC, IRON, RETICCTPCT in the last 72 hours. Urinalysis    Component Value Date/Time   COLORURINE AMBER (A) 03/08/2021 2044   APPEARANCEUR CLEAR 03/08/2021 2044   LABSPEC 1.040 (H) 03/08/2021 2044   PHURINE 6.0 03/08/2021 2044   GLUCOSEU NEGATIVE 03/08/2021 2044   HGBUR NEGATIVE 03/08/2021 2044   BILIRUBINUR MODERATE (A) 03/08/2021 2044   KETONESUR NEGATIVE 03/08/2021 2044   PROTEINUR 100 (A) 03/08/2021 2044   UROBILINOGEN 0.2 12/06/2019 1055   NITRITE NEGATIVE 03/08/2021 2044   LEUKOCYTESUR NEGATIVE 03/08/2021 2044   Sepsis Labs Invalid input(s): PROCALCITONIN,  WBC,  LACTICIDVEN Microbiology Recent Results (from the past 240 hour(s))  Resp Panel by RT-PCR (Flu A&B, Covid) Nasopharyngeal Swab     Status: None   Collection Time: 03/09/21  2:36 AM   Specimen: Nasopharyngeal Swab; Nasopharyngeal(NP) swabs in vial transport medium  Result Value Ref Range Status   SARS Coronavirus 2 by RT PCR NEGATIVE NEGATIVE Final    Comment: (NOTE) SARS-CoV-2 target nucleic acids are NOT DETECTED.  The SARS-CoV-2 RNA is generally detectable in upper respiratory specimens during the acute phase of infection. The lowest concentration of SARS-CoV-2 viral copies this assay can detect is 138 copies/mL. A negative result does not preclude SARS-Cov-2 infection and should not be used as the sole basis for treatment or other patient management decisions. A negative result may occur with  improper specimen collection/handling, submission of specimen other than nasopharyngeal swab, presence of viral mutation(s) within the areas targeted by this assay, and inadequate number of viral copies(<138 copies/mL).  A negative result must be combined with clinical observations, patient history, and epidemiological information. The expected result is Negative.  Fact Sheet for Patients:  EntrepreneurPulse.com.au  Fact Sheet for Healthcare Providers:  IncredibleEmployment.be  This test is no t yet approved or cleared by the [place] FDA and  has been authorized for detection and/or diagnosis of SARS-CoV-2 by FDA under an Emergency Use Authorization (EUA). This EUA will remain  in effect (meaning this test can be used) for the duration of the COVID-19 declaration under Section 564(b)(1) of the Act, 21 U.S.C.section 360bbb-3(b)(1), unless the authorization is terminated  or revoked sooner.       Influenza A by PCR NEGATIVE NEGATIVE Final   Influenza B by PCR NEGATIVE NEGATIVE Final    Comment: (NOTE) The Xpert Xpress SARS-CoV-2/FLU/RSV plus assay is intended as an aid in the diagnosis of influenza from Nasopharyngeal swab specimens and should not be used as a sole basis for treatment. Nasal washings and aspirates are unacceptable for Xpert Xpress SARS-CoV-2/FLU/RSV testing.  Fact Sheet for Patients: EntrepreneurPulse.com.au  Fact Sheet for Healthcare Providers: IncredibleEmployment.be  This test is not yet  approved or cleared by the [place] and has been authorized for detection and/or diagnosis of SARS-CoV-2 by FDA under an Emergency Use Authorization (EUA). This EUA will remain in effect (meaning this test can be used) for the duration of the COVID-19 declaration under Section 564(b)(1) of the Act, 21 U.S.C. section 360bbb-3(b)(1), unless the authorization is terminated or revoked.  Performed at [facility], [place] [address], [place], [place] [address]      Time coordinating discharge: Over 30 minutes  SIGNED:   Little Ishikawa, DO [facility] 03/11/2021, 7:35 AM Pager   If  7PM-7AM, please contact night-coverage [email or web address]

## 2021-03-11 NOTE — TOC Transition Note (Signed)
Transition of Care [facility]) - CM/SW Discharge Note   Patient Details  Name: Brooke Howell MRN: [id number] Date of Birth: 03-07-1990  Transition of [facility]) CM/SW Contact:  Tom-Johnson, [name], RN Phone Number: 03/11/2021, 10:11 AM   Clinical Narrative:     Patient is from home and admitted for acute Hepatitis and Gallstones. Had Laparoscopic Cholecystectomy yesterday. Scheduled for discharge today. Hospital f/u scheduled with [facility] and information on AVS. Denies any needs. Family to transport at discharge. No further TOC needs noted.   Final next level of care: Home/Self Care Barriers to Discharge: Barriers Resolved   Patient Goals and CMS Choice Patient states their goals for this hospitalization and ongoing recovery are:: To return home CMS Medicare.gov Compare Post Acute Care list provided to:: Patient Choice offered to / list presented to : NA  Discharge Placement                       Discharge Plan and Services                DME Arranged: N/A DME Agency: NA       HH Arranged: NA HH Agency: NA        Social Determinants of Health (SDOH) Interventions     Readmission Risk Interventions No flowsheet data found.

## 2021-03-11 NOTE — Progress Notes (Signed)
DISCHARGE NOTE HOME Brooke Howell to be discharged Home per MD order. Discussed prescriptions and follow up appointments with the patient. Prescriptions given to patient; medication list explained in detail. Patient verbalized understanding.  Skin clean, dry and intact without evidence of skin break down, no evidence of skin tears noted. IV catheter discontinued intact. Site without signs and symptoms of complications. Dressing and pressure applied. Pt denies pain at the site currently. No complaints noted.  Patient free of lines, drains, and wounds.   An After Visit Summary (AVS) was printed and given to the patient. Patient escorted via wheelchair, and discharged home via private auto.  Lorine Bears, RN

## 2021-04-08 ENCOUNTER — Ambulatory Visit (INDEPENDENT_AMBULATORY_CARE_PROVIDER_SITE_OTHER): Payer: Medicaid Other | Admitting: *Deleted

## 2021-04-08 ENCOUNTER — Other Ambulatory Visit: Payer: Self-pay

## 2021-04-08 ENCOUNTER — Ambulatory Visit (INDEPENDENT_AMBULATORY_CARE_PROVIDER_SITE_OTHER): Payer: Medicaid Other | Admitting: Radiology

## 2021-04-08 VITALS — BP 130/78

## 2021-04-08 DIAGNOSIS — N760 Acute vaginitis: Secondary | ICD-10-CM

## 2021-04-08 DIAGNOSIS — Z3042 Encounter for surveillance of injectable contraceptive: Secondary | ICD-10-CM | POA: Diagnosis not present

## 2021-04-08 LAB — WET PREP FOR TRICH, YEAST, CLUE

## 2021-04-08 MED ORDER — MEDROXYPROGESTERONE ACETATE 150 MG/ML IM SUSP
150.0000 mg | Freq: Once | INTRAMUSCULAR | Status: DC
  Administered 2021-04-08: 150 mg via INTRAMUSCULAR

## 2021-04-08 MED ORDER — FLUCONAZOLE 150 MG PO TABS: 150.0000 mg | ORAL_TABLET | Freq: Once | ORAL | 0 refills | Status: AC

## 2021-04-08 MED ORDER — TINIDAZOLE 500 MG PO TABS: 2.0000 g | ORAL_TABLET | Freq: Every day | ORAL | 0 refills | Status: DC

## 2021-04-08 NOTE — Progress Notes (Signed)
? ? ? ? ?  Subjective: Leana Springston is a 31 y.o. female who complains of vaginal discharge. Had intercourse with ex recently. Denies any urinary symptoms, itch or odor. ? ? ?- Sexually active with 1 female partner(s) ?- Last sexual encounter: 1 week ?-Contraception: depo ? ?Review of Systems See HPI, otherwise negative ? ?Objective: ? ?-Vulva: without lesions or discharge ?-Vagina: discharge present, aptima swab and wet prep obtained ?-Cervix: no lesion or discharge, no CMT ?-Perineum: no lesions ?-Uterus: Mobile, non tender ?-Adnexa: no masses or tenderness ? ? ?Microscopic wet-mount exam shows negative for pathogens, normal epithelial cells.  ? ?Chaperone offered and declined. ? ?Assessment:/Plan:  ? ?1. Acute vaginitis ?Despite neg wet mount will treat for yeast and BV presumptively. ?RX's sent to pharmacy ? ?- WET PREP FOR TRICH, YEAST, CLUE ?  ? ?Will contact patient with results of testing completed today. Avoid intercourse until symptoms are resolved. Safe sex encouraged. Avoid the use of soaps or perfumed products in the peri area. Avoid tub baths and sitting in sweaty or wet clothing for prolonged periods of time.   ?

## 2021-04-11 ENCOUNTER — Ambulatory Visit (HOSPITAL_BASED_OUTPATIENT_CLINIC_OR_DEPARTMENT_OTHER): Payer: Medicaid Other | Admitting: Nurse Practitioner

## 2021-04-11 ENCOUNTER — Encounter: Payer: Self-pay | Admitting: Nurse Practitioner

## 2021-04-11 ENCOUNTER — Ambulatory Visit: Payer: Medicaid Other | Admitting: Nurse Practitioner

## 2021-04-11 VITALS — BP 132/75 | HR 86

## 2021-04-11 DIAGNOSIS — D649 Anemia, unspecified: Secondary | ICD-10-CM | POA: Diagnosis not present

## 2021-04-11 DIAGNOSIS — R7989 Other specified abnormal findings of blood chemistry: Secondary | ICD-10-CM

## 2021-04-11 DIAGNOSIS — R7309 Other abnormal glucose: Secondary | ICD-10-CM | POA: Diagnosis not present

## 2021-04-11 DIAGNOSIS — Z7689 Persons encountering health services in other specified circumstances: Secondary | ICD-10-CM
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
## 2021-04-12 LAB — CBC WITH DIFFERENTIAL/PLATELET
Basophils Absolute: 0 10*3/uL (ref 0.0–0.2)
Basos: 1 %
EOS (ABSOLUTE): 0.3 10*3/uL (ref 0.0–0.4)
Eos: 8 %
Hematocrit: 36.6 % (ref 34.0–46.6)
Hemoglobin: 12.3 g/dL (ref 11.1–15.9)
Immature Grans (Abs): 0 10*3/uL (ref 0.0–0.1)
Immature Granulocytes: 0 %
Lymphocytes Absolute: 2.4 10*3/uL (ref 0.7–3.1)
Lymphs: 56 %
MCH: 29.6 pg (ref 26.6–33.0)
MCHC: 33.6 g/dL (ref 31.5–35.7)
MCV: 88 fL (ref 79–97)
Monocytes Absolute: 0.2 10*3/uL (ref 0.1–0.9)
Monocytes: 5 %
Neutrophils Absolute: 1.3 10*3/uL — ABNORMAL LOW (ref 1.4–7.0)
Neutrophils: 30 %
Platelets: 257 10*3/uL (ref 150–450)
RBC: 4.15 x10E6/uL (ref 3.77–5.28)
RDW: 12.5 % (ref 11.7–15.4)
WBC: 4.3 10*3/uL (ref 3.4–10.8)

## 2021-04-12 LAB — CMP14+EGFR
ALT: 13 IU/L (ref 0–32)
AST: 17 IU/L (ref 0–40)
Albumin/Globulin Ratio: 1.5 (ref 1.2–2.2)
Albumin: 4.4 g/dL (ref 3.8–4.8)
Alkaline Phosphatase: 70 IU/L (ref 44–121)
BUN/Creatinine Ratio: 12 (ref 9–23)
BUN: 12 mg/dL (ref 6–20)
Bilirubin Total: 0.5 mg/dL (ref 0.0–1.2)
CO2: 20 mmol/L (ref 20–29)
Calcium: 9.6 mg/dL (ref 8.7–10.2)
Chloride: 107 mmol/L — ABNORMAL HIGH (ref 96–106)
Creatinine, Ser: 1.03 mg/dL — ABNORMAL HIGH (ref 0.57–1.00)
Globulin, Total: 3 g/dL (ref 1.5–4.5)
Glucose: 84 mg/dL (ref 70–99)
Potassium: 4 mmol/L (ref 3.5–5.2)
Sodium: 140 mmol/L (ref 134–144)
Total Protein: 7.4 g/dL (ref 6.0–8.5)
eGFR: 74 mL/min/{1.73_m2} (ref >59)

## 2021-04-12 LAB — HEMOGLOBIN A1C
Est. average glucose Bld gHb Est-mCnc: 120 mg/dL
Hgb A1c MFr Bld: 5.8 % — ABNORMAL HIGH (ref 4.8–5.6)

## 2021-04-14 ENCOUNTER — Encounter: Payer: Self-pay | Admitting: Nurse Practitioner

## 2021-06-24 ENCOUNTER — Ambulatory Visit (INDEPENDENT_AMBULATORY_CARE_PROVIDER_SITE_OTHER): Payer: 59 | Admitting: Radiology

## 2021-06-24 VITALS — BP 118/74

## 2021-06-24 DIAGNOSIS — R350 Frequency of micturition: Secondary | ICD-10-CM | POA: Diagnosis not present

## 2021-06-24 DIAGNOSIS — N898 Other specified noninflammatory disorders of vagina: Secondary | ICD-10-CM | POA: Diagnosis not present

## 2021-06-24 LAB — WET PREP FOR TRICH, YEAST, CLUE

## 2021-06-24 MED ORDER — FLUCONAZOLE 150 MG PO TABS: 150.0000 mg | ORAL_TABLET | ORAL | 0 refills | Status: DC

## 2021-06-24 NOTE — Progress Notes (Signed)
      Subjective: Brooke Howell is a 31 y.o. female who complains of vaginal discharge x 1 week, no itching or odor. Occasional burning with urination. Not sexually active. Treated for BV and yeast presumptively in March, symptoms resolved but returned last week. No other concerns.    Review of Systems  All other systems reviewed and are negative.   Objective:  -Vulva: without lesions or discharge -Vagina: discharge present,wet prep obtained -Cervix: no lesion or discharge, no CMT -Perineum: no lesions -Uterus: Mobile, non tender -Adnexa: no masses or tenderness   Microscopic wet-mount exam shows hyphae. Urine dipstick shows negative for all components.  Micro exam: 0-5 WBC's per HPF and many+ bacteria.    Chaperone offered and declined.  Assessment:/Plan:   1. Urinary frequency  - WET PREP FOR TRICH, YEAST, CLUE  2. Vaginal discharge Diflucan 150mg  po #2 take one today and 1 in 3 days - Urinalysis,Complete w/RFL Culture    Will contact patient with results of testing completed today. Avoid intercourse until symptoms are resolved. Safe sex encouraged. Avoid the use of soaps or perfumed products in the peri area. Avoid tub baths and sitting in sweaty or wet clothing for prolonged periods of time.

## 2021-06-26 LAB — URINALYSIS, COMPLETE W/RFL CULTURE
Bilirubin Urine: NEGATIVE
Glucose, UA: NEGATIVE
Hgb urine dipstick: NEGATIVE
Hyaline Cast: NONE SEEN /LPF
Ketones, ur: NEGATIVE
Leukocyte Esterase: NEGATIVE
Nitrites, Initial: NEGATIVE
Protein, ur: NEGATIVE
RBC / HPF: NONE SEEN /HPF (ref 0–2)
Specific Gravity, Urine: 1.024 (ref 1.001–1.035)
pH: 5.5 (ref 5.0–8.0)

## 2021-06-26 LAB — URINE CULTURE
MICRO NUMBER:: 13518660
SPECIMEN QUALITY:: ADEQUATE

## 2021-06-26 LAB — CULTURE INDICATED

## 2021-07-18 ENCOUNTER — Ambulatory Visit (INDEPENDENT_AMBULATORY_CARE_PROVIDER_SITE_OTHER): Payer: 59 | Admitting: *Deleted

## 2021-07-18 DIAGNOSIS — Z3042 Encounter for surveillance of injectable contraceptive: Secondary | ICD-10-CM

## 2021-07-18 MED ORDER — MEDROXYPROGESTERONE ACETATE 150 MG/ML IM SUSP
150.0000 mg | Freq: Once | INTRAMUSCULAR | Status: AC
  Administered 2021-07-18: 150 mg via INTRAMUSCULAR

## 2021-07-18 NOTE — Progress Notes (Signed)
Pt came in for Depo. Pt aware she is late per calendar. Per provider collect UPT. Patient refused stating she has not had intercourse since March. Per Genia Del MD to ok give Depo due to high risk. Patient signed form stating she is aware of recommendations, and still wants to proceed with injection .   Next injection Sep 22-October 6th

## 2021-09-23 ENCOUNTER — Other Ambulatory Visit: Payer: Self-pay | Admitting: Radiology

## 2021-09-23 ENCOUNTER — Other Ambulatory Visit: Payer: Self-pay | Admitting: *Deleted

## 2021-09-23 ENCOUNTER — Encounter: Payer: Self-pay | Admitting: Radiology

## 2021-09-23 ENCOUNTER — Ambulatory Visit (INDEPENDENT_AMBULATORY_CARE_PROVIDER_SITE_OTHER): Payer: Medicaid Other | Admitting: Radiology

## 2021-09-23 VITALS — BP 160/90 | HR 86 | Ht 67.0 in | Wt 227.0 lb

## 2021-09-23 DIAGNOSIS — B9689 Other specified bacterial agents as the cause of diseases classified elsewhere: Secondary | ICD-10-CM

## 2021-09-23 DIAGNOSIS — N760 Acute vaginitis: Secondary | ICD-10-CM | POA: Diagnosis not present

## 2021-09-23 LAB — WET PREP FOR TRICH, YEAST, CLUE

## 2021-09-23 MED ORDER — METRONIDAZOLE 0.75 % VA GEL: 1.0000 | Freq: Every day | VAGINAL | 0 refills | Status: DC

## 2021-09-23 MED ORDER — FLUCONAZOLE 150 MG PO TABS: 150.0000 mg | ORAL_TABLET | Freq: Once | ORAL | 0 refills | Status: AC
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
## 2021-10-16 ENCOUNTER — Other Ambulatory Visit (HOSPITAL_COMMUNITY)
Admission: RE | Admit: 2021-10-16 | Discharge: 2021-10-16 | Disposition: A | Discharge status: Home / Self Care | Payer: Medicaid Other | Source: Ambulatory Visit | Attending: Nurse Practitioner | Admitting: Nurse Practitioner

## 2021-10-16 ENCOUNTER — Encounter: Payer: Self-pay | Admitting: Nurse Practitioner

## 2021-10-16 ENCOUNTER — Ambulatory Visit (INDEPENDENT_AMBULATORY_CARE_PROVIDER_SITE_OTHER): Payer: Medicaid Other | Admitting: Nurse Practitioner

## 2021-10-16 VITALS — BP 122/68 | HR 90 | Resp 14 | Ht 66.75 in | Wt 221.0 lb

## 2021-10-16 DIAGNOSIS — Z01419 Encounter for gynecological examination (general) (routine) without abnormal findings: Secondary | ICD-10-CM | POA: Insufficient documentation

## 2021-10-16 DIAGNOSIS — Z3042 Encounter for surveillance of injectable contraceptive: Secondary | ICD-10-CM

## 2021-10-16 MED ORDER — MEDROXYPROGESTERONE ACETATE 150 MG/ML IM SUSP
150.0000 mg | Freq: Once | INTRAMUSCULAR | Status: AC
  Administered 2021-10-16: 150 mg via INTRAMUSCULAR

## 2021-10-16 NOTE — Progress Notes (Signed)
   Brooke Howell 29-Dec-1990 [id number]   History:  31 y.o. [id number] presents for annual exam. No GYN complaints. Amenorrheic on Depo Provera. 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 History of syphilis, chlamydia. History of recurrent BV, most recent infection last month.   Gynecologic History No LMP recorded. Patient has had an injection.   Contraception/Family planning: Depo-Provera injections Sexually active: No  Health Maintenance Last Pap: 06/14/2018. Results were: Normal neg HPV Last mammogram: 2017 (left only). Results were: benign biopsy Last colonoscopy: Not indicated Last Dexa: Not indicated  Past medical history, past surgical history, family history and social history were all reviewed and documented in the EPIC chart. 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   ROS:  A ROS was performed and pertinent positives and negatives are included.  Exam:  Vitals:   10/16/21 1402  BP: 122/68  Pulse: 90  Resp: 14  Weight: 221 lb (100.2 kg)  Height: 5' 6.75" (1.695 m)    Body mass index is 34.87 kg/m.  General appearance:  Normal Thyroid:  Symmetrical, normal in size, without palpable masses or nodularity. Respiratory  Auscultation:  Clear without wheezing or rhonchi Cardiovascular  Auscultation:  Regular rate, without rubs, murmurs or gallops  Edema/varicosities:  Not grossly evident Abdominal  Soft,nontender, without masses, guarding or rebound.  Liver/spleen:  No organomegaly noted  Hernia:  None appreciated  Skin  Inspection:  Grossly normal Breasts: Examined lying and sitting.   Right: Without masses, retractions, nipple discharge or axillary adenopathy.   Left: Without masses, retractions, nipple discharge or axillary adenopathy. Genitourinary   Inguinal/mons:  Normal without inguinal adenopathy  External genitalia:  Normal appearing vulva with no masses, tenderness, or  lesions  BUS/Urethra/Skene's glands:  Normal  Vagina:  Normal appearing with normal color and discharge, no lesions  Cervix:  Normal appearing without discharge or lesions  Uterus:  Normal in size, shape and contour.  Midline and mobile, nontender  Adnexa/parametria:     Rt: Normal in size, without masses or tenderness.   Lt: Normal in size, without masses or tenderness.  Anus and perineum: Normal  Patient informed chaperone available to be present for breast and pelvic exam. Patient has requested no chaperone to be present. Patient has been advised what will be completed during breast and pelvic exam.   Assessment/Plan:  31 y.o. [id number] for annual exam.   Well female exam with routine gynecological exam - Plan: Cytology - PAP( [place]). Education provided on SBEs, importance of preventative screenings, current guidelines, high calcium diet, regular exercise, and multivitamin daily. Labs with PCP.   Encounter for surveillance of injectable contraceptive - Plan: medroxyPROGESTERone (DEPO-PROVERA) injection 150 mg every 3 months. Amenorrheic.   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 Pap today.   Return in 1 year for annual.     Tamela Gammon DNP, 2:54 PM 10/16/2021

## 2021-10-16 NOTE — Progress Notes (Signed)
Patient is here for Depo Provera Injection Patient is within Depo Provera Calender Limits yes, last given 07/18/21 Next Depo Due between: 12/21 to 1/4 Last AEX: 10-16-21 TW AEX Scheduled: not scheduled   Patient is aware when next depo is due  Pt tolerated Injection well in [place].   Routed to provider for review, encounter closed.

## 2021-10-21 LAB — CYTOLOGY - PAP
Comment: NEGATIVE
Diagnosis: NEGATIVE
High risk HPV: NEGATIVE

## 2022-01-08 ENCOUNTER — Ambulatory Visit (INDEPENDENT_AMBULATORY_CARE_PROVIDER_SITE_OTHER): Payer: Medicaid Other | Admitting: *Deleted

## 2022-01-08 DIAGNOSIS — Z3042 Encounter for surveillance of injectable contraceptive: Secondary | ICD-10-CM | POA: Diagnosis not present

## 2022-01-08 MED ORDER — MEDROXYPROGESTERONE ACETATE 150 MG/ML IM SUSP
150.0000 mg | Freq: Once | INTRAMUSCULAR | Status: AC
  Administered 2022-01-08: 150 mg via INTRAMUSCULAR

## 2022-02-12 ENCOUNTER — Telehealth: Payer: Self-pay

## 2022-02-12 NOTE — Telephone Encounter (Signed)
Pt aware of advice 

## 2022-02-12 NOTE — Telephone Encounter (Signed)
Patient reports last week experienced a little vaginal discharge in panties.  Now has odor off and on when she goes to restroom.  When she showers she can smell the odor.   No sexual intercourse for > one year.  She called for a visit but cannot get appointment with any provider until 02/25/22.  She has Metrogel Vaginal previously prescribed by Wende Crease, NP.  She has whole tube available to her at home.  She is asking if provider thinks okay for her to go ahead and treat with this without coming in and maybe if persists she will schedule visit. She said BV is a common thing for her to have.

## 2022-02-12 NOTE — Telephone Encounter (Signed)
Yes, OK to use Metrogel she has at home.

## 2022-04-07 ENCOUNTER — Ambulatory Visit (INDEPENDENT_AMBULATORY_CARE_PROVIDER_SITE_OTHER): Payer: Self-pay | Admitting: *Deleted

## 2022-04-07 DIAGNOSIS — Z3042 Encounter for surveillance of injectable contraceptive: Secondary | ICD-10-CM

## 2022-04-07 MED ORDER — MEDROXYPROGESTERONE ACETATE 150 MG/ML IM SUSP
150.0000 mg | Freq: Once | INTRAMUSCULAR | Status: AC
  Administered 2022-04-07: 150 mg via INTRAMUSCULAR

## 2022-07-14 ENCOUNTER — Ambulatory Visit (INDEPENDENT_AMBULATORY_CARE_PROVIDER_SITE_OTHER): Payer: 59

## 2022-07-14 DIAGNOSIS — Z3042 Encounter for surveillance of injectable contraceptive: Secondary | ICD-10-CM

## 2022-07-14 MED ORDER — MEDROXYPROGESTERONE ACETATE 150 MG/ML IM SUSY
150.0000 mg | PREFILLED_SYRINGE | Freq: Once | INTRAMUSCULAR | Status: AC
Start: 2022-07-14 — End: 2022-07-14
  Administered 2022-07-14: 150 mg via INTRAMUSCULAR

## 2023-02-19 ENCOUNTER — Telehealth: Payer: Self-pay | Admitting: Nurse Practitioner

## 2023-02-19 NOTE — Telephone Encounter (Signed)
 Contacted pt to confirm appt

## 2023-02-24 ENCOUNTER — Ambulatory Visit: Payer: 59 | Attending: Nurse Practitioner | Admitting: Nurse Practitioner

## 2023-02-24 VITALS — Ht 67.0 in | Wt 234.6 lb

## 2023-02-24 DIAGNOSIS — M2550 Pain in unspecified joint: Secondary | ICD-10-CM | POA: Diagnosis not present

## 2023-02-24 DIAGNOSIS — R7303 Prediabetes: Secondary | ICD-10-CM

## 2023-02-24 DIAGNOSIS — M779 Enthesopathy, unspecified: Secondary | ICD-10-CM

## 2023-02-24 DIAGNOSIS — D649 Anemia, unspecified: Secondary | ICD-10-CM | POA: Diagnosis not present

## 2023-02-24 DIAGNOSIS — E785 Hyperlipidemia, unspecified: Secondary | ICD-10-CM

## 2023-02-24 DIAGNOSIS — G44221 Chronic tension-type headache, intractable: Secondary | ICD-10-CM | POA: Diagnosis not present

## 2023-02-24 MED ORDER — MELOXICAM 7.5 MG PO TABS
7.5000 mg | ORAL_TABLET | Freq: Every day | ORAL | 1 refills | Status: DC
Start: 2023-02-24 — End: 2023-03-25

## 2023-02-24 MED ORDER — SUMATRIPTAN SUCCINATE 50 MG PO TABS
50.0000 mg | ORAL_TABLET | Freq: Once | ORAL | 1 refills | Status: DC
Start: 2023-02-24 — End: 2023-02-26
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
## 2023-02-25 LAB — CBC WITH DIFFERENTIAL/PLATELET
Basophils Absolute: 0 10*3/uL (ref 0.0–0.2)
Basos: 0 %
EOS (ABSOLUTE): 0.1 10*3/uL (ref 0.0–0.4)
Eos: 2 %
Hematocrit: 40.6 % (ref 34.0–46.6)
Hemoglobin: 13 g/dL (ref 11.1–15.9)
Immature Grans (Abs): 0 10*3/uL (ref 0.0–0.1)
Immature Granulocytes: 0 %
Lymphocytes Absolute: 2.6 10*3/uL (ref 0.7–3.1)
Lymphs: 43 %
MCH: 29.1 pg (ref 26.6–33.0)
MCHC: 32 g/dL (ref 31.5–35.7)
MCV: 91 fL (ref 79–97)
Monocytes Absolute: 0.3 10*3/uL (ref 0.1–0.9)
Monocytes: 5 %
Neutrophils Absolute: 3 10*3/uL (ref 1.4–7.0)
Neutrophils: 50 %
Platelets: 300 10*3/uL (ref 150–450)
RBC: 4.46 x10E6/uL (ref 3.77–5.28)
RDW: 12.4 % (ref 11.7–15.4)
WBC: 6 10*3/uL (ref 3.4–10.8)

## 2023-02-25 LAB — LIPID PANEL
Chol/HDL Ratio: 3.3 {ratio} (ref 0.0–4.4)
Cholesterol, Total: 251 mg/dL — ABNORMAL HIGH (ref 100–199)
HDL: 75 mg/dL (ref >39)
LDL Chol Calc (NIH): 158 mg/dL — ABNORMAL HIGH (ref 0–99)
Triglycerides: 105 mg/dL (ref 0–149)
VLDL Cholesterol Cal: 18 mg/dL (ref 5–40)

## 2023-02-25 LAB — CMP14+EGFR
ALT: 14 [IU]/L (ref 0–32)
AST: 21 [IU]/L (ref 0–40)
Albumin: 4.4 g/dL (ref 3.9–4.9)
Alkaline Phosphatase: 73 [IU]/L (ref 44–121)
BUN/Creatinine Ratio: 10 (ref 9–23)
BUN: 11 mg/dL (ref 6–20)
Bilirubin Total: <0.2 mg/dL (ref 0.0–1.2)
CO2: 24 mmol/L (ref 20–29)
Calcium: 9.9 mg/dL (ref 8.7–10.2)
Chloride: 104 mmol/L (ref 96–106)
Creatinine, Ser: 1.07 mg/dL — ABNORMAL HIGH (ref 0.57–1.00)
Globulin, Total: 3.1 g/dL (ref 1.5–4.5)
Glucose: 83 mg/dL (ref 70–99)
Potassium: 4.5 mmol/L (ref 3.5–5.2)
Sodium: 142 mmol/L (ref 134–144)
Total Protein: 7.5 g/dL (ref 6.0–8.5)
eGFR: 70 mL/min/{1.73_m2} (ref >59)

## 2023-02-25 LAB — ARTHRITIS PANEL
Anti Nuclear Antibody (ANA): NEGATIVE
Rheumatoid fact SerPl-aCnc: <10 [IU]/mL (ref <14.0)
Sed Rate: 20 mm/h (ref 0–32)
Uric Acid: 5.4 mg/dL (ref 2.6–6.2)

## 2023-02-25 LAB — HEMOGLOBIN A1C
Est. average glucose Bld gHb Est-mCnc: 123 mg/dL
Hgb A1c MFr Bld: 5.9 % — ABNORMAL HIGH (ref 4.8–5.6)

## 2023-02-26 ENCOUNTER — Encounter: Payer: Self-pay | Admitting: Nurse Practitioner

## 2023-02-26 MED ORDER — SUMATRIPTAN SUCCINATE 50 MG PO TABS
50.0000 mg | ORAL_TABLET | Freq: Every day | ORAL | 1 refills | Status: DC | PRN
Start: 2023-02-26 — End: 2023-03-25

## 2023-03-25 ENCOUNTER — Ambulatory Visit (INDEPENDENT_AMBULATORY_CARE_PROVIDER_SITE_OTHER): Payer: 59 | Admitting: Nurse Practitioner

## 2023-03-25 ENCOUNTER — Encounter: Payer: Self-pay | Admitting: Nurse Practitioner

## 2023-03-25 VITALS — BP 134/82 | HR 116 | Ht 68.0 in | Wt 235.0 lb

## 2023-03-25 DIAGNOSIS — Z01419 Encounter for gynecological examination (general) (routine) without abnormal findings: Secondary | ICD-10-CM

## 2023-03-25 DIAGNOSIS — N912 Amenorrhea, unspecified: Secondary | ICD-10-CM | POA: Diagnosis not present

## 2023-03-25 NOTE — Progress Notes (Signed)
   Brooke Howell 1990-07-05 [id number]   History:  33 y.o. [id number] presents for annual exam. No GYN complaints. Stopped Depo in October, has not had menses since. 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 History of syphilis, chlamydia. History of recurrent BV. Prediabetes, HLD managed by PCP. Working on diet.    Gynecologic History No LMP recorded (lmp unknown). Patient has had an injection.   Contraception/Family planning: abstinence Sexually active: No  Health Maintenance Last Pap: 10/16/2021. Results were: Normal neg HPV Last mammogram: 2017 (left only). Results were: benign biopsy Last colonoscopy: Not indicated Last Dexa: Not indicated  Past medical history, past surgical history, family history and social history were all reviewed and documented in the EPIC chart. 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   ROS:  A ROS was performed and pertinent positives and negatives are included.  Exam:  Vitals:   03/25/23 1421  BP: 134/82  Pulse: (!) 116  SpO2: 97%  Weight: 235 lb (106.6 kg)  Height: 5\' 8"  (1.727 m)     Body mass index is 35.73 kg/m.  General appearance:  Normal Thyroid:  Symmetrical, normal in size, without palpable masses or nodularity. Respiratory  Auscultation:  Clear without wheezing or rhonchi Cardiovascular  Auscultation:  Regular rate, without rubs, murmurs or gallops  Edema/varicosities:  Not grossly evident Abdominal  Soft,nontender, without masses, guarding or rebound.  Liver/spleen:  No organomegaly noted  Hernia:  None appreciated  Skin  Inspection:  Grossly normal Breasts: Examined lying and sitting.   Right: Without masses, retractions, nipple discharge or axillary adenopathy.   Left: Without masses, retractions, nipple discharge or axillary adenopathy. Pelvic: External genitalia:  no lesions              Urethra:  normal appearing urethra with no masses, tenderness or  lesions              Bartholins and Skenes: normal                 Vagina: normal appearing vagina with normal color and discharge, no lesions              Cervix: no lesions Bimanual Exam:  Uterus:  no masses or tenderness              Adnexa: no mass, fullness, tenderness              Rectovaginal: Deferred              Anus:  normal, no lesions  Patient informed chaperone available to be present for breast and pelvic exam. Patient has requested no chaperone to be present. Patient has been advised what will be completed during breast and pelvic exam.   Assessment/Plan:  33 y.o. [id number] for annual exam.   Well female exam with routine gynecological exam - Education provided on SBEs, importance of preventative screenings, current guidelines, high calcium diet, regular exercise, and multivitamin daily. Labs with PCP.   Amenorrhea - has not had period since stopping Depo 10/2022. Reassured this is normal. If no menses in 3-4 months can do provera.   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 Will repeat at 5-year interval per guidelines.   Return in about 1 year (around 03/24/2024) for Annual.    Olivia Mackie DNP, 3:08 PM 03/25/2023

## 2023-04-14 ENCOUNTER — Encounter: Payer: Self-pay | Admitting: Nurse Practitioner

## 2023-04-14 ENCOUNTER — Ambulatory Visit: Attending: Nurse Practitioner | Admitting: Nurse Practitioner

## 2023-04-14 ENCOUNTER — Other Ambulatory Visit: Payer: Self-pay | Admitting: Nurse Practitioner

## 2023-04-14 VITALS — BP 108/74 | HR 110 | Resp 19 | Ht 68.0 in | Wt 236.6 lb

## 2023-04-14 DIAGNOSIS — F419 Anxiety disorder, unspecified: Secondary | ICD-10-CM

## 2023-04-14 DIAGNOSIS — M778 Other enthesopathies, not elsewhere classified: Secondary | ICD-10-CM

## 2023-04-14 MED ORDER — IBUPROFEN 800 MG PO TABS: 800.0000 mg | ORAL_TABLET | Freq: Three times a day (TID) | ORAL | 0 refills | Status: DC | PRN

## 2023-04-14 NOTE — Progress Notes (Signed)
 I have seen and examined this patient with the advanced practice provider STUDENT and agree with the note below
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
## 2023-04-20 ENCOUNTER — Ambulatory Visit: Admitting: Physician Assistant

## 2023-04-20 ENCOUNTER — Other Ambulatory Visit: Payer: Self-pay

## 2023-04-20 ENCOUNTER — Encounter: Payer: Self-pay | Admitting: Physician Assistant

## 2023-04-20 DIAGNOSIS — M654 Radial styloid tenosynovitis [de Quervain]: Secondary | ICD-10-CM | POA: Diagnosis not present

## 2023-04-20 MED ORDER — METHYLPREDNISOLONE ACETATE 40 MG/ML IJ SUSP
13.3300 mg | INTRAMUSCULAR | Status: AC | PRN
  Administered 2023-04-20: 13.33 mg

## 2023-04-20 MED ORDER — BUPIVACAINE HCL 0.25 % IJ SOLN
0.3300 mL | INTRAMUSCULAR | Status: AC | PRN
  Administered 2023-04-20: .33 mL

## 2023-04-20 MED ORDER — LIDOCAINE HCL 1 % IJ SOLN
0.3000 mL | INTRAMUSCULAR | Status: AC | PRN
  Administered 2023-04-20: .3 mL
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
## 2023-04-26 DIAGNOSIS — E782 Mixed hyperlipidemia: Secondary | ICD-10-CM | POA: Diagnosis not present

## 2023-04-26 DIAGNOSIS — R5382 Chronic fatigue, unspecified: Secondary | ICD-10-CM | POA: Diagnosis not present

## 2023-04-26 DIAGNOSIS — Z1331 Encounter for screening for depression: Secondary | ICD-10-CM | POA: Diagnosis not present

## 2023-04-26 DIAGNOSIS — E8889 Other specified metabolic disorders: Secondary | ICD-10-CM | POA: Diagnosis not present

## 2023-04-26 DIAGNOSIS — R7303 Prediabetes: Secondary | ICD-10-CM | POA: Diagnosis not present

## 2023-04-26 DIAGNOSIS — Z6836 Body mass index (BMI) 36.0-36.9, adult: Secondary | ICD-10-CM | POA: Diagnosis not present

## 2023-05-11 DIAGNOSIS — E8889 Other specified metabolic disorders: Secondary | ICD-10-CM | POA: Diagnosis not present

## 2023-05-11 DIAGNOSIS — E782 Mixed hyperlipidemia: Secondary | ICD-10-CM | POA: Diagnosis not present

## 2023-05-11 DIAGNOSIS — R5382 Chronic fatigue, unspecified: Secondary | ICD-10-CM | POA: Diagnosis not present

## 2023-05-11 DIAGNOSIS — R7303 Prediabetes: Secondary | ICD-10-CM | POA: Diagnosis not present

## 2023-05-25 DIAGNOSIS — E782 Mixed hyperlipidemia: Secondary | ICD-10-CM | POA: Diagnosis not present

## 2023-05-25 DIAGNOSIS — R5382 Chronic fatigue, unspecified: Secondary | ICD-10-CM | POA: Diagnosis not present

## 2023-05-25 DIAGNOSIS — E8889 Other specified metabolic disorders: Secondary | ICD-10-CM | POA: Diagnosis not present

## 2023-05-25 DIAGNOSIS — R7303 Prediabetes: Secondary | ICD-10-CM | POA: Diagnosis not present

## 2023-06-15 DIAGNOSIS — E782 Mixed hyperlipidemia: Secondary | ICD-10-CM | POA: Diagnosis not present

## 2023-06-15 DIAGNOSIS — R5382 Chronic fatigue, unspecified: Secondary | ICD-10-CM | POA: Diagnosis not present

## 2023-06-15 DIAGNOSIS — E6609 Other obesity due to excess calories: Secondary | ICD-10-CM | POA: Diagnosis not present

## 2023-06-15 DIAGNOSIS — R7303 Prediabetes: Secondary | ICD-10-CM | POA: Diagnosis not present

## 2023-07-14 ENCOUNTER — Encounter: Payer: Self-pay | Admitting: Nurse Practitioner

## 2023-07-14 ENCOUNTER — Ambulatory Visit: Admitting: Nurse Practitioner

## 2023-07-14 VITALS — BP 122/78 | HR 100 | Temp 98.1°F | Wt 241.0 lb

## 2023-07-14 DIAGNOSIS — N898 Other specified noninflammatory disorders of vagina: Secondary | ICD-10-CM | POA: Diagnosis not present

## 2023-07-14 DIAGNOSIS — N760 Acute vaginitis: Secondary | ICD-10-CM

## 2023-07-14 LAB — WET PREP FOR TRICH, YEAST, CLUE

## 2023-07-14 MED ORDER — METRONIDAZOLE 500 MG PO TABS: 500.0000 mg | ORAL_TABLET | Freq: Two times a day (BID) | ORAL | 0 refills | Status: DC

## 2023-07-14 MED ORDER — FLUCONAZOLE 150 MG PO TABS: 150.0000 mg | ORAL_TABLET | ORAL | 0 refills | Status: DC

## 2023-07-14 NOTE — Progress Notes (Signed)
   Acute Office Visit  Subjective:    Patient ID: Brooke Howell, female    DOB: 09-Apr-1990, 33 y.o.   MRN: [id number]   HPI 33 y.o. presents today for vaginal odor and discharge x 1 week. Denies itching or irritation. Started Lume 1-2 weeks ago. Not sexually active.   No LMP recorded. (Menstrual status: Other).    Review of Systems  Constitutional: Negative.   Genitourinary:  Positive for vaginal discharge. Negative for vaginal pain.       Vaginal odor       Objective:    Physical Exam Constitutional:      Appearance: Normal appearance.  Genitourinary:    General: Normal vulva.     Vagina: Vaginal discharge present. No erythema.     Cervix: Normal.     BP 122/78   Pulse 100   Temp 98.1 F (36.7 C)   Wt 241 lb (109.3 kg)   SpO2 97%   BMI 36.64 kg/m  Wt Readings from Last 3 Encounters:  07/14/23 241 lb (109.3 kg)  04/14/23 236 lb 9.6 oz (107.3 kg)  03/25/23 235 lb (106.6 kg)        Patient informed chaperone available to be present for breast and pelvic exam. Patient has requested no chaperone to be present. Patient has been advised what will be completed during breast and pelvic exam.   Wet prep + clue cells (+ odor)  Assessment & Plan:   Problem List Items Addressed This Visit   None Visit Diagnoses       Bacterial vaginosis    -  Primary   Relevant Medications   metroNIDAZOLE  (FLAGYL ) 500 MG tablet        Acute vaginitis       Relevant Medications   fluconazole  (DIFLUCAN ) 150 MG tablet   Other Relevant Orders   WET PREP FOR TRICH, YEAST, CLUE      Plan: Wet prep positive for clue cells - Flagyl  500 mg BID x 7 days. Stop Lume. Requests Diflucan  as she tends to get yeast infections with BV treatment.   Return if symptoms worsen or fail to improve.    Annabella DELENA Shutter DNP, 2:54 PM 07/14/2023

## 2023-07-26 DIAGNOSIS — E782 Mixed hyperlipidemia: Secondary | ICD-10-CM | POA: Diagnosis not present

## 2023-07-26 DIAGNOSIS — Z6836 Body mass index (BMI) 36.0-36.9, adult: Secondary | ICD-10-CM | POA: Diagnosis not present

## 2023-07-26 DIAGNOSIS — R5382 Chronic fatigue, unspecified: Secondary | ICD-10-CM | POA: Diagnosis not present

## 2023-07-26 DIAGNOSIS — E66812 Obesity, class 2: Secondary | ICD-10-CM | POA: Diagnosis not present

## 2023-07-26 DIAGNOSIS — R7303 Prediabetes: Secondary | ICD-10-CM | POA: Diagnosis not present

## 2023-07-26 DIAGNOSIS — E559 Vitamin D deficiency, unspecified: Secondary | ICD-10-CM | POA: Diagnosis not present

## 2023-08-24 DIAGNOSIS — R7303 Prediabetes: Secondary | ICD-10-CM | POA: Diagnosis not present

## 2023-08-24 DIAGNOSIS — E782 Mixed hyperlipidemia: Secondary | ICD-10-CM | POA: Diagnosis not present

## 2023-08-24 DIAGNOSIS — R5382 Chronic fatigue, unspecified: Secondary | ICD-10-CM | POA: Diagnosis not present

## 2023-08-24 DIAGNOSIS — E559 Vitamin D deficiency, unspecified: Secondary | ICD-10-CM | POA: Diagnosis not present

## 2023-09-06 ENCOUNTER — Encounter: Payer: Self-pay | Admitting: Nurse Practitioner

## 2023-09-06 ENCOUNTER — Ambulatory Visit: Admitting: Nurse Practitioner

## 2023-09-06 VITALS — BP 130/64 | HR 106 | Wt 238.0 lb

## 2023-09-06 DIAGNOSIS — R102 Pelvic and perineal pain: Secondary | ICD-10-CM

## 2023-09-06 DIAGNOSIS — Z3042 Encounter for surveillance of injectable contraceptive: Secondary | ICD-10-CM | POA: Insufficient documentation

## 2023-09-06 DIAGNOSIS — N926 Irregular menstruation, unspecified: Secondary | ICD-10-CM

## 2023-09-06 LAB — PREGNANCY, URINE: Preg Test, Ur: NEGATIVE

## 2023-09-06 MED ORDER — MEDROXYPROGESTERONE ACETATE 150 MG/ML IM SUSP
150.0000 mg | Freq: Once | INTRAMUSCULAR | Status: AC
  Administered 2023-09-06: 150 mg via INTRAMUSCULAR

## 2023-09-06 NOTE — Progress Notes (Signed)
   Acute Office Visit  Subjective:    Patient ID: Brooke Howell, female    DOB: 01-Dec-1990, 33 y.o.   MRN: [id number]   HPI 33 y.o. presents today for cramping. Believes it is related to stopping Depo a year ago and menses trying to start back. Has not had period since stopping Depo but had light bleeding a couple of days ago. Last dose 07/14/22. Not sexually active. Wants to restart Depo.   No LMP recorded. (Menstrual status: Other).    Review of Systems  Constitutional: Negative.   Genitourinary:  Positive for pelvic pain (Cramping) and vaginal bleeding (Spotting).       Objective:    Physical Exam Constitutional:      Appearance: Normal appearance.   GU: Not indicated  BP 130/64   Pulse (!) 106   Wt 238 lb (108 kg)   SpO2 97%   BMI 36.19 kg/m  Wt Readings from Last 3 Encounters:  09/06/23 238 lb (108 kg)  07/14/23 241 lb (109.3 kg)  04/14/23 236 lb 9.6 oz (107.3 kg)        UPT neg  Assessment & Plan:   Problem List Items Addressed This Visit       Other   Encounter for surveillance of injectable contraceptive - Primary   Relevant Orders   Pregnancy, urine   Plan: Neg UPT. Depo provided today.     Annabella DELENA Shutter DNP, 12:23 PM 09/06/2023

## 2023-11-15 ENCOUNTER — Encounter: Payer: Self-pay | Admitting: Radiology

## 2024-01-04 ENCOUNTER — Ambulatory Visit (INDEPENDENT_AMBULATORY_CARE_PROVIDER_SITE_OTHER): Payer: Self-pay | Admitting: Obstetrics and Gynecology

## 2024-01-04 ENCOUNTER — Encounter: Payer: Self-pay | Admitting: Obstetrics and Gynecology

## 2024-01-04 VITALS — BP 118/74 | HR 129 | Temp 98.2°F | Wt 231.0 lb

## 2024-01-04 DIAGNOSIS — B3731 Acute candidiasis of vulva and vagina: Secondary | ICD-10-CM

## 2024-01-04 LAB — WET PREP FOR TRICH, YEAST, CLUE

## 2024-01-04 MED ORDER — FLUCONAZOLE 150 MG PO TABS: 150.0000 mg | ORAL_TABLET | Freq: Once | ORAL | 1 refills | Status: AC
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
## 2024-02-24 ENCOUNTER — Ambulatory Visit: Payer: Self-pay | Admitting: Nurse Practitioner
